# Patient Record
Sex: Female | Born: 1955 | Race: White | Hispanic: No | Smoking: Former smoker
Health system: Southern US, Academic
[De-identification: ages and names within clinical notes are randomized; demographics above are authoritative.]

## PROBLEM LIST (undated history)

## (undated) DIAGNOSIS — R269 Unspecified abnormalities of gait and mobility: Secondary | ICD-10-CM

## (undated) DIAGNOSIS — M2669 Other specified disorders of temporomandibular joint: Secondary | ICD-10-CM

## (undated) DIAGNOSIS — M353 Polymyalgia rheumatica: Secondary | ICD-10-CM

## (undated) DIAGNOSIS — K8689 Other specified diseases of pancreas: Secondary | ICD-10-CM

## (undated) DIAGNOSIS — K589 Irritable bowel syndrome without diarrhea: Secondary | ICD-10-CM

## (undated) DIAGNOSIS — M797 Fibromyalgia: Secondary | ICD-10-CM

## (undated) DIAGNOSIS — E559 Vitamin D deficiency, unspecified: Secondary | ICD-10-CM

## (undated) DIAGNOSIS — R5383 Other fatigue: Secondary | ICD-10-CM

## (undated) DIAGNOSIS — Z9889 Other specified postprocedural states: Secondary | ICD-10-CM

## (undated) DIAGNOSIS — E782 Mixed hyperlipidemia: Secondary | ICD-10-CM

## (undated) DIAGNOSIS — G8929 Other chronic pain: Secondary | ICD-10-CM

## (undated) DIAGNOSIS — E871 Hypo-osmolality and hyponatremia: Secondary | ICD-10-CM

## (undated) DIAGNOSIS — F411 Generalized anxiety disorder: Secondary | ICD-10-CM

## (undated) DIAGNOSIS — M1990 Unspecified osteoarthritis, unspecified site: Secondary | ICD-10-CM

## (undated) DIAGNOSIS — K63829 Intestinal methanogen overgrowth, unspecified: Secondary | ICD-10-CM

## (undated) DIAGNOSIS — K219 Gastro-esophageal reflux disease without esophagitis: Secondary | ICD-10-CM

## (undated) DIAGNOSIS — R531 Weakness: Secondary | ICD-10-CM

## (undated) HISTORY — DX: Mixed hyperlipidemia: E78.2

## (undated) HISTORY — PX: ADENOIDECTOMY: SUR15

## (undated) HISTORY — DX: Generalized anxiety disorder: F41.1

## (undated) HISTORY — DX: Polymyalgia rheumatica: M35.3

## (undated) HISTORY — DX: Weakness: R53.1

## (undated) HISTORY — PX: HX TUBAL LIGATION: SHX77

## (undated) HISTORY — PX: DENTAL SURGERY: SHX609

## (undated) HISTORY — DX: Vitamin D deficiency, unspecified: E55.9

## (undated) HISTORY — DX: Other specified disorders of temporomandibular joint: M26.69

## (undated) HISTORY — PX: HX GALL BLADDER SURGERY/CHOLE: SHX55

## (undated) HISTORY — DX: Hypo-osmolality and hyponatremia: E87.1

## (undated) HISTORY — PX: HX TONSILLECTOMY: SHX27

## (undated) HISTORY — PX: COLONOSCOPY: SHX174

## (undated) HISTORY — DX: Other fatigue: R53.83

## (undated) HISTORY — PX: SINUS SURGERY: SHX187

## (undated) HISTORY — DX: Unspecified osteoarthritis, unspecified site: M19.90

## (undated) HISTORY — PX: BREAST SURGERY: SHX581

## (undated) HISTORY — DX: Other specified postprocedural states: Z98.890

## (undated) HISTORY — PX: HX HIP REPLACEMENT: SHX124

## (undated) HISTORY — PX: HX CATARACT REMOVAL: SHX102

## (undated) HISTORY — PX: HX APPENDECTOMY: SHX54

## (undated) HISTORY — DX: Gastro-esophageal reflux disease without esophagitis: K21.9

## (undated) HISTORY — DX: Unspecified abnormalities of gait and mobility: R26.9

## (undated) HISTORY — DX: Irritable bowel syndrome, unspecified: K58.9

---

## 1997-01-27 ENCOUNTER — Other Ambulatory Visit (HOSPITAL_COMMUNITY): Payer: Self-pay | Admitting: OBSTETRICS/GYNECOLOGY

## 2015-08-03 IMAGING — CR XRAY CERVICAL SPINE MINIMUM 4 VIEWS
1 series · 7 of 7 positions shown · non-contrast
Comparison: none

Exam:   

Cervical spine 5V
INDICATION: Neck pain.

[Series 4: view not recorded · 0.17mm/px · 7 of 7 slices shown]
[im 1/7]
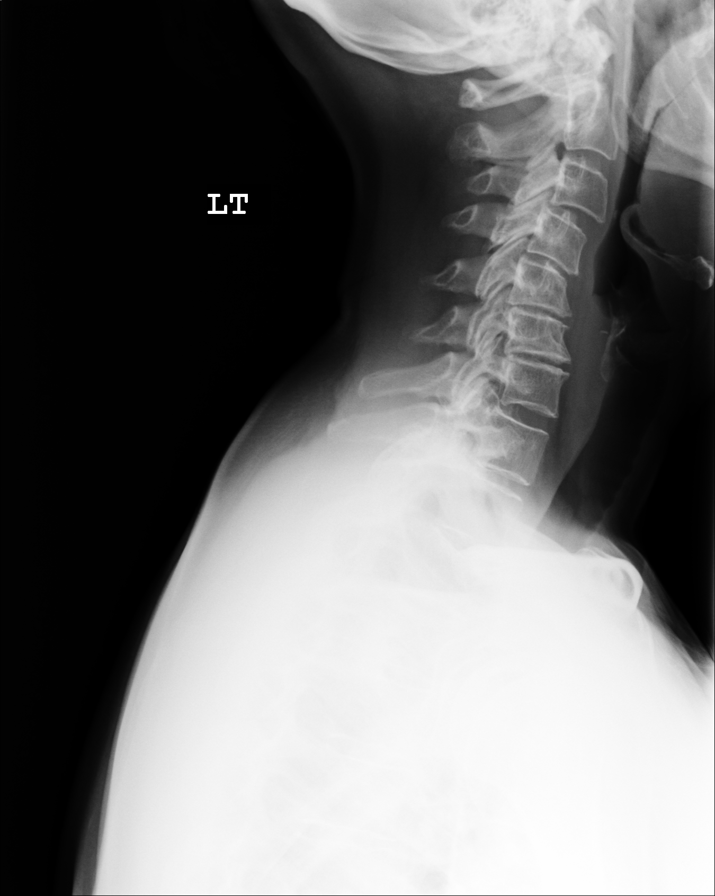
[im 2/7]
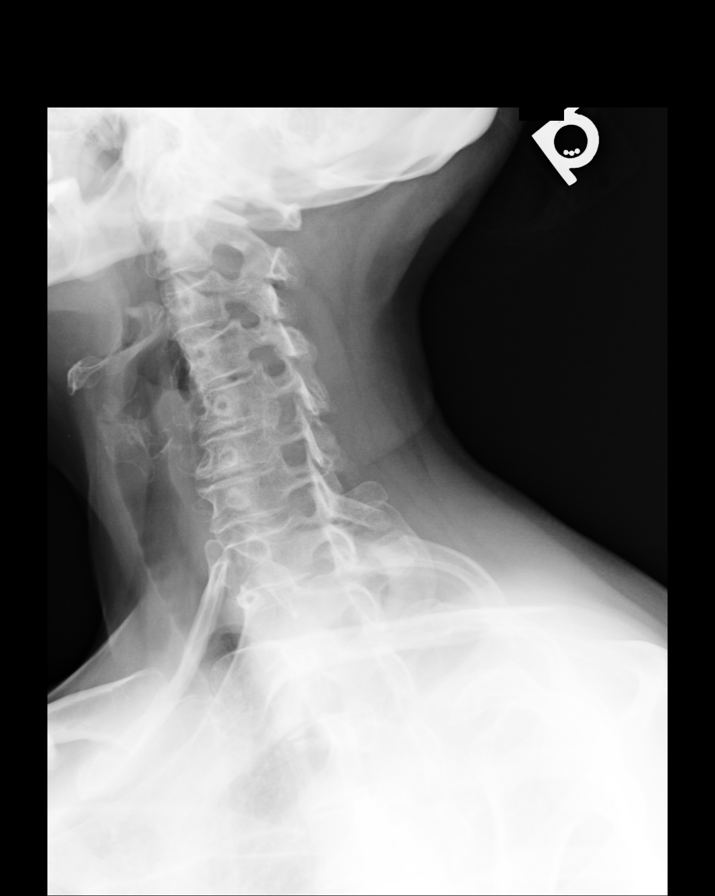
[im 3/7]
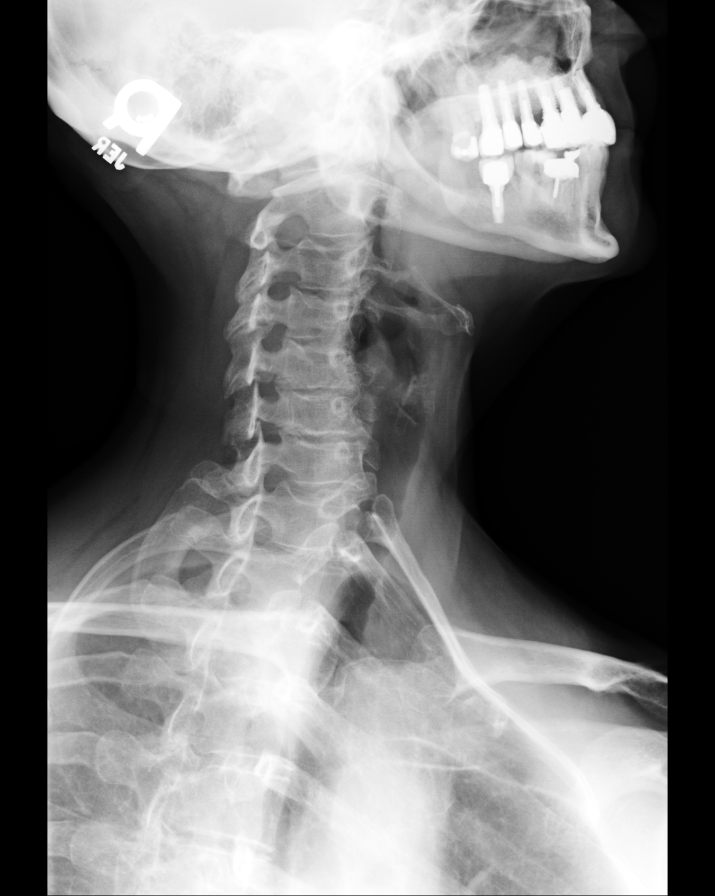
[im 4/7]
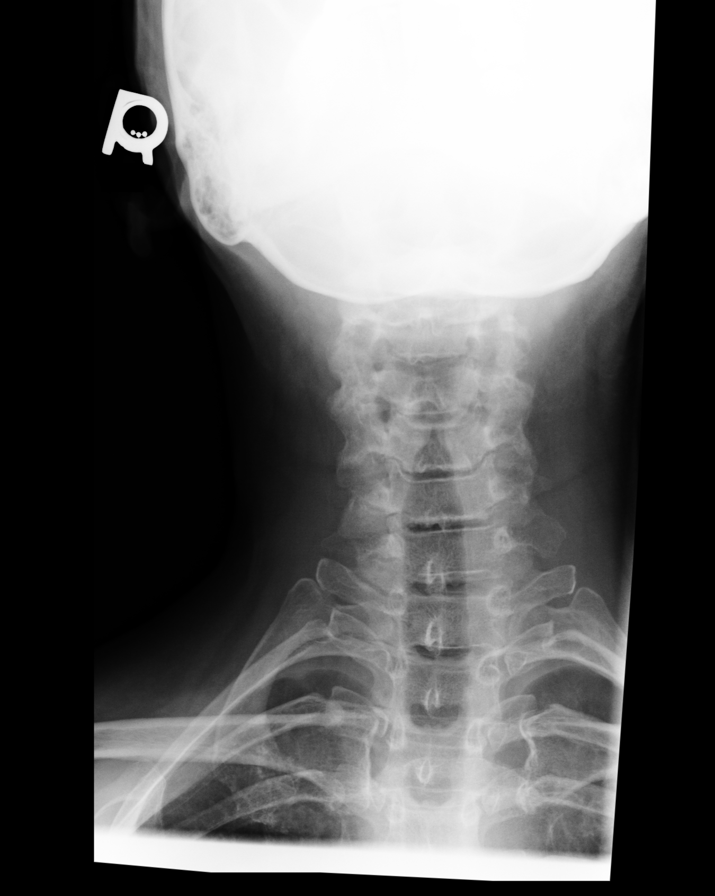
[im 5/7]
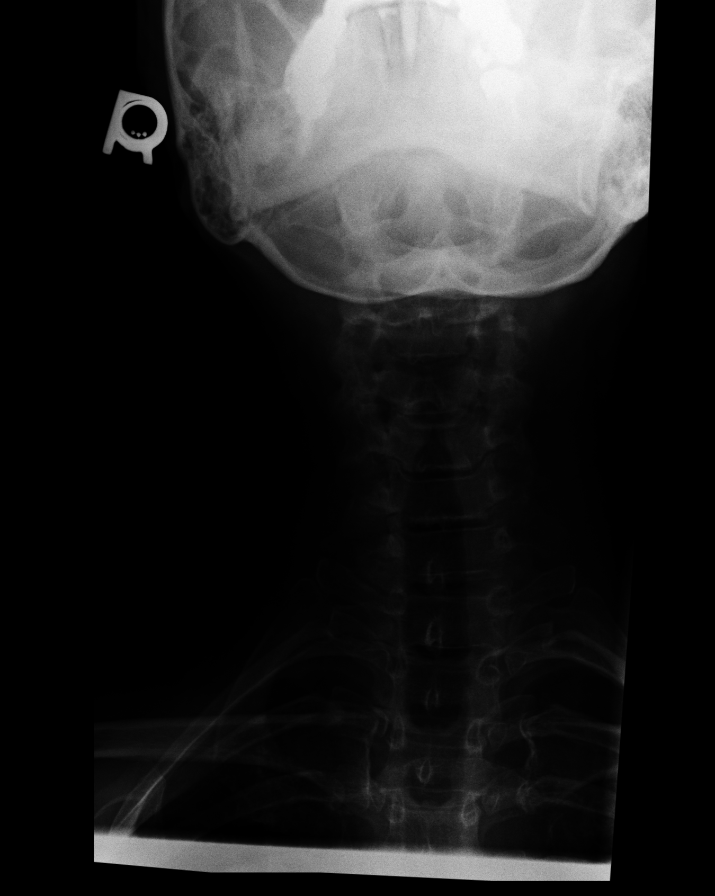
[im 6/7]
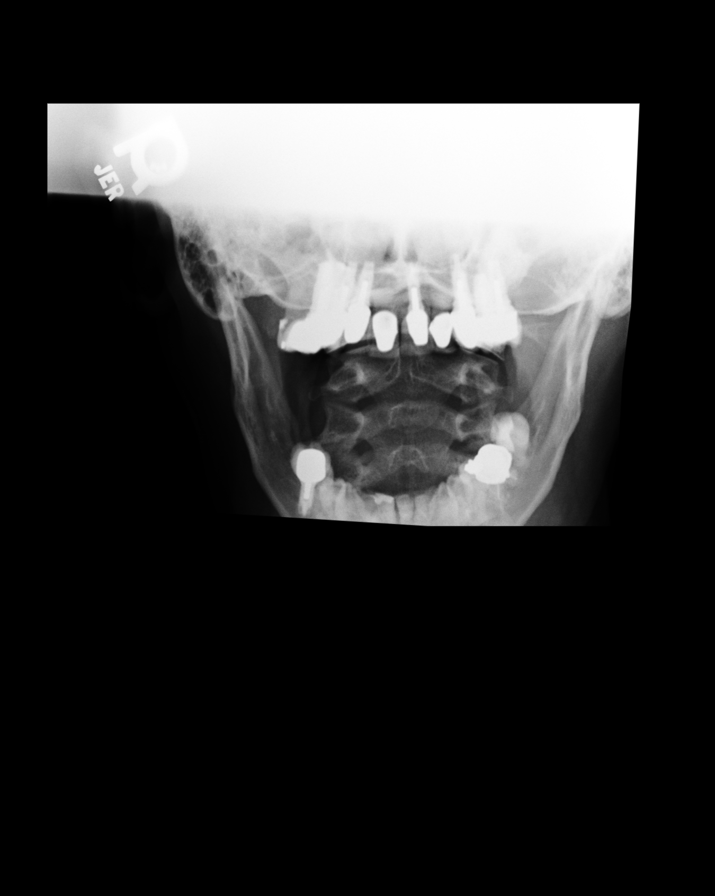
[im 7/7]
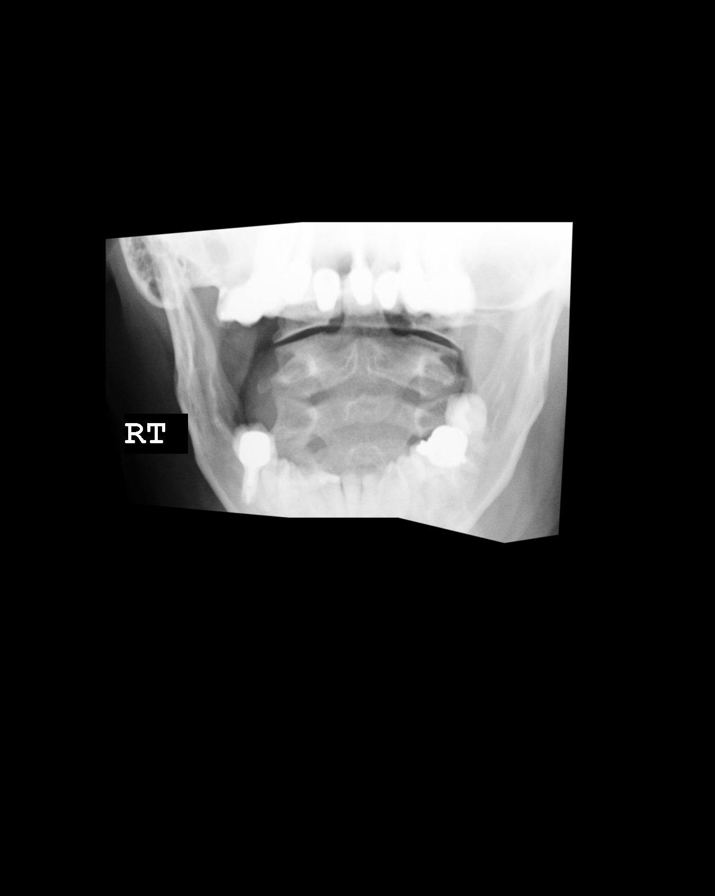

[7 of 7 positions shown; findings below may reference images not displayed]

FINDINGS: Multiple views of the patient’s cervical spine shows on lateral projection all seven cervical vertebrae. There is reversal of curvature of the cervical spine from C3 to C7. Disc space narrowing at C4-5, C5-6 and C6-7 is observed. There is end plate sclerosis with hypertrophic bone formation at the discovertebral margins. No evidence for a fracture is observed. No prevertebral soft tissue swelling is seen. Oblique views shows no evidence for a locked facet. No significant osteophytes are seen impinging the cervical foramina. The anterior posterior views shows C1 centered on C2.
IMPRESSION: Reversal of curvature C3 through C7.

Disc space narrowing C4-5. C5-6 and C6-7.

No fracture or subluxation. 

No evidence for a locked facet.

## 2018-02-01 NOTE — Patient Instructions (Signed)
 Take one Lactaid pill when you eat any dairy products.    Take on PEPCID Complete as needed for acid

## 2018-10-15 NOTE — Telephone Encounter (Signed)
 Would offer repeat HBT as discussed by Dr. Dena.   If positive, happy to treat as she was previously.   Otherwise can get her in to be seen.   Just let me know and happy to order if she desires.    Thanks!     Electronically signed by: Emeline Norleen Speck, MD  10/15/18 [phone]

## 2018-10-16 NOTE — Telephone Encounter (Signed)
 Ok.  Thank you.   I placed the order.   Thanks      Electronically signed by: Emeline Norleen Speck, MD  10/16/18 1141

## 2018-11-25 NOTE — Progress Notes (Signed)
 Delon,   Can you please let her know the HBT was positive and I sent flagyl  and neomycin  to pharmacy.   Thanks  Technical sales engineer by: Emeline Norleen Speck, MD, Attending Physician  11/25/2018 9:59 AM      Electronically signed by: Emeline Norleen Speck, MD  11/25/18 [phone]

## 2018-11-25 NOTE — Telephone Encounter (Signed)
 Noted.   I do see prior 30 day rx  Sent in a refill on 14d rx that she can continue to do a 4 week course.   Follow-up in 2-3 months should be adequate. With APP should be reasonable as well.  I've never seen her - former Dena pt.      Electronically signed by: Emeline Norleen Speck, MD  11/25/18 [phone]

## 2019-02-11 NOTE — Progress Notes (Signed)
 GI Clinic Return Patient Visit  CC: SIBO     HPI:  Sonya Parks is a 64 y.o. year old female who is seen in follow-up for SIBO.     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 Noted ~50% improvement in SIBO symptoms s/p use with some continued minimal improvement since that time. Taking gas-x and noted that she has gone back to an extremely strict diet with some improvement in these symptoms. No red meat, no processed foods, no dairy. Reports she is still having deep belching, abdominal bloating and flatulence. States with bloating she notes some gas pain in lower abdomen. Endorses good appetite levels.Patient states they are having 3-5 bowel movements daily, bristol 5-7. Patient denies hematochezia, melena, steatorrhea, nausea, vomiting, dysphagia or unintentional weight loss.  States she is trying to make sure bowels stay open no matter what. - taking Milk of Mg nightly currently. Patient states prior to this - ie: last year- she was having formed but hard stools that were difficult to produce. Previous trials of linzess and amitiza. Notes she did pretty well on Amitiza but she had a change in insurance so needed to transition to Linzess. Had significant diarrhea with this. Miralax without benefit. No previous consistent trial of soluble fiber. Patient was previously on PPI and QHS Pepcid. States she self-discontinued PPI after reading AEs. States she will have intermittent reflux occurring once every 4-6 weeks. States this is responsive to mylanta. Continues to take Pepcid QHS with benefit in chronic symptoms. Also reports historical benefit in SIBO symptoms with intermittent ABX given for dental issues (Clindamycin ) - inquires into longer term erythromycin  dosing for SIBO therapy given research she has done on treatment by specialist in [place] .     Flat red dots that she has noted on and off starting ~2 years ago.  States that most recently came back In November prior to last ABX course for SIBO. Notes these occur on her distal extremities. States these do not itch. Will have episodes of bleeding when shaving.  Also reports this spotting within her mouth in addition to a large white spot on side of tongue. Reports dry mouth, eyes and throat.     Patient reports that her last colonoscopy was ~2010 but noted that they were unable to complete this 2/2 inability to get in. this was at an OSH - report is not available for review. Notes from Dr. Dena mention this was negative - will request for review. Patient denies any known family history of GI diseases or malignancies. She denies tobacco use. Denies alcohol .     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    Other:   HBT 11/20/18: Diagnostic for small intestinal bacterial overgrowth in a patient colonized with methanogenic bacteria.  HBT 12/20/17: neg  HBT 10/08/17: Diagnostic for severe bacterial overgrowth with methanogenic bacteria.  HBT 06/20/17: Diagnostic for bacterial overgrowth in a patient colonized with methanogenic bacteria.    ROS:  A complete review of systems was otherwise negative, except as noted in the HPI.    PMH:  Past Medical History:   Diagnosis Date   . Disorder of sphincter of Oddi    . GERD (gastroesophageal reflux disease)    . Irritable bowel syndrome    . Small intestinal bacterial  overgrowth        PSH:   Past Surgical History:   Procedure Laterality Date   . APPENDECTOMY     . BREAST IMPLANT PLACEMENT     . BREAST IMPLANT REMOVAL     . CHOLECYSTECTOMY     . DENTAL SURGERY      DENTAL IMPLANTS   . OVARIAN CYST SURGERY     . TONSILLECTOMY AND ADENOIDECTOMY          SOCIAL:  Social History     Socioeconomic History   . Marital status: Legally Separated     Spouse name: None   . Number of children: None   . Years of education: None   . Highest education level: None   Occupational History   . None   Social Needs   . Financial resource strain: None   . Food insecurity     Worry: None     Inability: None   . Transportation needs     Medical: None     Non-medical: None   Tobacco Use   . Smoking status: Former Smoker     Packs/day: 0.50     Years: 30.00     Pack years: 15.00     Types: Cigarettes     Quit date: 10/21/2003     Years since quitting: 15.3   . Smokeless tobacco: Never Used   Substance and Sexual Activity   . Alcohol  use: No   . Drug use: No   . Sexual activity: None   Lifestyle   . Physical activity     Days per week: None     Minutes per session: None   . Stress: None   Relationships   . Social Wellsite geologist on phone: None     Gets together: None     Attends religious service: None     Active member of club or organization: None     Attends meetings of clubs or organizations: None     Relationship status: None   Other Topics Concern   . None   Social History Narrative   . None       FH: No GI or liver disease.   family history includes Cholecystitis in her mother; Osteoarthritis in her maternal grandmother and mother; Thyroid  disease in her daughter, maternal aunt, and paternal aunt.    MEDS:  Updated Medication List:          acetaminophen  (TYLENOL ) 650 MG CR tablet    Sig - Route: Take 1,300 mg by mouth every 8 (eight) hours as needed for Pain. - Oral    Class: Historical Med    AMABELZ  0.5-0.1 mg per tablet    Sig: daily.    Class: Historical Med  aspirin 81 MG EC tablet     Sig - Route: Take 81 mg by mouth daily. - Oral    Class: Historical Med    buPROPion  XL (WELLBUTRIN  XL) 300 MG 24 hr tablet    Sig: TAKE 1 TABLET BY MOUTH IN THE MORNING    Class: Historical Med    calcium carbonate (CALTRATE 600 ORAL)    Sig - Route: Take by mouth daily. - Oral    Class: Historical Med    celecoxib  (CELEBREX ) 200 MG capsule    Sig: TAKE 1 CAPSULE BY MOUTH ONCE DAILY    Class: Historical Med    chlorhexidine (PERIDEX) 0.12 % solution    Sig:      Class: Historical Med    cholecalciferol (VITAMIN D3) 1000 UNIT Tab    Sig - Route: Take 2,000 Units by mouth daily. - Oral    Class: Historical Med    cyclobenzaprine  HCl (CYCLOBENZAPRINE  ORAL)    Sig - Route: Take by mouth as needed. - Oral    Class: Historical Med    diclofenac (VOLTAREN) 1 % Gel gel    Sig - Route: Apply topically 4 times daily. - Topical    Class: Historical Med    DULoxetine  (CYMBALTA ) 60 MG capsule    Sig: daily.    Class: Historical Med    fluticasone propionate (FLONASE) 50 mcg/actuation nasal spray    Sig - Route: 1 spray by Nasal route daily. - Nasal    Class: Historical Med    LORazepam (ATIVAN) 0.5 MG tablet    Sig - Route: Take 0.5 mg by mouth as needed for Anxiety. - Oral    Class: Historical Med    mag-alum hydroxide-simethicone  (MAALOX PLUS EXTRA STRENGTH) 400-400-40 mg/5 mL suspension    Sig - Route: Take 5 mLs by mouth as needed. - Oral    Class: Historical Med    magnesium hydroxide (MILK OF MAGNESIA) 800 mg/5 mL suspension    Sig - Route: Take 5 mLs by mouth daily. - Oral    Class: Historical Med    meloxicam (MOBIC) 15 MG tablet    Sig - Route: Take 15 mg by mouth daily. Half tablet twice daily - Oral    Class: Historical Med    olopatadine 0.2 % Drop    Sig: INSTILL 1 DROP INTO EACH EYE IN THE MORNING    Class: Historical Med    pantoprazole  (PROTONIX ) 20 MG tablet    Sig: TAKE 1 TABLET BY MOUTH ONCE DAILY    Class: Historical Med    prednisoLONE acetate (PRED FORTE) 1 % ophthalmic suspension    Sig: INSTILL 1 DROP  INTO RIGHT EYE TWICE DAILY    Class: Historical Med    spironolactone (ALDACTONE) 50 MG tablet    Sig: daily.    Class: Historical Med    ubidecarenone (CO Q-10 ORAL)    Sig - Route: Take by mouth daily. - Oral    Class: Historical Med    XIIDRA  5 % ophthalmic solution    Sig:      Class: Historical Med            ALLERGIES:  is allergic to honey bee treatment [venom-honey bee]; mustard; and penicillin.    PHYSICAL:  BP 139/84   Pulse 89   Temp 97.9 F (36.6 C) (Temporal)   Ht 1.689 m (5' 6.5)   Wt 54.5 kg (120 lb 3.2 oz)   SpO2 100%   BMI 19.11  kg/m    General:  Sitting in chair in NAD  Eyes: EOMI, no scleral icterus  ENT: white plaque on L lateral tongue, OP clear  Cardiovascular:  Regular rate, no murmurs, no edema  Respiratory:  Clear to auscultation bilaterally, no wheezes or crackles, normal work of breathing  Abdomen:  Soft, nondistended, normoactive bowel sounds, tympanic to percussion  Skin/MSK: discrete, circumferential, red dotted rash measuring 3-10cm noted on all distal extremities, no jaundice, no CVA tenderness  Neuro:  alert and oriented x 3, no asterixis  Psych: pleasant affect, appropriate judgement    ASSESSMENT AND PLAN:   Kelse Ploch is a 64 y.o. 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 Constipation currently poorly managed via daily milk of Mg which could be contributing to underlying bloating symptoms. Will attempt to fill Rx fo Amitiza given historical benefit and AEs noted with Linzess - if insurance won't cover will trial Trulance. Will obtain repeat HBT for possible SIBO recurrence. Patient's rash unclear - appears she has had some autoimmune lab evaluations which have been unremarkable. Will obtain glucagon. Also recommended repeat colonoscopy as she is overdue for CRC screening. Plan is discussed with patient who expresses understanding and agreement. Risks, benefits  and alternatives to plan are discussed. All questions are answered.     PLAN:   - Amitiza 8mg    - Glucagon lab  - HBT  - Colonoscopy for CRC screening     RTC in 3 months     I spent 39 minutes total in relation to chart review, documentation and face-to-face interaction for today's visit. Greater than 50% of the patient encounter involved counseling on follow up plan, return instructions, risk factor reduction and patient and family eduction.    Electronically signed by:  Saddie Earnie Cole, PA-C, 02/11/2019 10:20 AM    Attending Physician Statement  I saw and evaluated Channing Leeroy [place] with Saddie Cole, PA-C.  I personally reviewed key points of the history and exam with the patient.  I have discussed the patient's management with [name], and I played a major role in the medical decision making.  I agree with history, exam, assessment and plan as written by [name].    Briefly, Ms. Oatis is a 64 y.o. female with history of IMO status post multiple antibiotic regimens including 4-week therapy of Flagyl  and neomycin  presenting in return.  She has ongoing mild bloating, significant constipation, and a skin rash.  We discussed her need for screening colonoscopy, and she agreed to proceed and we will order this today.  We also discussed repeat hydrogen breath testing to confirm eradication after her recent antibiotic regimen.  Recommend Amitiza initiation given suggestion that this may be concomitant irritable bowel syndrome with constipation.  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  If no etiology determined of skin rash on these tests, will refer to dermatology.             Electronically Signed by: Emeline Norleen Speck, MD, Attending Physician  02/11/2019 1:17 PM         Electronically signed by: Emeline Norleen Speck,  MD  02/11/19 1321

## 2019-03-27 NOTE — Telephone Encounter (Signed)
 Thanks!     Electronically signed by: Emeline Norleen Speck, MD  03/27/19 2146

## 2019-04-21 NOTE — Telephone Encounter (Signed)
 Ok.  Thank you.   Will place order for HBT.   Re food intolerances - I dont do that testing, and the data supporing them is not fantastic so I generally try to stay away from it.  Would check for SIBO first (with HBT) and we can discuss food intolerances and strategies to find triggers at our visit in May.    Thanks!     Electronically signed by: Emeline Norleen Speck, MD  04/21/19 1023

## 2019-05-20 NOTE — Patient Instructions (Signed)
 EGD and colonoscopy when able  Enteric coated peppermint oil - IBgard is OTC therapy  Discuss food sensitivities with dietary  Continue amitiza  Anorectal manometry to evaluate for outlet dysfunction as etiology of constipation.

## 2019-05-20 NOTE — Progress Notes (Signed)
 Gastroenterology Clinic Return Visit  HPI:  Sonya Parks is a 64 y.o. year old female who is seen in return for IMO/SIBO.  Most recently seen February 2021 (previous to this saw Dr. Dena) at which point she was status post multiple prior antibiotic regimens including Flagyl  and neomycin  for 4 weeks.  At that time she had ongoing bloating, constipation, and rash.  We suggested repeat HBT to confirm eradication and initiation of Amitiza given concomitant IBS-C.  Checked glucagon (normal) to exclude necrolytic migratory erythema from an occult glucagonoma and celiac studies were negative.  After her visit, her Amitiza was increased to 24 mg twice daily.  Call back in early April with bloating and belching with gas and fatigue.  She had a breath test performed yesterday but results are not available.    Today she has several concerns and reports that she is in many ways well, in some ways not so well.  She states she has been managing things better and tolerating a very restrictive diet.  She has taken Amitiza 24 mg twice daily and occasionally added an extra 8 mill to produce a bowel movement.  Despite this, she still has complete evacuation and less than ideal bowel habits.  She has tried to eat less fiber and also has performed intermittent fasting.  She did feel somewhat better after Cipro but had multiple side effects of this medication.  She still has bloating and distention with abdominal cramping.  She has no she has pain in her back when her constipation gets bad.  She also has a burning sensation in her epigastrium which radiates to her back prior to a bowel movement.  She has not had any blood in her stools or she had black stools.  She reports she may have had less brain fog with Cipro, but this did worsen her sciatica.  She has some benefit with Gas-X with regarding to her eructation and bloating.  Overall, she states her worst symptom is fatigue.  She has not been taking Protonix  but has been  taking Pepcid nightly.  She also asked about erythromycin  and the potential for this to help with bacterial overgrowth.    Exam:   BP 118/75   Pulse 92   Temp 97.3 F (36.3 C) (Temporal)   Ht 1.689 m (5' 6.5)   Wt 53.5 kg (118 lb)   SpO2 100%   BMI 18.76 kg/m      Alert and oriented x3, no acute distress  Female  Regular rate and rhythm  Soft nontender abdomen with mild tympany in epigastrium noted.  No lower extremity edema  No appreciable rashes    Assessment and Plan:  Sonya Parks is a 64 y.o. female with history of SIBO/IMO and IBS-C who presents in return for evaluation of bloating, distention, constipation, reflux, and fatigue.  We discussed contiuation of her Pepcid for reflux sypmtoms.  Would also consider EGD given upper abdominal pain and dyspepsia.  She is due for screening colonoscopy and this will help exclude obstructive obstructive pathology for constipation.  We will also pursue anorectal manometry given suggestion of pelvic floor dysfunction as well.  She will continue her current dose of Amitiza 24 mg twice daily and recommend that she start peppermint oil addition.  Given her multiple perceived food allergies, recommend she discuss with dietitian regarding ensuring she does not being too restrictive and becoming nutritionally deficient.  We also discussed food allergy testing and the lack of data behind this.  We also discussed my hesitancy with prescribing erythromycin  as she does not have documented gastroparesis number she had particular symptoms distant with delayed gastric emptying.  Will follow results of her HBT though we discussed my hesitation that the HBT was providing effective diagnostic results and if this was an etiology or a sequale of her symptoms.     This note was dictated in part with with voice recognition software.   Total time spent on the date of service was 7 pre chart/45 face to face/8 charting - 60 minutes.    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    Other:   HBT 03/03/19:  Increased hydrogen production to 12 ppm over baseline at 60 minutes with a secondary peak 17 ppm over baseline at 105 minutes. No significant change in methane production. Although this level of gas production does not reach the diagnostic cutoff for bacterialovergrowth of . 20 ppm over baseline at or before 1 hour, it suggests increased foregut bacterial colonization.  HBT 11/20/18: Diagnostic for small intestinal bacterial overgrowth in a patient colonized with methanogenic bacteria.  HBT 12/20/17: neg  HBT 10/08/17: Diagnostic for severe bacterial overgrowth with methanogenic bacteria.  HBT 06/20/17: Diagnostic for bacterial overgrowth in a patient colonized with methanogenic bacteria.    Labs  Heme labs:  Lab Results   Component Value Date    WBC 6.2 01/17/2018    HGB 15.9 01/17/2018    HCT 45.6 01/17/2018    MCV 94.9 01/17/2018    PLT 272 01/17/2018     No results found for: B12B  No results found for: IRON , TIBC, FERRITIN    Liver labs:  Lab Results   Component Value Date    ALT 41 01/17/2018    AST 29 01/17/2018    ALKPHOS 49 01/17/2018    BILITOT 0.5 01/17/2018     Lab Results   Component Value Date    HEPCAB Non-Reactive 01/17/2018     No results found for: INR, PROTIME    Misc labs:  No results found for: HBA1C, HGBA1C, PA1C  TISSUE TRANSGLUTAMINASE IGA ANTIBODY   Date Value Ref Range Status   02/11/2019 <1.2 <4.0 (Negative) U/mL Final     Comment:        Test Performed by:  [facility]  [address] [place], [place], [place] [address]  Lab Director: Elsie JUDITHANN Cumming M.D. Ph.D.; CLIA# [id number]     IGA   Date Value Ref Range Status   02/11/2019 85 70 - 350 MG/DL Final          Electronically signed by: Emeline Norleen Speck, MD  05/20/19 1154

## 2019-06-12 NOTE — Telephone Encounter (Signed)
 Thank you!!  -Jared     Electronically signed by: Emeline Norleen Speck, MD  06/12/19 [phone]

## 2019-06-16 NOTE — H&P (Addendum)
 Gastroenterology Preprocedural History and Physical        Chief Complaint/Reason for Procedure:  Sonya Parks is a 64 y.o. female scheduled for an EGD and Colonoscopy, for the following indication colon cancer screening, abdominal pain, gerd, bloating using deep sedation with propofol or general anesthesia as per anesthesia provider .    A History and Physical has been performed and patient medication allergies have been reviewed. The patient's tolerance of previous anesthesia has been reviewed. The risks and benefits of the procedure and the sedation options and risks were discussed with the patient. All questions were answered and informed consent obtained.  HPI  Patient Active Problem List    Diagnosis Date Noted   . SOB (shortness of breath) 10/18/2015   . Abdominal pain 10/21/2010     Past Medical History:   Diagnosis Date   . Disorder of sphincter of Oddi    . GERD (gastroesophageal reflux disease)    . Irritable bowel syndrome    . Small intestinal bacterial overgrowth       Past Surgical History:   Procedure Laterality Date   . APPENDECTOMY     . BREAST IMPLANT PLACEMENT     . BREAST IMPLANT REMOVAL     . CHOLECYSTECTOMY     . DENTAL SURGERY      DENTAL IMPLANTS   . OVARIAN CYST SURGERY     . TONSILLECTOMY AND ADENOIDECTOMY         BP 150/84   Pulse 89   Temp 97.7 F (36.5 C) (Skin)   Resp 14   Ht 1.676 m (5' 6)   Wt 56.7 kg (125 lb)   SpO2 96%   BMI 20.18 kg/m   Airway:  MALLAMPATI ONE    Heart:  normal S1 and S2  Lungs:  clear  Abdomen:  Positive bowel sounds. Abdomen soft, nondistended, nontender. No HSM, rebound or guarding.  Mental Status:  awake and alert; oriented to person, place, and time        Allergies   Allergen Reactions   . Honey Bee Treatment [Venom-Honey Bee] Swelling (ALLERGY/intolerance)   . Mustard Other (See Comments)     Swollen throat    . Penicillin Rash (ALLERGY/intolerance)     Prior to Admission medications    Medication Sig Start Date End Date Taking? Authorizing  Provider   AMABELZ  0.5-0.1 mg per tablet Take 1 tablet by mouth daily.    03/15/17  Yes Historical Provider, MD   aspirin 81 MG EC tablet Take 81 mg by mouth daily.   Yes Historical Provider, MD   buPROPion  XL (WELLBUTRIN  XL) 300 MG 24 hr tablet TAKE 1 TABLET BY MOUTH IN THE MORNING 08/23/18  Yes Historical Provider, MD   celecoxib  (CELEBREX ) 200 MG capsule Take 200 mg by mouth 3 times daily.    08/23/18  Yes Historical Provider, MD   cholecalciferol, vitamin D3, (VITAMIN D3) 50 mcg (2,000 unit) Tab tablet Take 2,000 Units by mouth daily.   Yes Historical Provider, MD   diclofenac (VOLTAREN) 1 % Gel gel Apply 2 g topically nightly.      Yes Historical Provider, MD   DULoxetine  (CYMBALTA ) 30 MG DR capsule Take 30 mg by mouth 3 times daily.   Yes Historical Provider, MD   famotidine (PEPCID) 20 MG tablet Take 20 mg by mouth at bedtime.    02/02/19  Yes Historical Provider, MD   fluticasone propionate (FLONASE) 50 mcg/actuation nasal spray 1 spray by Each Nare route  as needed for Rhinitis or Allergies.      Yes Historical Provider, MD   lovastatin  (MEVACOR ) 10 MG tablet TAKE 1 TABLET BY MOUTH ONCE DAILY 12/17/18  Yes Historical Provider, MD   lubiprostone (AMITIZA) 24 MCG capsule Take 1 capsule (24 mcg total) by mouth 2 times daily with meals. 06/04/19 09/02/19 Yes Emeline Norleen Speck, MD   lubiprostone (AMITIZA) 8 MCG capsule Take 1 capsule (8 mcg total) by mouth 2 times daily with meals. In addition to the 24 mcg capsule 06/12/19 09/10/19 Yes Emeline Norleen Speck, MD   mag-alum hydroxide-simethicone  (MAALOX PLUS EXTRA STRENGTH) 400-400-40 mg/5 mL suspension Take 5 mLs by mouth as needed.   Yes Historical Provider, MD   magnesium hydroxide (MILK OF MAGNESIA) 800 mg/5 mL suspension Take 5 mLs by mouth nightly.      Yes Historical Provider, MD   olopatadine 0.2 % Drop INSTILL 1 DROP INTO EACH EYE IN THE MORNING 01/18/18  Yes Historical Provider, MD   simethicone  (GAS-X ORAL) Take by mouth daily.   Yes Historical Provider, MD    spironolactone (ALDACTONE) 50 MG tablet Take 50 mg by mouth daily.    01/20/17  Yes Historical Provider, MD   XIIDRA  5 % ophthalmic solution Place 1 drop into both eyes 2 times daily.    02/15/18  Yes Historical Provider, MD   acetaminophen  (TYLENOL ) 650 MG CR tablet Take 1,300 mg by mouth every 8 (eight) hours as needed for Pain.    Historical Provider, MD   calcium carbonate (CALTRATE 600 ORAL) Take by mouth twice a week.       Historical Provider, MD   LORazepam (ATIVAN) 0.5 MG tablet Take 0.5 mg by mouth as needed for Anxiety.    Historical Provider, MD   meloxicam (MOBIC) 15 MG tablet Take 7.5 mg by mouth 2 (two) times daily as needed for Pain.       Historical Provider, MD   pantoprazole  (PROTONIX ) 20 MG tablet TAKE 1 TABLET BY MOUTH ONCE DAILY 08/23/18   Historical Provider, MD   ubidecarenone (CO Q-10 ORAL) Take by mouth daily.    Historical Provider, MD     Family History of:   Details   Colon Cancer   No    Inflammatory Bowel Disease No    Liver Disease   No      Social History     Socioeconomic History   . Marital status: Divorced     Spouse name: Not on file   . Number of children: Not on file   . Years of education: Not on file   . Highest education level: Not on file   Occupational History   . Not on file   Tobacco Use   . Smoking status: Former Smoker     Packs/day: 0.50     Years: 30.00     Pack years: 15.00     Types: Cigarettes     Quit date: 10/21/2003     Years since quitting: 15.6   . Smokeless tobacco: Never Used   Substance and Sexual Activity   . Alcohol  use: No   . Drug use: No   . Sexual activity: Not on file   Other Topics Concern   . Not on file   Social History Narrative   . Not on file     Social Determinants of Health     Financial Resource Strain:    . Difficulty of Paying Living Expenses:    Food Insecurity:    .  Worried About Programme researcher, broadcasting/film/video in the Last Year:    . Barista in the Last Year:    Transportation Needs:    . Freight forwarder (Medical):    SABRA Lack of  Transportation (Non-Medical):    Physical Activity:    . Days of Exercise per Week:    . Minutes of Exercise per Session:    Stress:    . Feeling of Stress :    Social Connections:    . Frequency of Communication with Friends and Family:    . Frequency of Social Gatherings with Friends and Family:    . Attends Religious Services:    . Active Member of Clubs or Organizations:    . Attends Banker Meetings:    SABRA Marital Status:             Review of Systems  Fatigue, weight gain, weight loss, cold intolerance, excessive thirst, frequent urination, leakage of urine, back pain, muscle pain, joint pain, joint swelling, walking difficulty, weakness, easy bruising, hair loss, bloating, choking, nausea or vomiting, anxiety, and depression.     Vital Signs:  Temp:  [97.7 F (36.5 C)] 97.7 F (36.5 C)  Pulse:  [89] 89  Resp:  [14] 14  BP: (150)/(84) 150/84  SpO2:  [96 %] 96 %  BP 150/84   Pulse 89   Temp 97.7 F (36.5 C) (Skin)   Resp 14   Ht 1.676 m (5' 6)   Wt 56.7 kg (125 lb)   SpO2 96%   BMI 20.18 kg/m       ASA Grade Assessment: ASA 2 - Patient with mild systemic disease with no functional limitations     Electronically signed by: Emeline Norleen Speck, MD 06/16/2019 11:33 AM     Electronically signed by: Emeline Norleen Speck, MD  06/16/19 1134       Electronically signed by: Emeline Norleen Speck, MD  06/16/19 1135

## 2019-07-29 NOTE — Progress Notes (Signed)
 Gastroenterology Clinic Return Visit  HPI:  Sonya Parks is a 64 y.o. year old female who is seen in return for IMO/SIBO. Since her last visit here, patient underwent repeat HBT which was neg. Had EGD/Colon in June. EGD normal. Colonoscopy incomplete 2/2 inadequate prep with need for different screening modality. Since colonoscopy, patient reports that she has changed her bowel regimen. She is no longer taking linzess. States that this lead to nausea and was overall ineffective in helping with her constipation. States she is currently using milk of Mg. Patient states on this regimen she is having about 3 bowel movements weekly that are soft and formed. Patient denies diarrhea, hematochezia, melena, steatorrhea, or unintentional weight loss. Notes some continued mild abdominal bloating that is worse during periods of worse constipation but states this is overall much improved with improvement in bowel regularity. States she is no longer having significant abdominal cramping. She just picked up miralax and was thinking of adding this as adjunct therapy. Patient reports she has not been taking a PPI for some time. Indicates that she has been adherent to use of Pepcid 20mg  nightly after the pharmacy was out of 40mg . On this low dose regimen, she has had no issues or breakthrough symptoms. She denies any heartburn, dysphagia, odynophagia, nausea, vomiting or early satiety. States that she is still on celebrex  - has attempted to taper off - muscle pain improving. Reports that she wishes to taper off her antireflux medications as she tapers off regular NSAID use. Of note, patient has also started seeing a local nutritionist and massage therapist which have also been helpful in symptom palliation.       Exam:   BP 133/75   Pulse 97   Temp 97.7 F (36.5 C) (Temporal)   Ht 1.689 m (5' 6.5)   Wt 52.9 kg (116 lb 9.6 oz)   SpO2 100%   BMI 18.54 kg/m      Alert and oriented x3, no acute distress  Female  Regular  rate and rhythm  Soft nontender abdomen with mild tympany in epigastrium noted.  No lower extremity edema  No appreciable rashes    Assessment and Plan:  Sonya Parks is a 64 y.o. female with history of SIBO/IMO and IBS-C who presents in return for evaluation of bloating, distention, constipation, reflux, and fatigue. Overall doing well with significant improvement in her constipation and bloating on regimen of milk of mg and PRN miralax - will continue. Colonoscopy attempted in June incomplete 2/2 inadequate prep - will need to pursue alternative means for screening. Will order FIT at this time. EGD in June normal. Symptoms controlled on low dose H2RA blocker - consideration for taper as she tapers off regular NSAID use which would be appropriate as long as rebound sx do not occur. Plan is discussed with patient who expresses understanding and agreement. Risks, benefits and alternatives to plan are discussed. All questions are answered.     Electronically signed by:  Saddie Earnie Cole, PA-C, 07/29/2019 8:46 AM    Attending Physician Statement  I saw and evaluated Sonya Parks with Saddie Cole, PA-C.  I personally reviewed key points of the history and exam with the patient.  I have discussed the patient's management with [name], and I played a major role in the medical decision making.  I agree with history, exam, assessment and plan as written by [name].    Briefly, [name] is a 64 y.o. female who is seen today  in return.  She overall feels well and is happy with her progress.  She is now seeing a dietician and we discussed continuing this and also to add miralax to get to 1bm/d.  We also discuss CRC screening options with repeat colonoscopy (with >2d prep), CT colonography, and FIT testing.  All was discussed and ultimately she chose to pursue FIT which I feel is reasonable. This was given to her today in clinic.  RTC 4 months.     Electronically Signed by: Emeline Norleen Speck, MD,  Attending Physician  07/29/2019 9:18 AM      MOST RECENT STUDIES:   Colonoscopy 06/16/19: incomplete bowel prep continue current laxative regimen, add 17g Miralax to daily routine   EGD 06/16/19: Normal EGD.    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    Other:   HBT 05/21/19: neg  HBT 03/03/19:  Increased hydrogen production to 12 ppm over baseline at 60 minutes with a secondary peak 17 ppm over baseline at 105 minutes. No significant change in methane production. Although this level of gas production does not reach the diagnostic cutoff for bacterialovergrowth of . 20 ppm over baseline at or before 1 hour, it suggests increased foregut bacterial colonization.  HBT 11/20/18: Diagnostic for small intestinal bacterial overgrowth in a patient colonized with methanogenic bacteria.  HBT 12/20/17: neg  HBT 10/08/17: Diagnostic for severe bacterial overgrowth with methanogenic bacteria.  HBT 06/20/17: Diagnostic for bacterial overgrowth in a patient colonized with methanogenic bacteria.    Labs  Heme labs:  Lab Results   Component Value Date    WBC 6.2 01/17/2018    HGB 15.9 01/17/2018    HCT 45.6 01/17/2018    MCV 94.9 01/17/2018    PLT 272 01/17/2018     No results found for: B12B  No results found for: IRON , TIBC, FERRITIN    Liver labs:  Lab Results   Component Value Date    ALT 41 01/17/2018    AST 29 01/17/2018    ALKPHOS 49 01/17/2018    BILITOT 0.5 01/17/2018     Lab Results   Component Value Date    HEPCAB Non-Reactive 01/17/2018     No results found for: INR, PROTIME    Misc labs:  No results found for: HBA1C, HGBA1C, PA1C  TISSUE TRANSGLUTAMINASE IGA ANTIBODY   Date Value Ref Range Status   02/11/2019 <1.2 <4.0 (Negative) U/mL Final     Comment:        Test Performed by:  [facility]  [address] [place], [place], [place] [address]  Lab Director: Elsie JUDITHANN Cumming M.D. Ph.D.; CLIA# [id number]     IGA   Date Value Ref  Range Status   02/11/2019 85 70 - 350 MG/DL Final          Electronically signed by: Emeline Norleen Speck, MD  07/29/19 [phone]

## 2019-09-08 NOTE — Telephone Encounter (Signed)
 Thanks. Sent patient message via [facility].  Will need yearly iFOB, updated HM tab as well.   Thank you  Jared      Electronically signed by: Emeline Norleen Speck, MD  09/08/19 [phone]

## 2019-11-05 IMAGING — MR MRI CERVICAL SPINE WITHOUT CONTRAST
6 of 7 series · 21 of 48 positions shown · IV contrast (gadolinium)
Comparison: Radiographs dated 08/03/2015.

﻿EXAM:  MRI CERVICAL SPINE WITHOUT CONTRAST
INDICATION: Neck pain with bilateral upper extremity numbness.
TECHNIQUE: Multiplanar multisequential MRI of the cervical spine was performed without gadolinium contrast.

[Series 5: T2 · sagittal · 3.0mm · 0.75mm/px · 4 of 15 slices shown (1 of 3)]
[im 1/15]
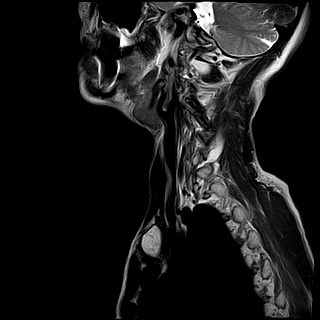
[im 5/15]
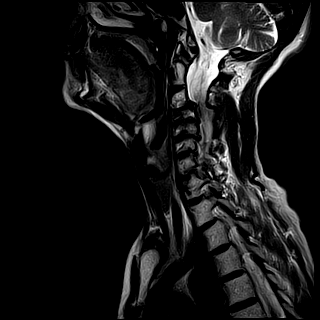
[im 10/15]
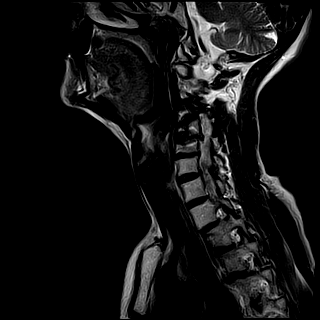
[im 15/15]
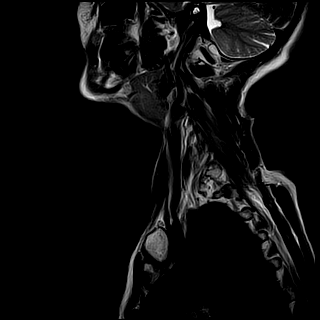

[Series 6: T1 · sagittal · 3.0mm · 0.47mm/px · 3 of 15 slices shown]
[im 1/15]
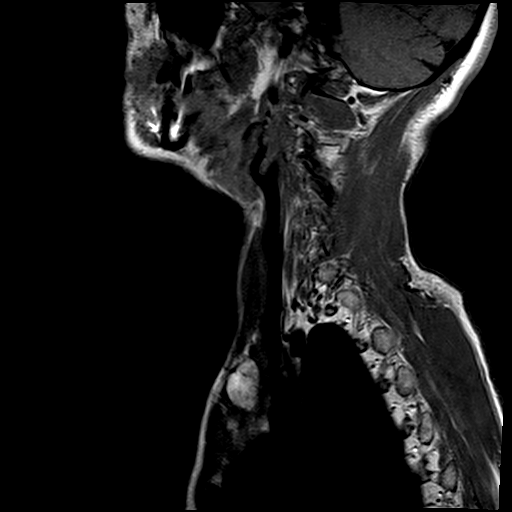
[im 8/15]
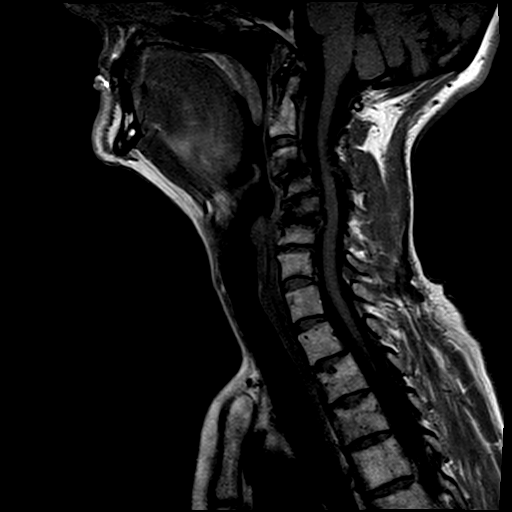
[im 15/15]
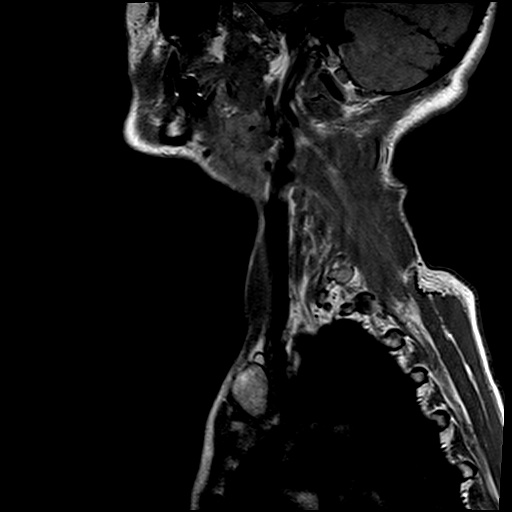

[Series 8: STIR · sagittal · 3.0mm · 0.47mm/px · 3 of 15 slices shown]
[im 1/15]
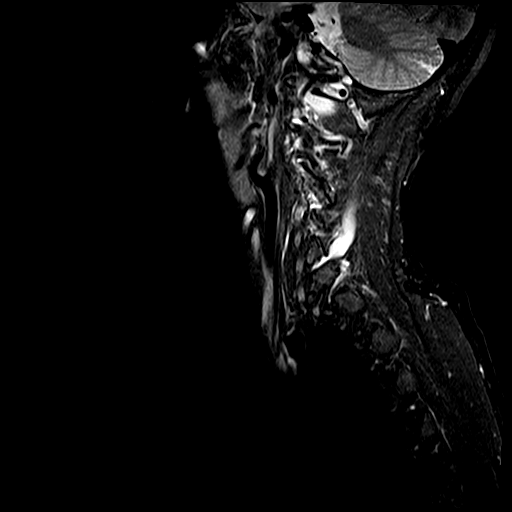
[im 8/15]
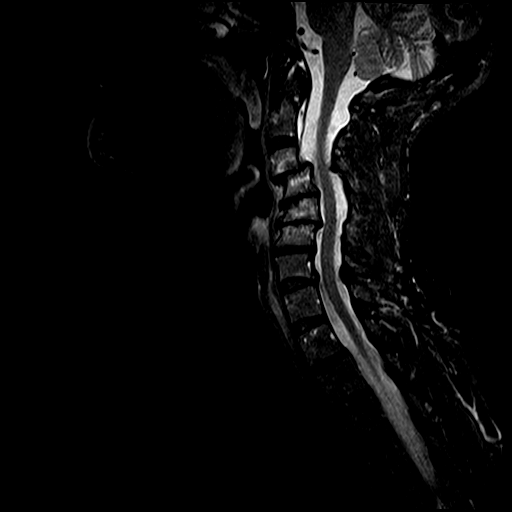
[im 15/15]
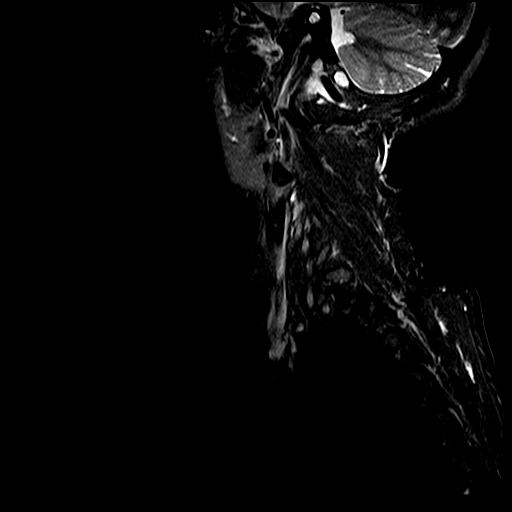

[Series 9: T2-star · axial · 3.0mm · 0.39mm/px · 1 of 18 slices shown]
[im 1/18]
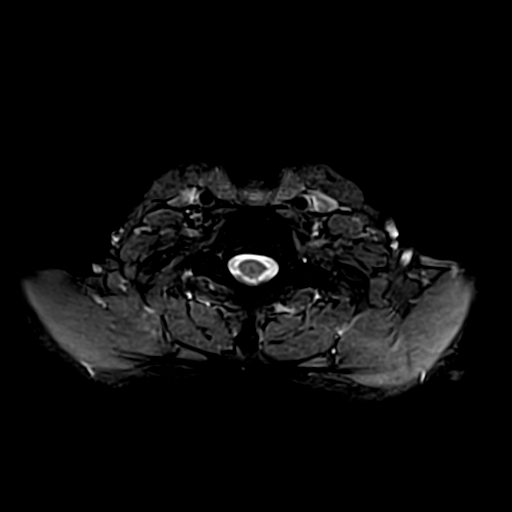

[Series 12: T2 · axial · 3.0mm · 0.39mm/px · z∈[-33,+48]mm · 4 of 18 slices shown (2 of 3)]
[im 1/18]
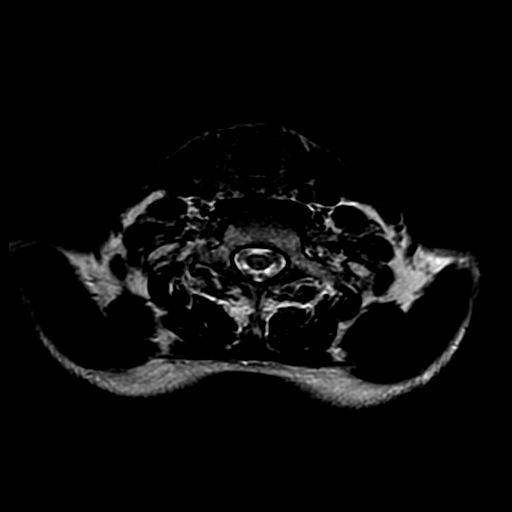
[im 6/18]
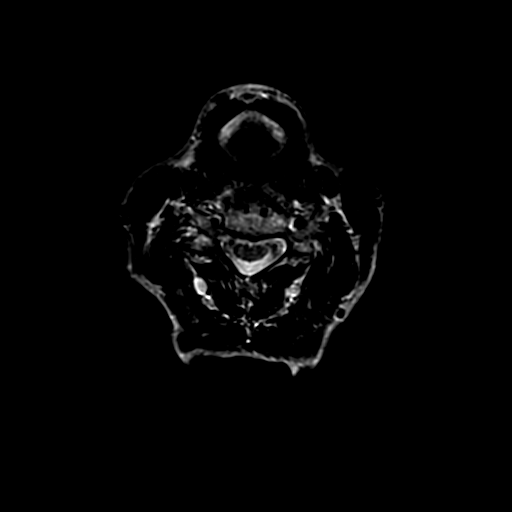
[im 12/18]
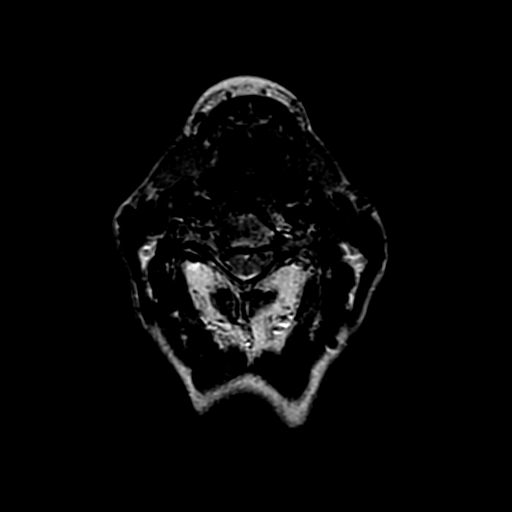
[im 18/18]
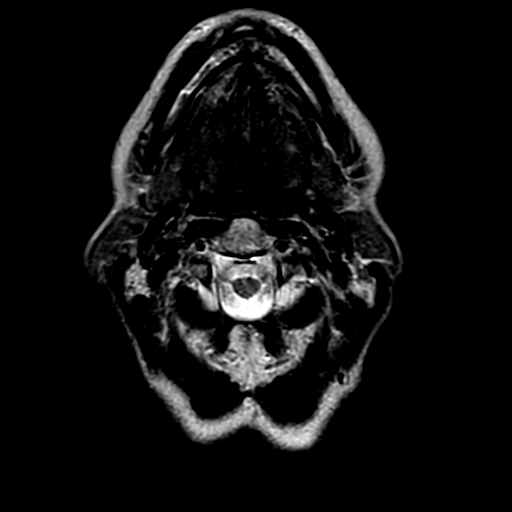

[Series 15: T2 · axial · 3.0mm · 0.35mm/px · z∈[-53,+46]mm · 6 of 26 slices shown (3 of 3)]
[im 1/26]
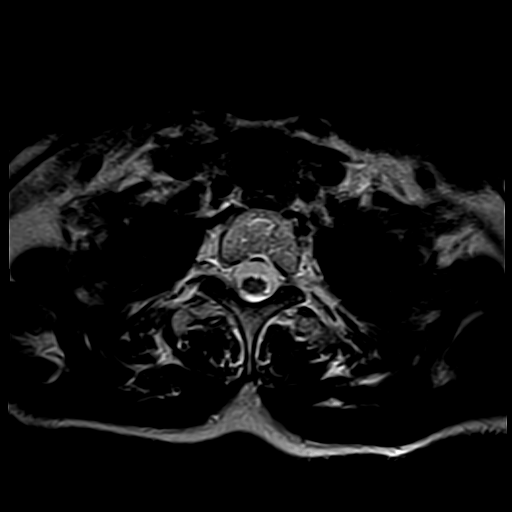
[im 6/26]
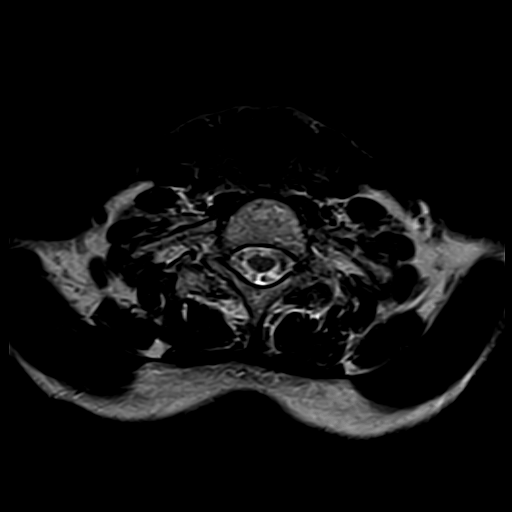
[im 11/26]
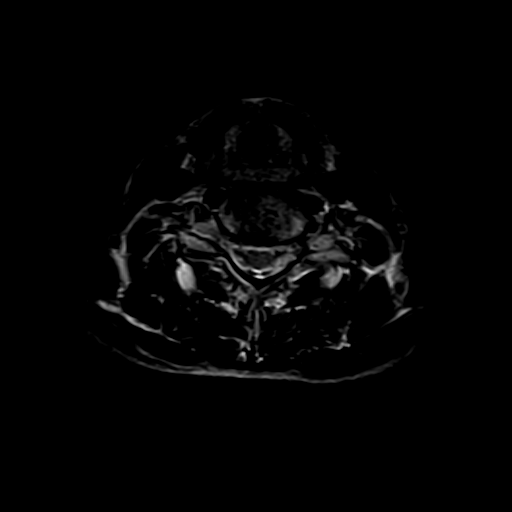
[im 16/26]
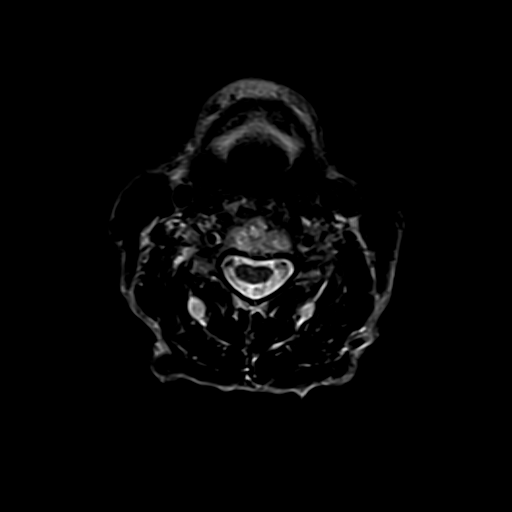
[im 21/26]
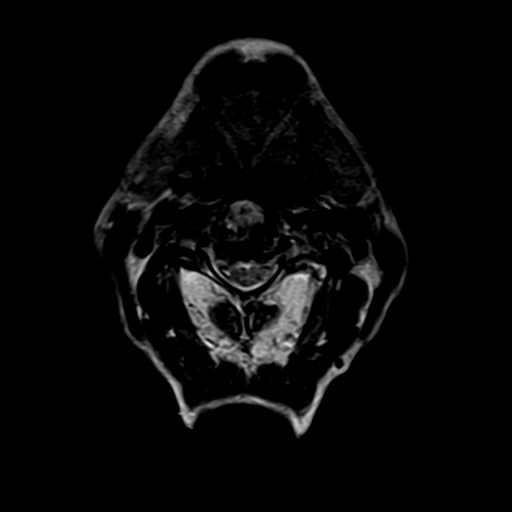
[im 26/26]
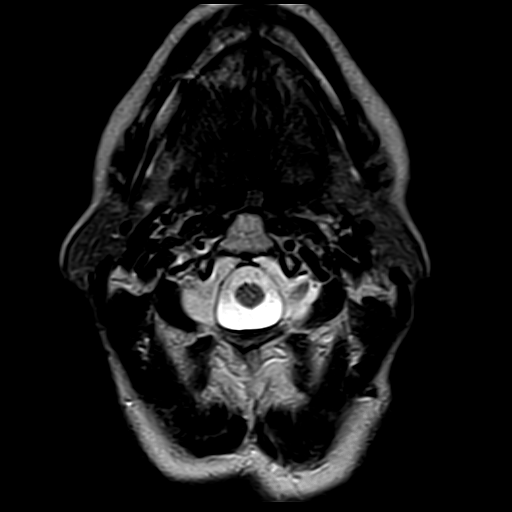

[21 of 48 positions shown; findings below may reference images not displayed]

FINDINGS: Reversal of cervical lordosis is similar to previous exam. No acute fracture or subluxation is seen. Visualized spinal cord is also normal in signal intensity without evidence of compression at any level.

C2-3 level is unremarkable.

At C3-4 level, marked disc desiccation is again seen. Grade 1 anterolisthesis of C3 on C4 vertebral body is also again identified. There is a minimal bulging annulus, minimally effacing the ventral CSF. There is moderate to severe left and mild-to-moderate right neural foraminal stenosis from facet and uncovertebral joint hypertrophy.

At C4-5 level, there is marked disc desiccation. There is a minimal bulging annulus, minimally effacing the ventral CSF. There is no significant neural foraminal stenosis.

At C5-6 level, there is marked disc desiccation. There is a small broad-based central disc osteophyte complex partially effacing the ventral CSF. There is mild-to-moderate bilateral neural foraminal stenosis from facet and uncovertebral joint hypertrophy.

At C6-7 level, there is a minimal bulging annulus, minimally effacing the ventral CSF. There is no significant neural foraminal stenosis.

At C7-T1 level, there is a small broad-based central disc bulge partially effacing the ventral CSF. There is mild left neural foraminal stenosis from uncovertebral joint hypertrophy.

There is also minimal anterolisthesis of T2 on T3 vertebral body without significant disc herniation. Paraspinal soft tissues are unremarkable.
IMPRESSION: 1. Stable reversal of cervical lordosis with grade 1 anterolisthesis of C3 on C4 vertebral body. 

2. Stable marked disc desiccation at multiple levels. 

3. No significant disc herniation or spinal stenosis at any level. 

4. Multilevel neural foraminal stenosis as detailed above.

## 2019-12-09 NOTE — Progress Notes (Signed)
 Gastroenterology Clinic Return Visit  HPI:  Sonya Parks is a 64 y.o. year old female who is seen in return for her history of constipation and IMO/SIBO. 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 Has continued to follow with [facility] for additional management of this chronic concern. Reports that overall, she has been feeling significantly better. Reports benefit from nutritional dietary modifications, supplementations and other adjunct therapies. Given recent HBT results, outside provider sent Rx for Rifaximin  that patient is planning on picking up and starting today despite feeling overall well currently off ABX for SIMO. Historical significant benefit with use of this Rx noted. Continues to be adherent to use of erythromycin  (1/4 tab nightly) used as motility agent prescribed via [facility]. States her bowel movements are overall significantly more regular. Endorses having one soft and formed stool daily with use of Milk of Mg and miralax daily. Denies any diarrhea, hematochezia, or melena. Reports significant benefit in bloating and flatulence. Also with overall control in previous reflux/indigestion. Rare episodes occurring only a few times monthly - responsive to PRN liquid Antacid. Not taking PPI or H2RA blocker regularly. Denies any dysphagia, odynophagia, nausea, vomiting or early satiety. Appetite good. Weight stable. Is off NSAIDs - not currently taking Rx celebrex  or mobic - some resulting joint pain 2/2 being off these medications.     PHYSICAL:  BP 123/71   Pulse 100   Temp 97.3 F (36.3 C) (Oral)   Ht 1.689 m (5' 6.5)   Wt 54.1 kg (119 lb 3.2 oz)   SpO2 100%   BMI 18.95 kg/m    General:  Sitting in chair in NAD  Eyes: EOMI, no scleral icterus  ENT: no oral lesions, OP clear  Cardiovascular:  Regular rate, no murmurs, no edema  Respiratory:  Clear to auscultation bilaterally, no wheezes or crackles, normal work of breathing  Abdomen:   Soft, nondistended, normoactive bowel sounds, non tender to palpation  Skin/MSK: no rash or jaundice, no CVA tenderness  Neuro:  alert and oriented x 3, no asterixis  Psych: pleasant affect, appropriate judgement    Assessment and Plan:  Sonya Parks is a 64 y.o. female who presents for follow-up on her historical bloating and constipation in the context of recurrent SIMO. HBT most recently + for SIMO in late oct - given Rx for rifaximin  per [facility] who are providing additional management support. Significant historical benefit noted from dietary and lifestyle modifications - to continue. Constipation controlled with use of miralax and milk of Mg. Reflux currently controlled off medication with dietary modifications and PRN liquid antacids. Will defer primary management to [facility] moving forward and continue to remain available on as needed - will follow Q57mo. Plan is discussed with patient who expresses understanding and agreement. Risks, benefits and alternatives to plan are discussed. All questions are answered.     Electronically signed by:  Saddie Earnie Cole, PA-C, 12/09/2019 11:34 AM  Attending Physician Statement  I saw and evaluated Sonya Parks with Saddie Cole, PA-C.  I personally reviewed key points of the history and exam with the patient.  I have discussed the patient's management with Sonya Parks, and I played a major role in the medical decision making.  I agree with history, exam, assessment and plan as written by Sonya Parks.    Briefly, Sonya Parks is a 64 y.o. 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  She is now seeing [facility] and undergoing an herbal therapy, dietary manipulation, and apparently prokinetic therapy in addition to rifaximin  planned.  We discussed I was unclear of the evidence base for these therapies and would be cautious about promotility agents; though she will continue to follow with [facility] for this.  It would be reasonable to repeat  rifaximin  prn for sypmtoms based on many HBT with +IMO and good response to this therapy.  Will need to update FIT on a yearly basis and she can do this with Sonya Parks if preferred. RTC 6 months.     Electronically Signed by: Emeline Norleen Speck, MD, Attending Physician  12/09/2019 12:05 PM     A total of 8/15/5 was spent with patient on the date of service by myself in addition to 20 minutes by [place] for a total of 48 minutes.     MOST RECENT STUDIES:   Colonoscopy 06/16/19: incomplete bowel prep continue current laxative regimen, add 17g Miralax to daily routine   EGD 06/16/19: Normal EGD.    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    Other:   HBT 11/09/19:positive for IMO  HBT 05/21/19: neg  HBT 03/03/19:  Increased hydrogen production to 12 ppm over baseline at 60 minutes with a secondary peak 17 ppm over baseline at 105 minutes. No significant change in methane production. Although this level of gas production does not reach the diagnostic cutoff for bacterialovergrowth of . 20 ppm over baseline at or before 1 hour, it suggests increased foregut bacterial colonization.  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  HBT 12/20/17: neg  HBT 10/08/17:Diagnostic for severe bacterial overgrowth with methanogenic bacteria.  HBT 06/20/17:Diagnostic for bacterial overgrowth in a patient colonized with methanogenic bacteria.    Labs  Heme labs:  Lab Results   Component Value Date    WBC 6.2 01/17/2018    HGB 15.9 01/17/2018    HCT 45.6 01/17/2018    MCV 94.9 01/17/2018    PLT 272 01/17/2018     No results found for: B12B  No results found for: IRON , TIBC, FERRITIN    Liver labs:  Lab Results   Component Value Date    ALT 41 01/17/2018    AST 29 01/17/2018    ALKPHOS 49 01/17/2018    BILITOT 0.5 01/17/2018     Lab Results   Component Value Date    HEPCAB Non-Reactive 01/17/2018     No  results found for: INR, PROTIME    Misc labs:  No results found for: HBA1C, HGBA1C, PA1C  TISSUE TRANSGLUTAMINASE IGA ANTIBODY   Date Value Ref Range Status   02/11/2019 <1.2 <4.0 (Negative) U/mL Final     Comment:        Test Performed by:  [facility]  [address] [place], [place], [place] [address]  Lab Director: Elsie JUDITHANN Cumming M.D. Ph.D.; CLIA# [id number]     IGA   Date Value Ref Range Status   02/11/2019 85 70 - 350 MG/DL Final          Electronically signed by: Emeline Norleen Speck, MD  12/09/19 1251

## 2020-05-20 NOTE — Telephone Encounter (Signed)
 I am not familiar with this testing modality.  I would favor hydrogen/methane tsting as done here  If her integrative heatlh providers want this, I would defer to them.      Electronically signed by: Emeline Norleen Speck, MD  05/20/20 1537

## 2020-06-22 NOTE — Progress Notes (Signed)
 Gastroenterology Clinic Return Visit  HPI:  Sonya Parks is a 65 y.o. year old female who is seen in return for her history of constipation and IMO/SIBO.    Patient continues to be followed by [facility] for management of SIBO and constipation. They have continued to perform GI evaluation - brings in a GI 36) panel today. 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 Has been started on oral pancreatic enzyme replacement - OTC not Rx. Continues to take extensive medical regimen including daily erythromycin , daily xifaxin, milk of mg, miralax, stool softeners, pancreatic enzymes, probiotics, naltrexone , protonix  and pepcid. Also using other intermittent OTC products. Overall she states she is doing better but not good. Reports in bowel regularity. States on average she will have one soft and forme stool daily. Denies any hematochezia or melena. Notes a few intermittent episodes of constipation - followed by blow-outs. Notes PRN use of extra OTC bowel regimens including milk of Mg, stool softeners, and miralax. Rare dulcolax - but notes cramping with use. Continued benefit in abdominal bloating and flatulence noted. Also notes improvement in abdominal discomfort on current regimen. States that despite improvements - planning to discontinue all medications to be able to get trio breath test via [facility] next week. When questioned on reasoning for this, states she wants to know [her] numbers.     In regards to her GERD, patient notes some refractory symptoms - worse postprandially or when lying supine. She endorses adherence to use of Protonix  20mg  QD and Pepcid 40mg  QHS - notes continued concern for daily heartburn despite use of antacids. States that she is having to take significant OTC tums. Intermittent esophogeal burning discomfort. Denies dysphagia or odynophagia. Avoiding NSAIDs. Appetite stable. Weight stable.     PHYSICAL:  BP 136/83   Pulse 106   Temp 97.7 F (36.5  C) (Temporal)   Ht 1.689 m (5' 6.5)   Wt 54 kg (119 lb)   SpO2 99%   BMI 18.92 kg/m    General:  Sitting in chair in NAD  Eyes: EOMI, no scleral icterus  ENT: no oral lesions, OP clear  Cardiovascular:  Regular rate, no murmurs, no edema  Respiratory:  Clear to auscultation bilaterally, no wheezes or crackles, normal work of breathing  Abdomen:  Soft, nondistended, normoactive bowel sounds, non tender to palpation  Skin/MSK: no rash or jaundice, no CVA tenderness  Neuro:  alert and oriented x 3, no asterixis  Psych: pleasant affect, appropriate judgement    MOST RECENT STUDIES:   Colonoscopy 06/16/19: incomplete bowel prep continue current laxative regimen, add 17g Miralax to daily routine   EGD 06/16/19: Normal EGD.    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    Other:   HBT 11/09/19:positive for IMO  HBT 05/21/19: neg  HBT 03/03/19:  Increased hydrogen production to 12 ppm over baseline at 60 minutes with a secondary peak 17 ppm over baseline at 105 minutes. No significant change in methane production. Although this level of gas production does not reach the diagnostic cutoff for bacterialovergrowth of . 20 ppm over baseline at or before 1 hour, it suggests increased foregut bacterial colonization.  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  HBT 12/20/17: neg  HBT 10/08/17:Diagnostic for severe bacterial overgrowth with methanogenic bacteria.  HBT 06/20/17:Diagnostic for bacterial overgrowth in a patient colonized with methanogenic bacteria.      Assessment and Plan:  Sonya Parks is a 65 y.o. 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 Her care is primarily managed by IM for concern of IMO/SIBO. She is seeing us  for opinion on their evaluation and care. She is undergoing an herbal therapy, dietary manipulation, and apparently prokinetic therapy in addition to rifaximin   planned. Discussed lack of evidence for these therapies and caution for some of the off label uses. She continues to plan to obtain primary care by [facility]. Will defer medication management to them. Will continue to update FIT annually. GERD currently refractory to Protonix  20mg  QD and pepcid QHS. Will recommend an increase in PPI to 40mg  QD. As we are not primary providers - discussed no further change in plan by us  at this time.     I have personally spent 37 minutes involved in face-to-face and non-face-to-face activities for this patient on the day of the visit.  Professional time spent includes the following activities, in addition to those noted in the documentation: EMR review, patient education, and documentation.   Electronically signed by:  Sonya Earnie Cole, PA-C, 06/22/2020 12:02 PM    Attending Physician Statement  I saw and evaluated Sonya Parks with Sonya Cole, PA-C.  I personally reviewed key points of the history and exam with the patient.  I have discussed the patient's management with Sonya Parks, and I played the major role in the medical decision making.  I agree with history, exam, assessment and plan as written by Sonya Parks. I performed more than 50% of the time of the shared visit - my time on the DOS was 6/34/10 - 50 minutes for a total encounter time of 87 minutes on the DOS.  Briefly, Sonya Parks is a 65 y.o. female with chronic abdominal symptoms of pain, bloating, distention, pressure, reflux, and constipation.  She was previously seen by Dr. Dena and diagnosed with IMO, but never had a dramatic response to antibiotics.  We had previously suggested that her symptoms are likely more related to IBS-C with a component of outlet dysfunction, though trials of Linzess, Trulance, and Amitiza have ultimately been ineffective.  Anorectal manometry was recommended but not pursued.  Colonoscopy was incomplete secondary inadequate prep and a fit test was negative in 2021.  More recently, she  has seen [facility] who have been pursuing allergy testing, microbiome testing, pancreatic insufficiency testing, and other diagnostic tests with several therapeutic trials.  Ultimately, she is very restrictive in her dietary pattern to include no wheat, no processed foods, no meats, no starches, and no sugars.  She is currently on multiple medications as prescribed by [facility], but ultimately she continues to struggle with significant discomfort.  She also notes fatigue, brain fog, weird things in my body, red spots on her skin, a white spot on her tongue, mild erythema, and right leg pain.  Her main GI symptoms at this time are flatulence, bloating, reflux, and tightness and discomfort in her abdomen with intolerance of p.o. intake.  We had a very extensive conversation regarding the role of SIBO/IMO, leaky gut, the microbiome, and my approach to her care.  I discussed with her that it would likely be very difficult to pursue both treatment through [facility] and through our office as the differing diagnostics of unclear relationship as well as numerous therapeutic trials would complicate management.  She agrees and would like to pursue information on the testing she has received from [facility].  We discussed that excluding  pancreatic insufficiency could be considered and we will check fecal fat and fecal elastase today.  However we discussed that with her constipation, this would be an atypical presentation of EPI.  I also recommend that we would consider starting from scratch including liberalizing her diet, and working to normalize her bowel habits with the addition of anorectal manometry and/or pelvic floor therapy.  We also discussed the important role of central neuromodulators and consideration for cognitive behavioral therapy given there may be a role of anxiety in her symptoms.  She agreed with this interplay and would be  interested in this in the future as needed.  We will be available to the patient on an as-needed basis pending her discussion with [facility] as per her preference.  She should continue yearly fecal occult blood testing through her PCP.       Electronically signed by: Emeline Norleen Speck, MD  06/22/20 1350

## 2020-06-29 IMAGING — CR XRAY HIP W/PELVIS UNIL 2-3 VIEWS
1 series · 2 of 2 positions shown · non-contrast
Comparison: None available.

﻿EXAM:  04404      XRAY HIP W/PELVIS UNIL 2-3 VIEWS
INDICATION: Pain.

[Series 1: view not recorded · 0.17mm/px · 2 of 2 slices shown]
[im 1/2]
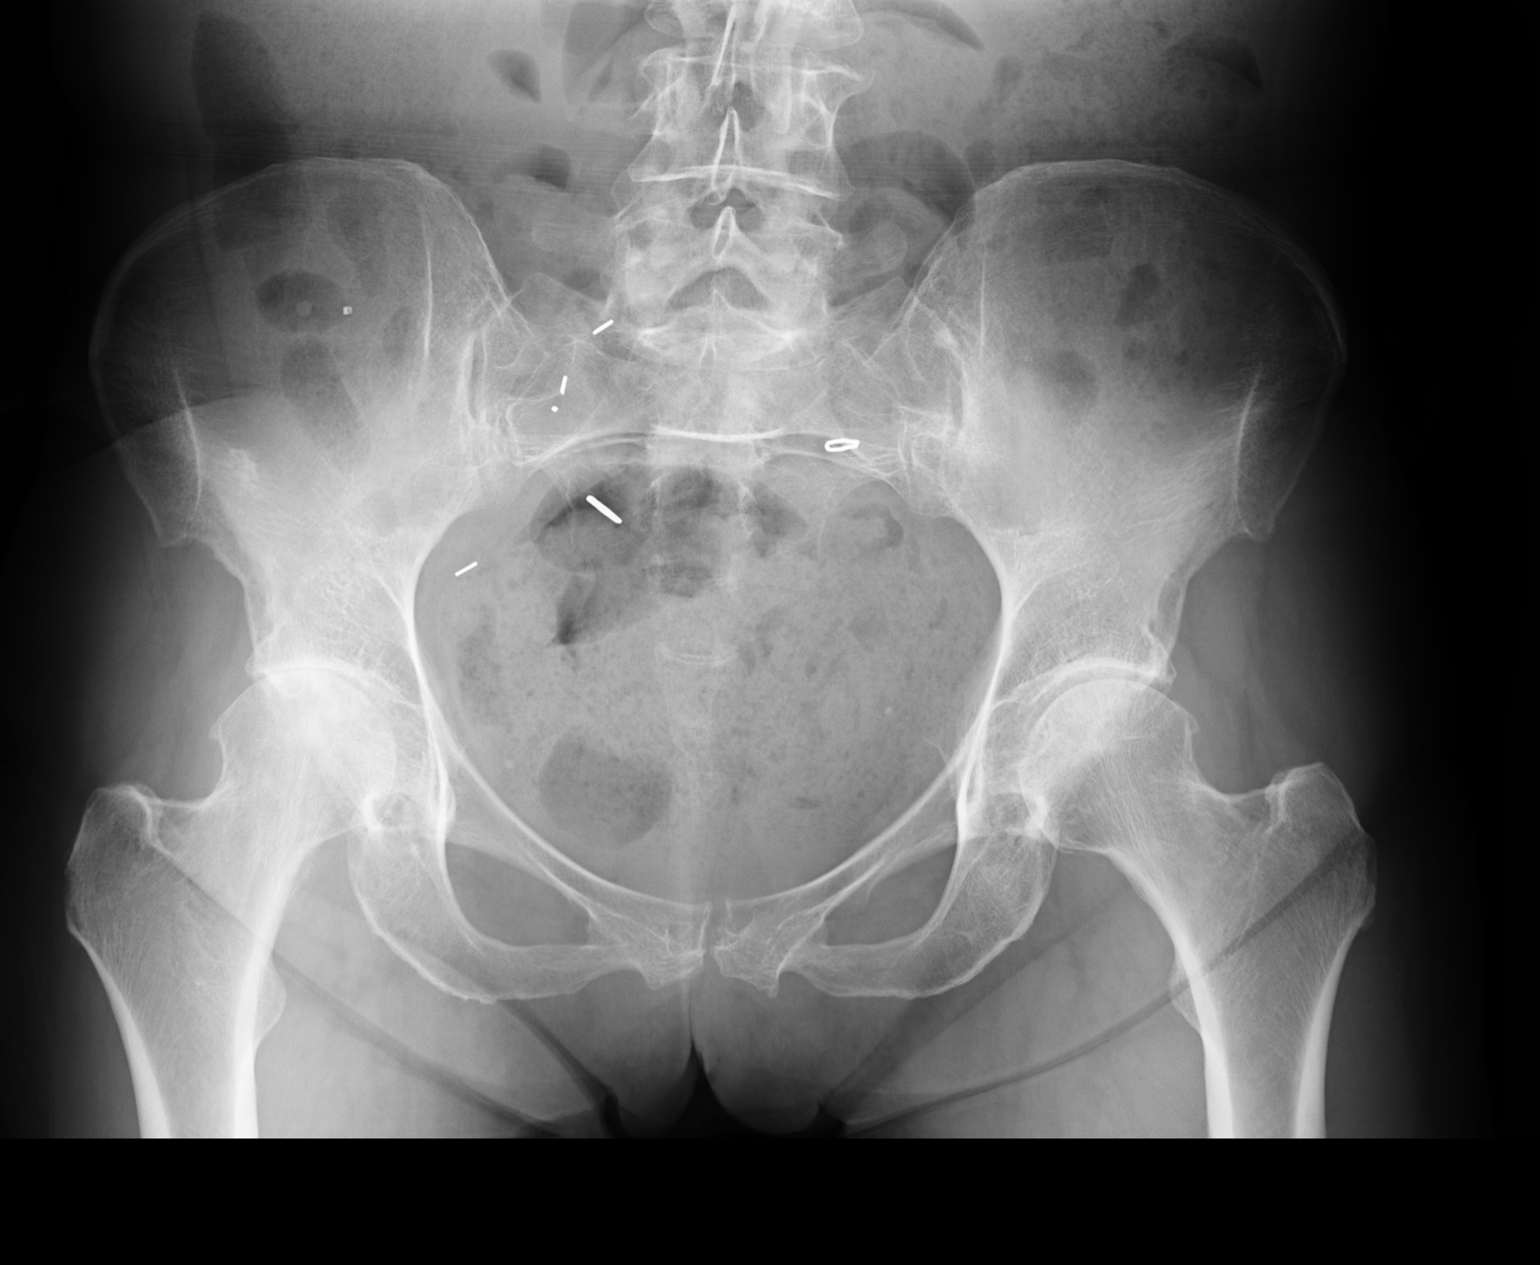
[im 2/2]
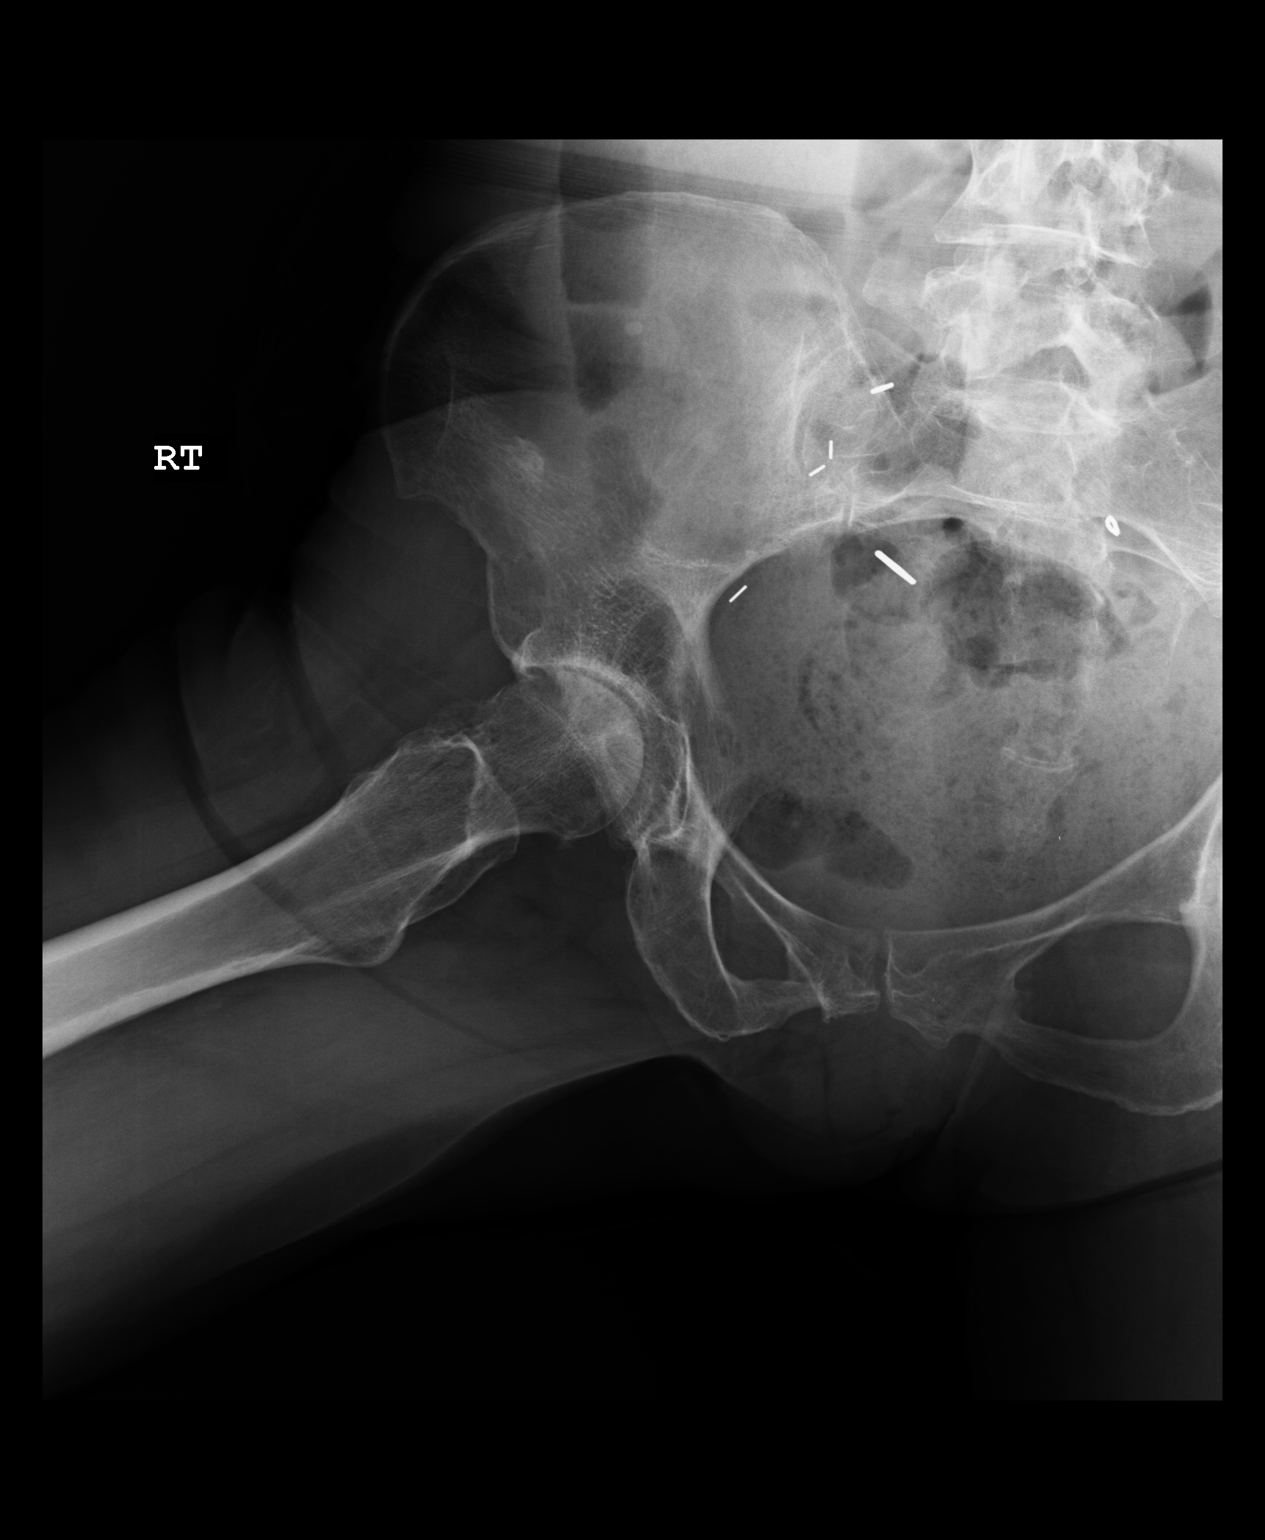

[2 of 2 positions shown; findings below may reference images not displayed]

FINDINGS: There is no acute fracture or subluxation. Moderate to severe right hip superior joint space narrowing is noted. Surrounding soft tissues are unremarkable.
IMPRESSION: Osteoarthritis, no acute osseous abnormality.

## 2020-07-05 NOTE — Telephone Encounter (Signed)
 Thanks. Will order MRI because of CT contrast shortage.  My apologies.      Electronically signed by: Emeline Norleen Speck, MD  07/05/20 1357

## 2020-08-02 IMAGING — US US RETROPERITONEAL COMPLETE
1 series · 14 of 25 positions shown · non-contrast
Comparison: None available.

﻿EXAM:  US RETROPERITONEAL COMPLETE,US BLADDER
INDICATION: Abnormal renal function tests.

[Series 1: us retroperitoneal complete · 14 of 56 slices shown]
[im 1/56]
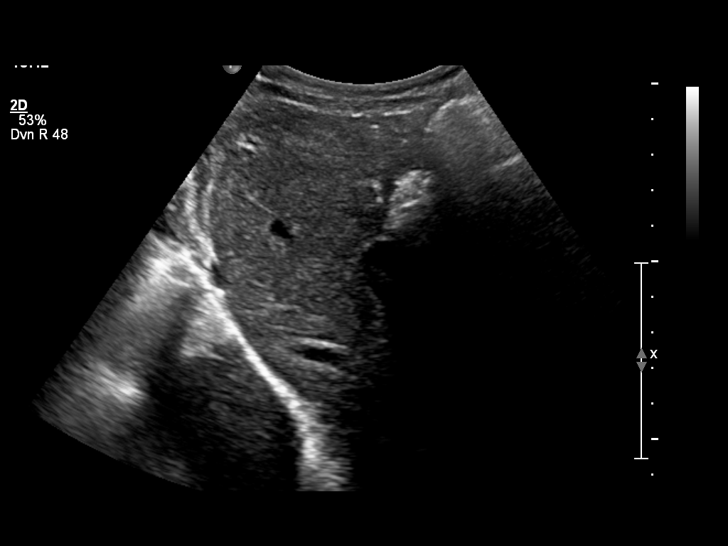
[im 5/56]
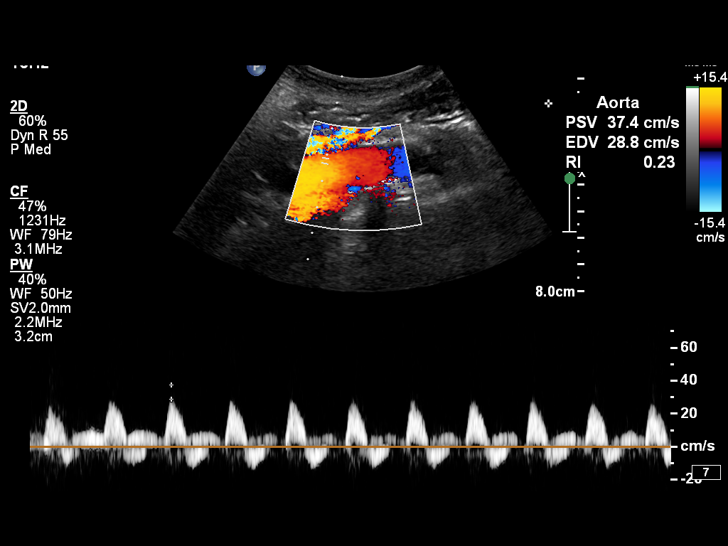
[im 10/56]
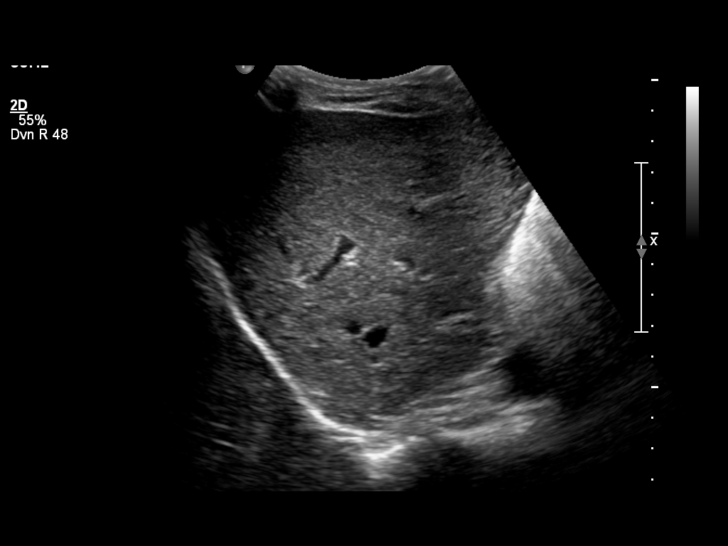
[im 14/56]
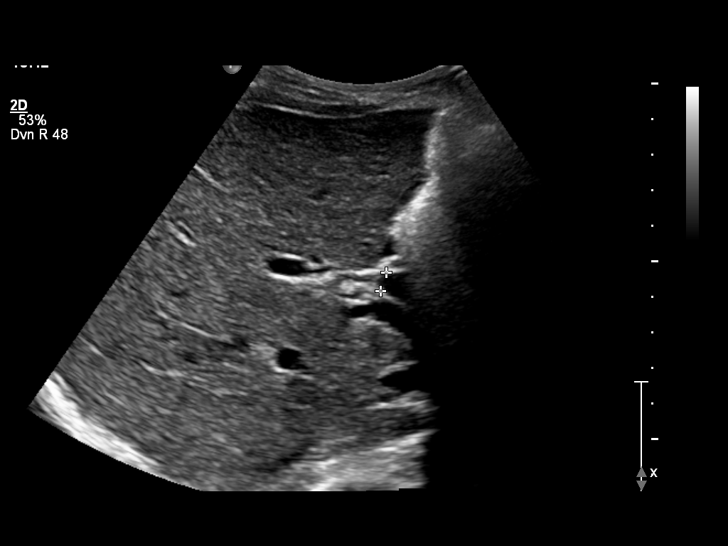
[im 19/56]
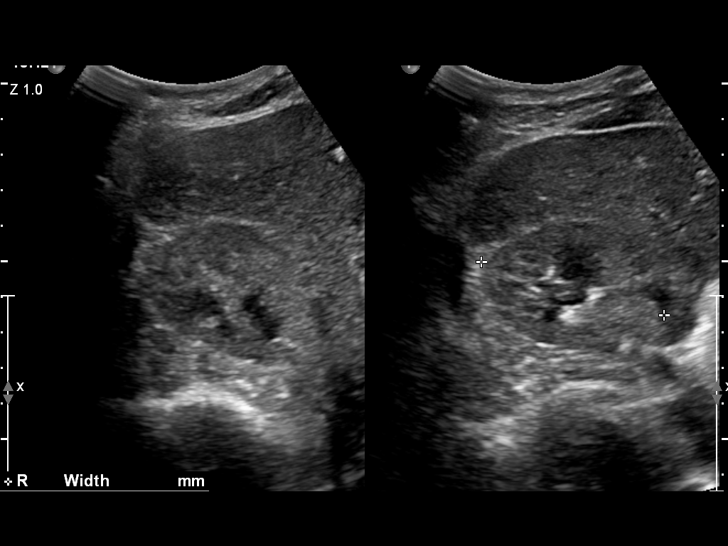
[im 21/56]
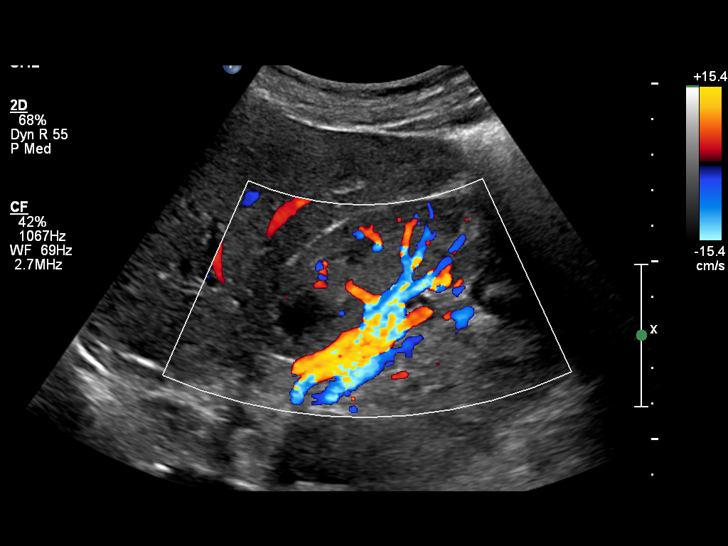
[im 26/56]
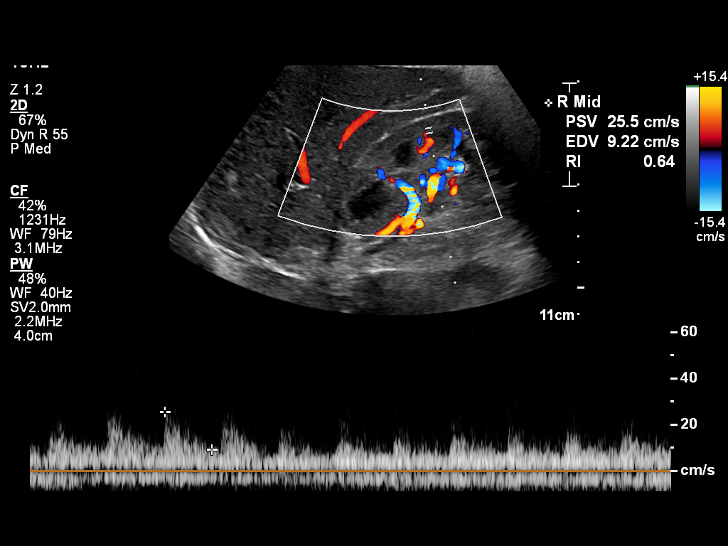
[im 30/56]
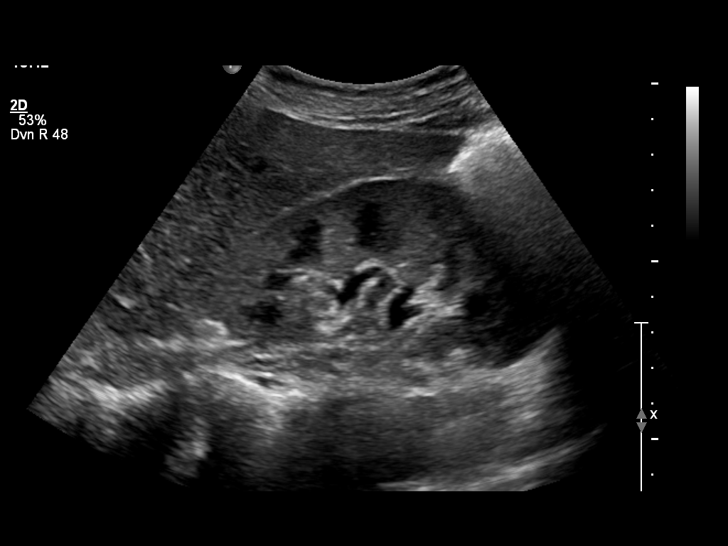
[im 35/56]
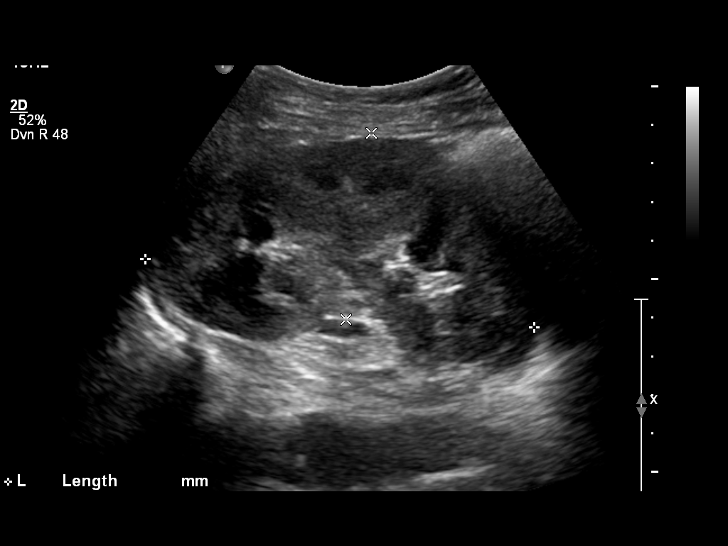
[im 37/56]
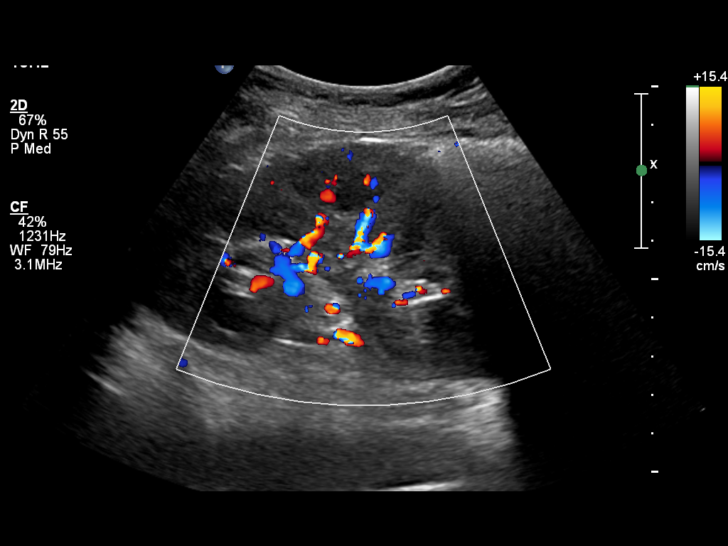
[im 42/56]
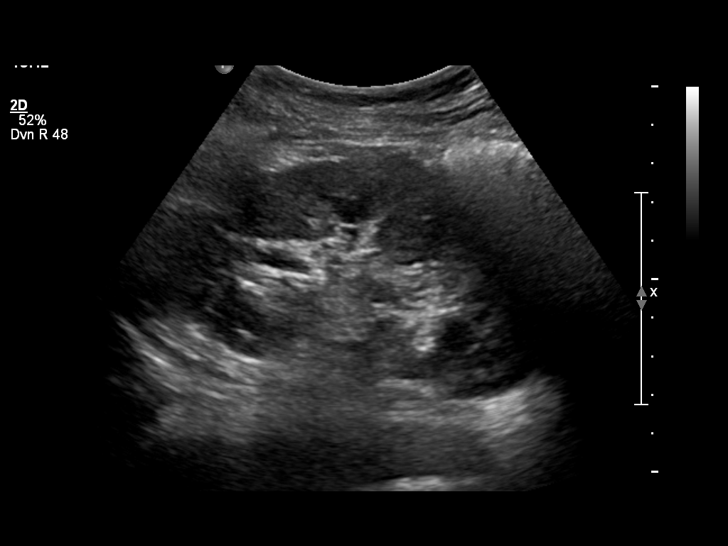
[im 46/56]
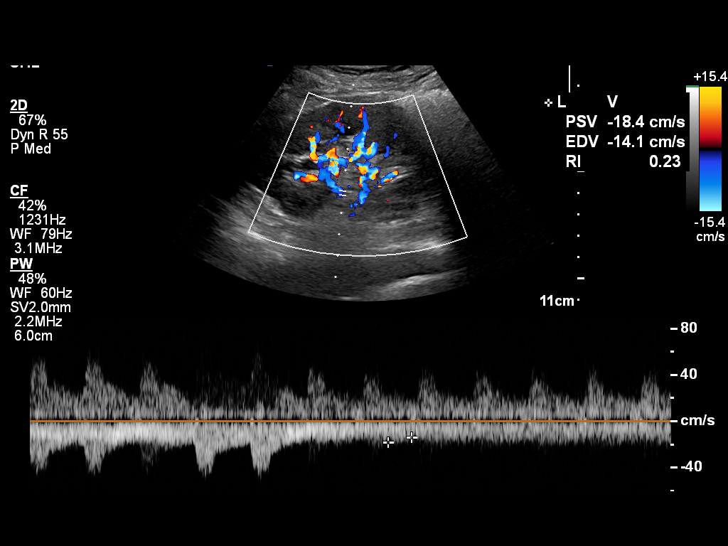
[im 51/56]
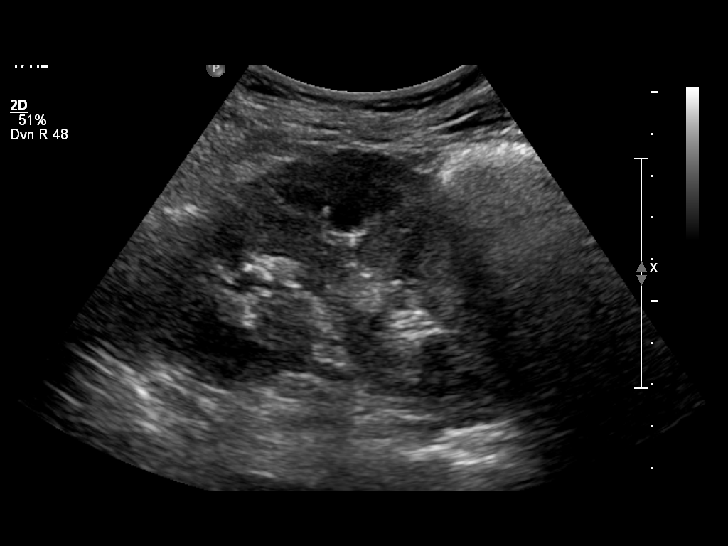
[im 56/56]
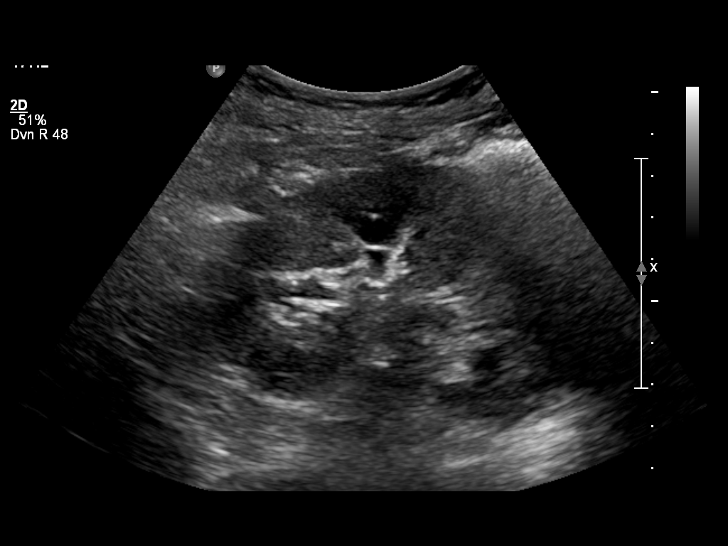

[14 of 25 positions shown; findings below may reference images not displayed]

FINDINGS: Kidneys are normal in echogenicity. Right kidney measures 8.5 cm and left kidney measures 10 cm. There is no hydronephrosis, mass, cyst or shadowing calculus on either side.

Urinary bladder is partially distended and grossly unremarkable. There is no postvoid residual.

Significant atherosclerotic changes seen within the abdominal aorta.
IMPRESSION: Unremarkable renal sonogram.

## 2020-08-02 IMAGING — US US BLADDER
1 series · 14 of 15 positions shown · non-contrast
Comparison: None available.

﻿EXAM:  US RETROPERITONEAL COMPLETE,US BLADDER
INDICATION: Abnormal renal function tests.

[Series 1: us bladder · 14 of 15 slices shown]
[im 1/15]
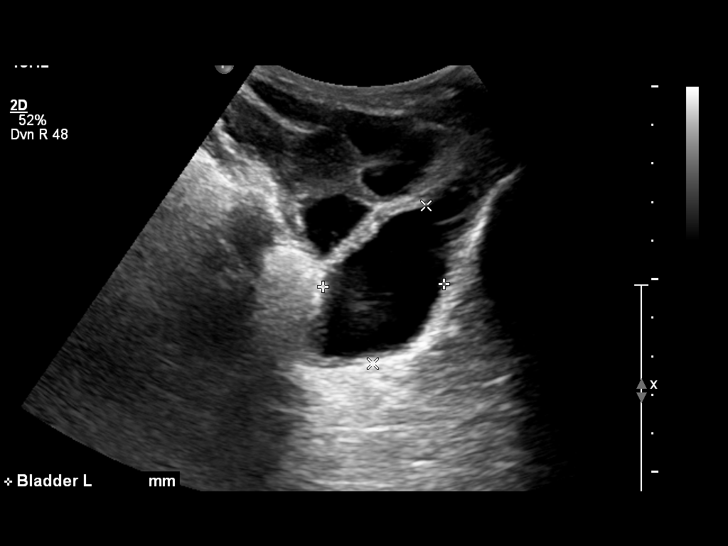
[im 2/15]
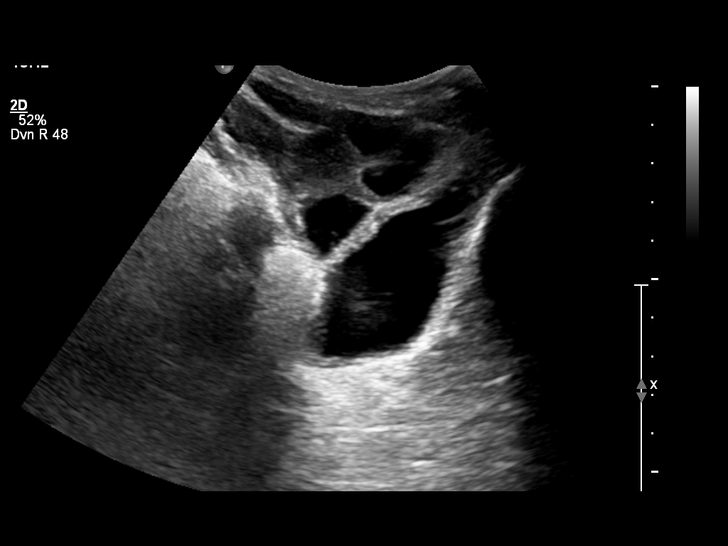
[im 3/15]
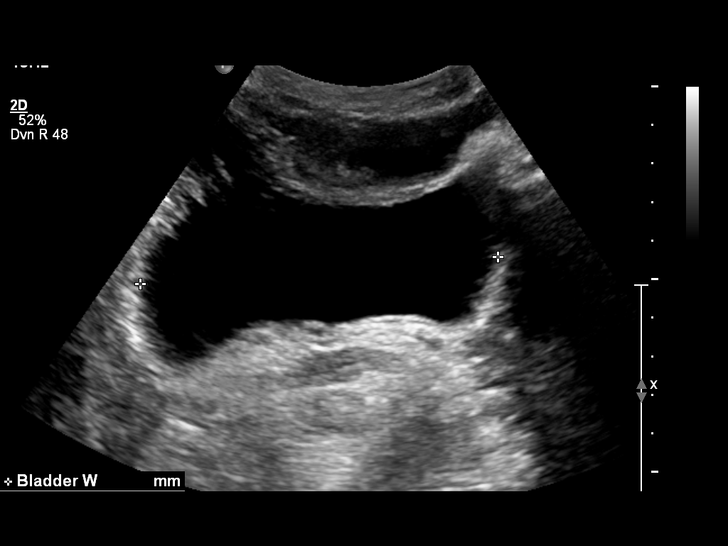
[im 4/15]
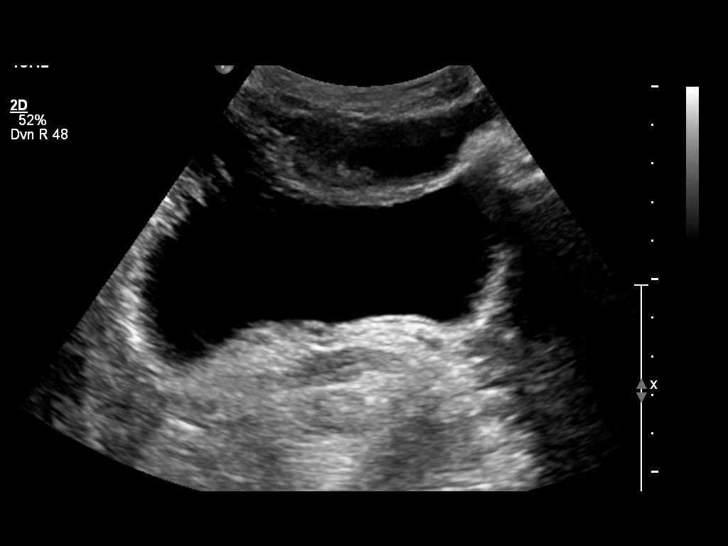
[im 5/15]
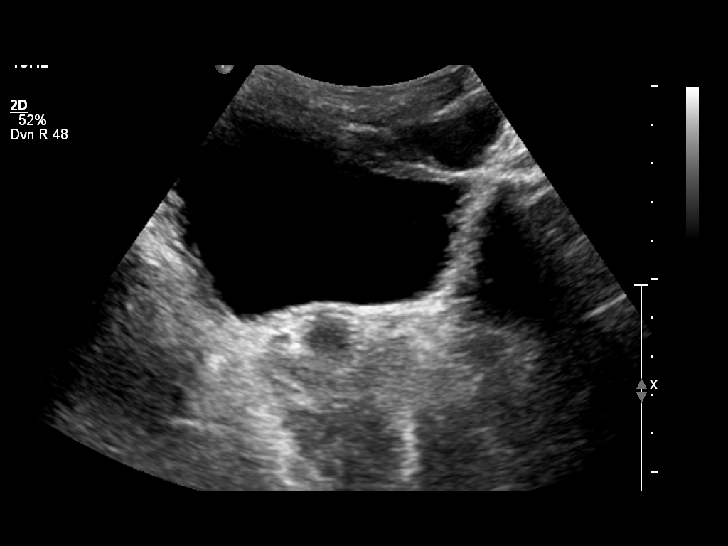
[im 6/15]
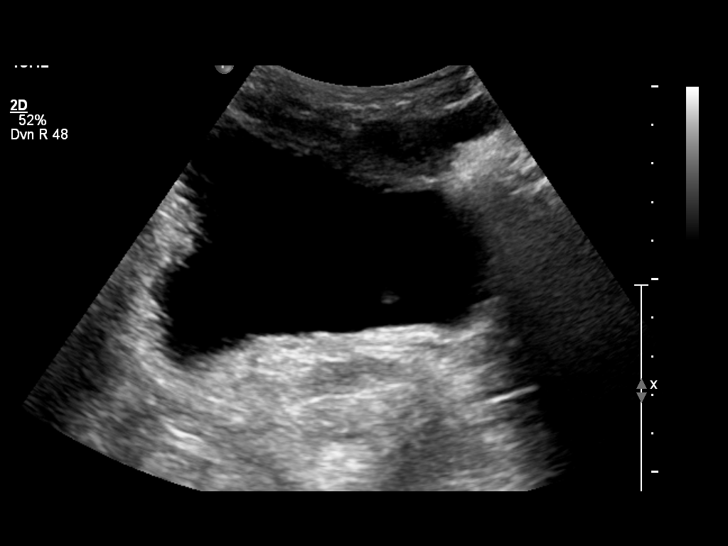
[im 7/15]
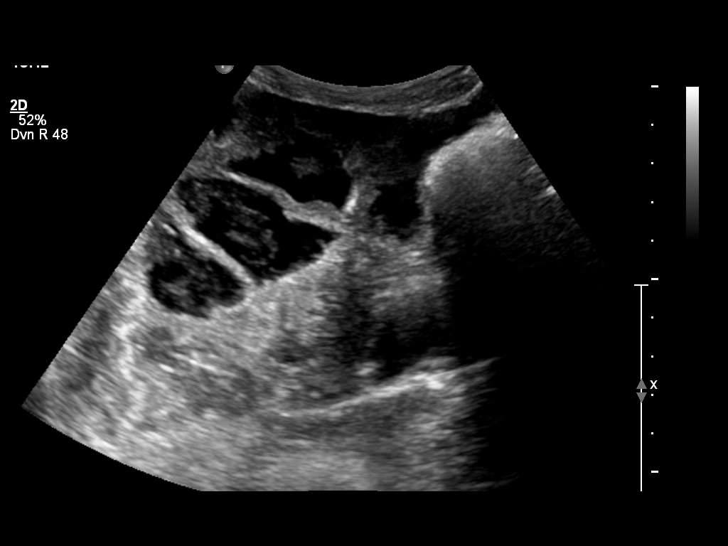
[im 9/15]
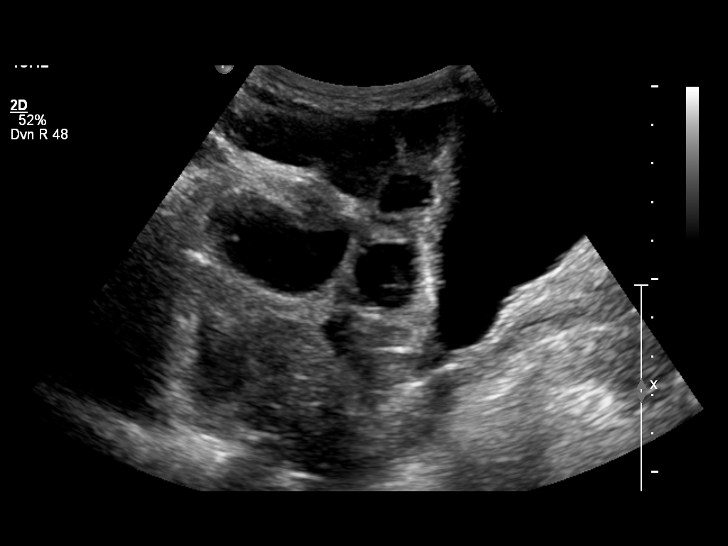
[im 10/15]
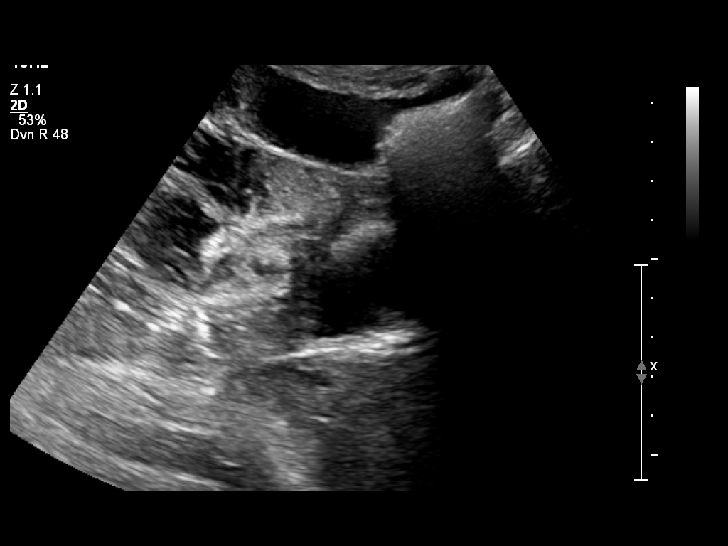
[im 11/15]
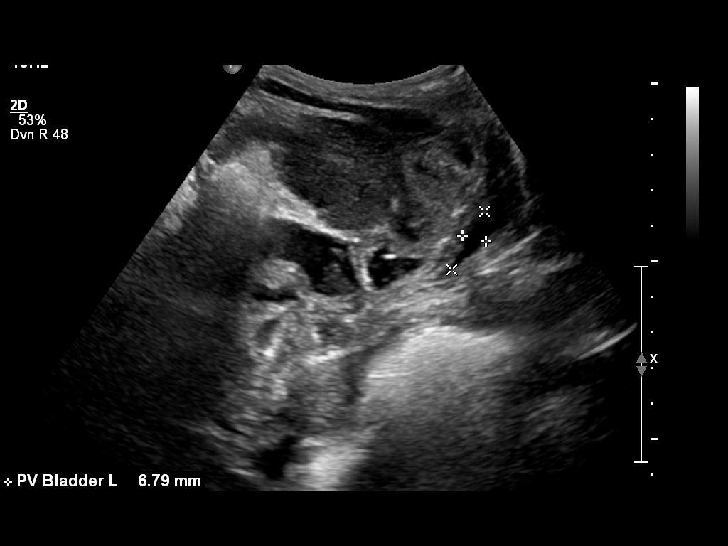
[im 12/15]
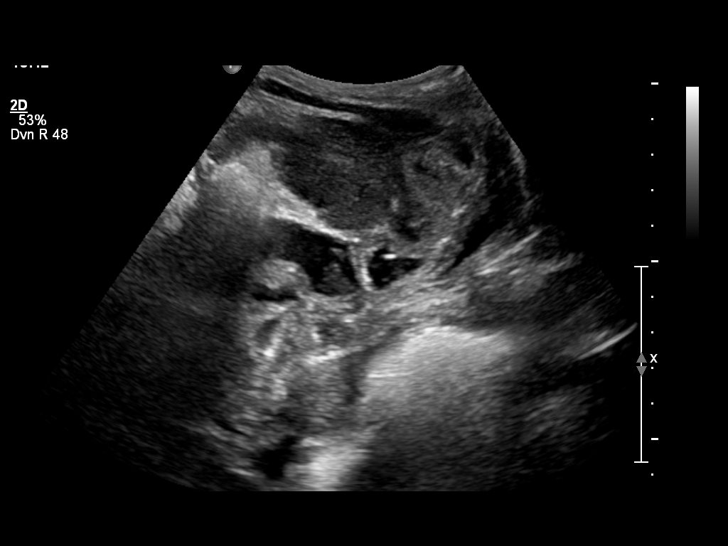
[im 13/15]
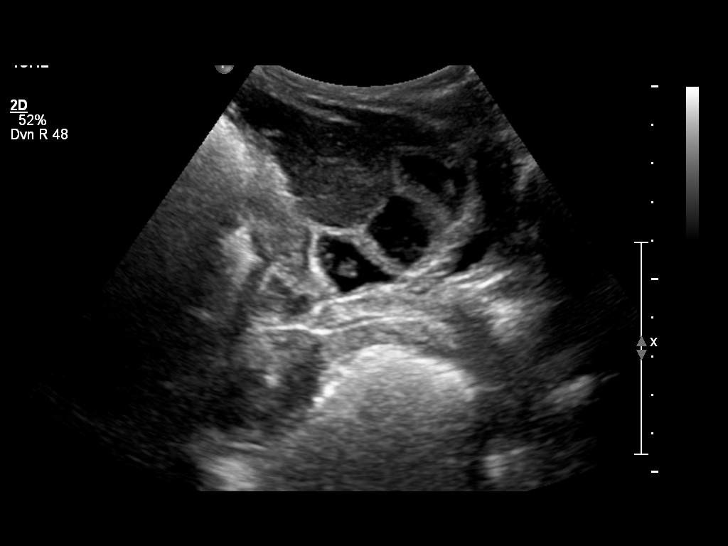
[im 14/15]
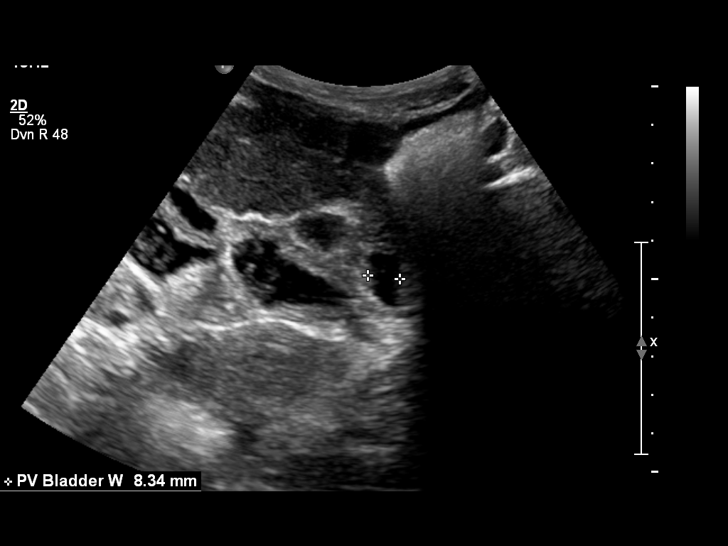
[im 15/15]
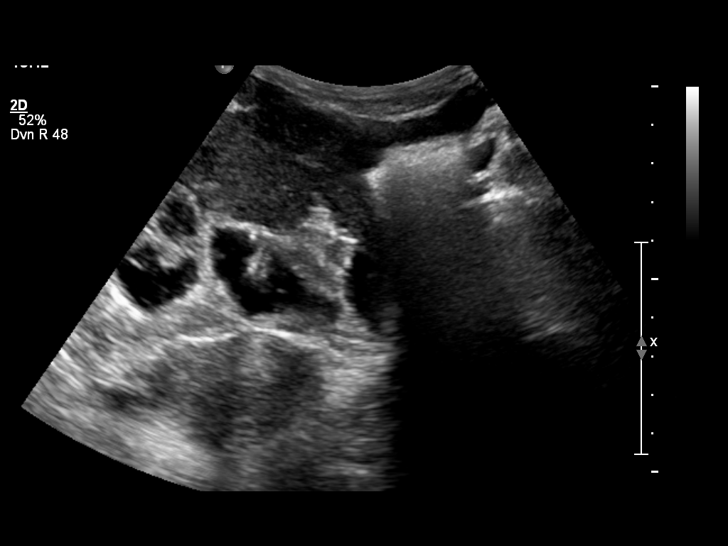

[14 of 15 positions shown; findings below may reference images not displayed]

FINDINGS: Kidneys are normal in echogenicity. Right kidney measures 8.5 cm and left kidney measures 10 cm. There is no hydronephrosis, mass, cyst or shadowing calculus on either side.

Urinary bladder is partially distended and grossly unremarkable. There is no postvoid residual.

Significant atherosclerotic changes seen within the abdominal aorta.
IMPRESSION: Unremarkable renal sonogram.

## 2020-08-03 NOTE — Telephone Encounter (Signed)
 Ok thanks! Just let me know how I can help     Electronically signed by: Emeline Norleen Speck, MD  08/03/20 1243

## 2020-08-16 IMAGING — MR MRI ABDOMEN WITHOUT AND WITH CONTRAST
11 of 13 series · 34 of 48 positions shown · IV contrast (gadavist)
Comparison: None available.

﻿EXAM:  [id number]   MRI ABDOMEN WITHOUT AND WITH CONTRAST
INDICATION: Steatorrhea and weight loss.
TECHNIQUE: Multiplanar multisequential MRI of the abdomen was performed without and with 5 mL of Gadavist.

[Series 7: cor basg bh · coronal · 9.0mm · 0.78mm/px · 2 of 24 slices shown]
[im 1/24]
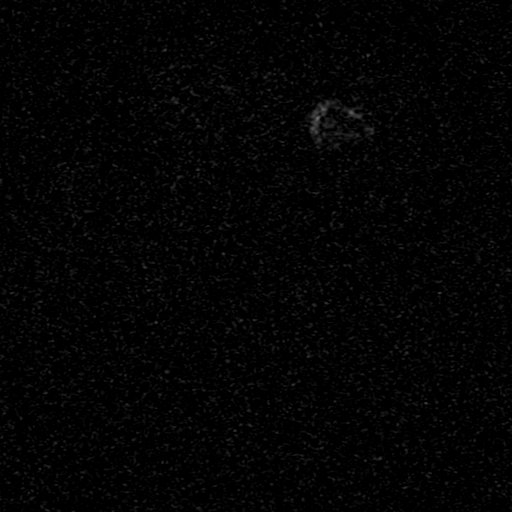
[im 24/24]
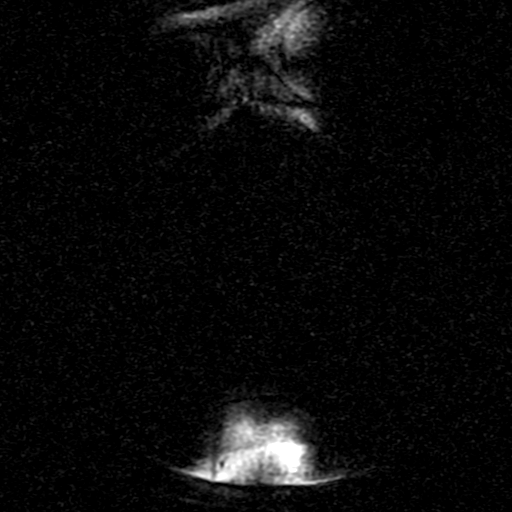

[Series 8: T1 · coronal · 9.0mm · 1.56mm/px · 3 of 24 slices shown (1 of 2)]
[im 1/24]
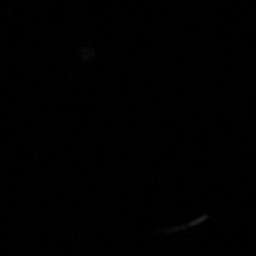
[im 12/24]
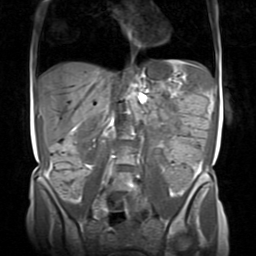
[im 24/24]
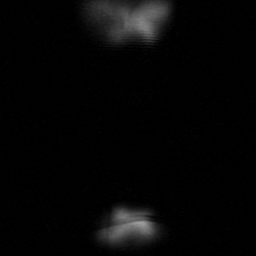

[Series 9: T2 · coronal · 9.0mm · 1.56mm/px · 3 of 24 slices shown (1 of 2)]
[im 1/24]
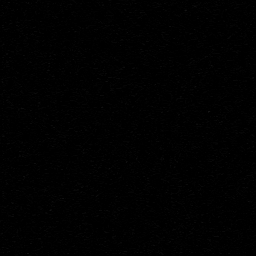
[im 12/24]
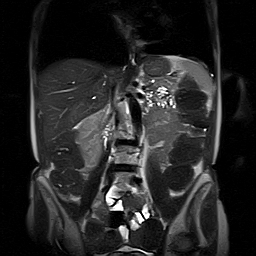
[im 24/24]
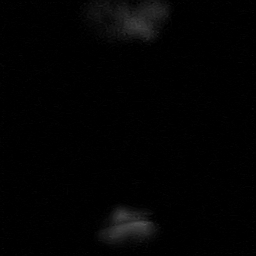

[Series 10: axial in/out phase · axial · 9.0mm · 1.56mm/px · z∈[-126,+124]mm · 3 of 26 slices shown (1 of 2)]
[im 1/26]
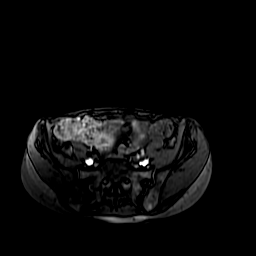
[im 13/26]
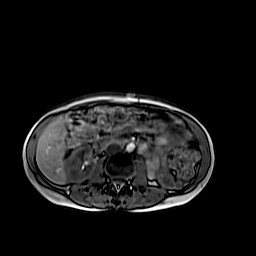
[im 26/26]
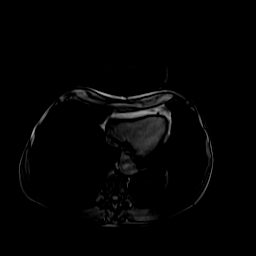

[Series 11: axial in/out phase · axial · 9.0mm · 1.56mm/px · z∈[-126,+124]mm · 3 of 26 slices shown (2 of 2)]
[im 1/26]
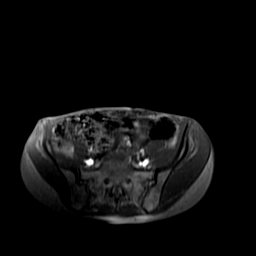
[im 13/26]
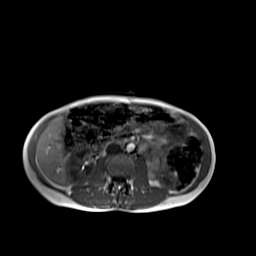
[im 26/26]
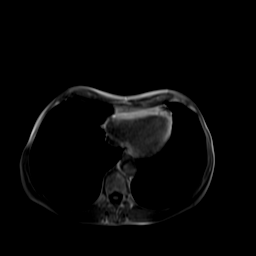

[Series 13: T2 fat-sat · axial · 9.0mm · 1.56mm/px · z∈[-126,+124]mm · 3 of 26 slices shown]
[im 1/26]
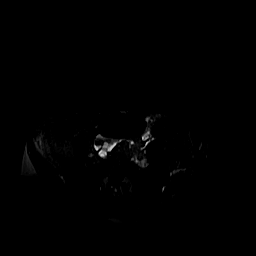
[im 13/26]
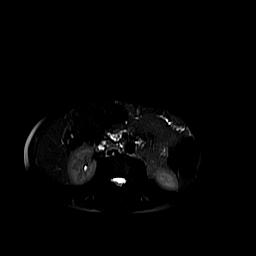
[im 26/26]
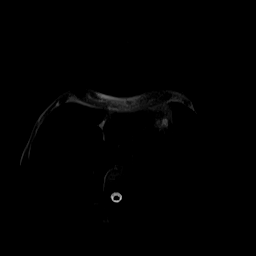

[Series 14: T2 · axial · 9.0mm · 1.56mm/px · z∈[-126,+124]mm · 3 of 26 slices shown (2 of 2)]
[im 1/26]
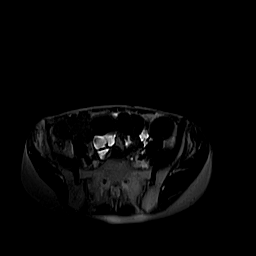
[im 13/26]
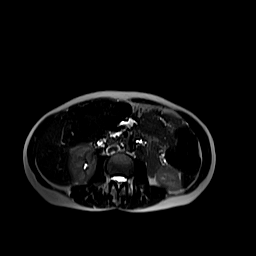
[im 26/26]
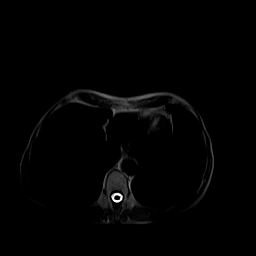

[Series 15: T1 · axial · 9.0mm · 1.56mm/px · z∈[-126,+124]mm · 3 of 26 slices shown (2 of 2)]
[im 1/26]
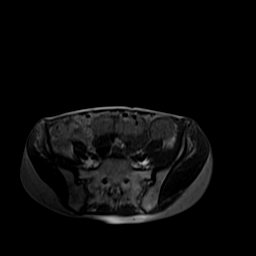
[im 13/26]
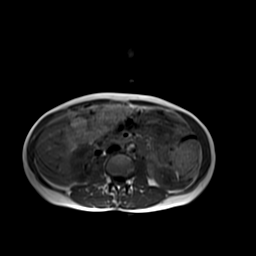
[im 26/26]
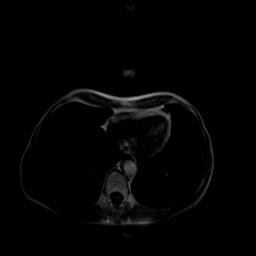

[Series 16: axial basg bh · axial · 9.0mm · 0.78mm/px · z∈[-126,+124]mm · 3 of 26 slices shown]
[im 1/26]
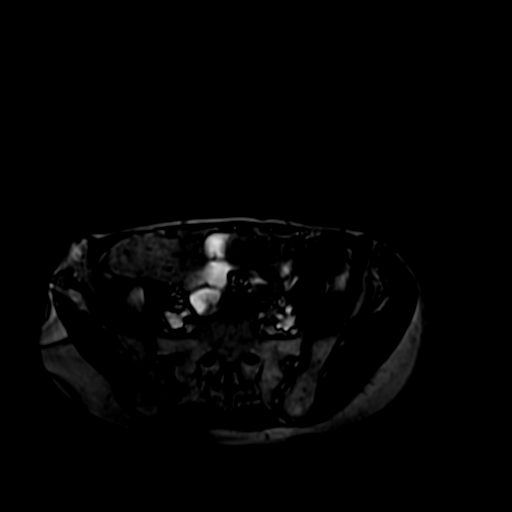
[im 13/26]
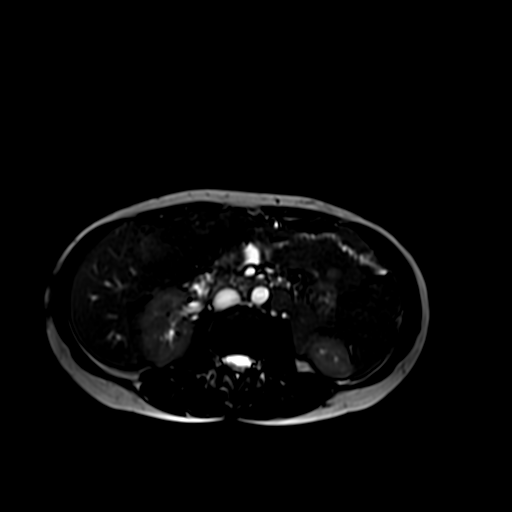
[im 26/26]
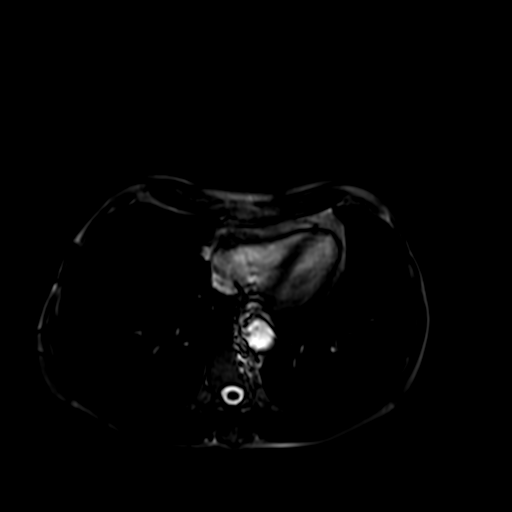

[Series 17: axial (person_name) pre · axial · non-contrast · 9.5mm · 0.78mm/px · z∈[-120,+122]mm · 6 of 52 slices shown]
[im 1/52]
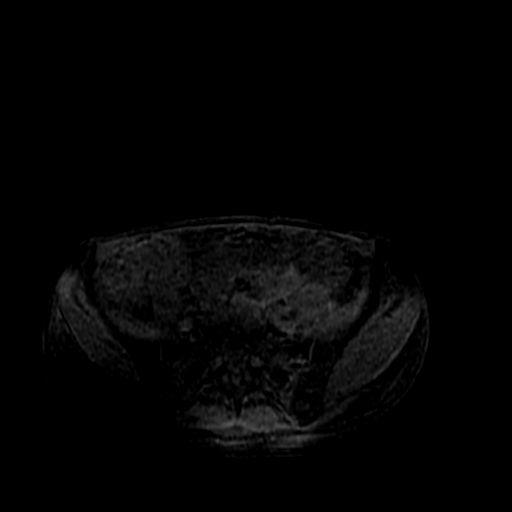
[im 11/52]
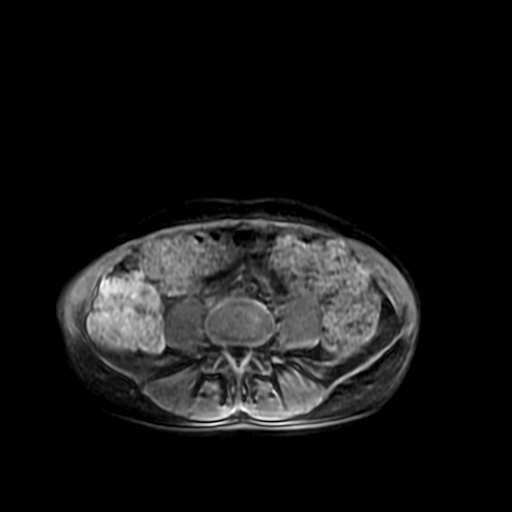
[im 21/52]
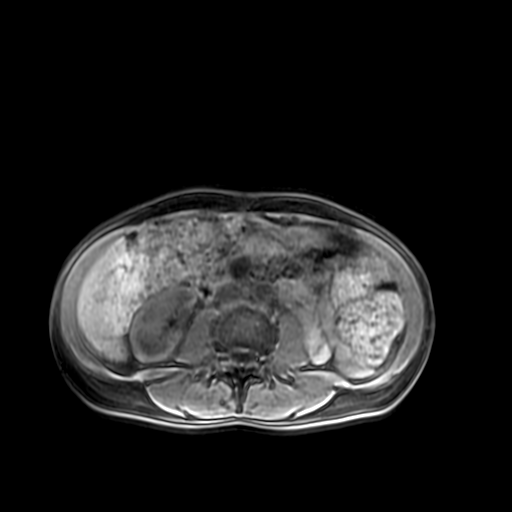
[im 31/52]
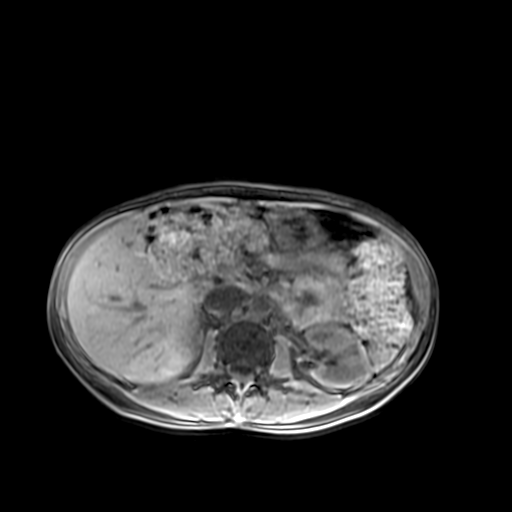
[im 41/52]
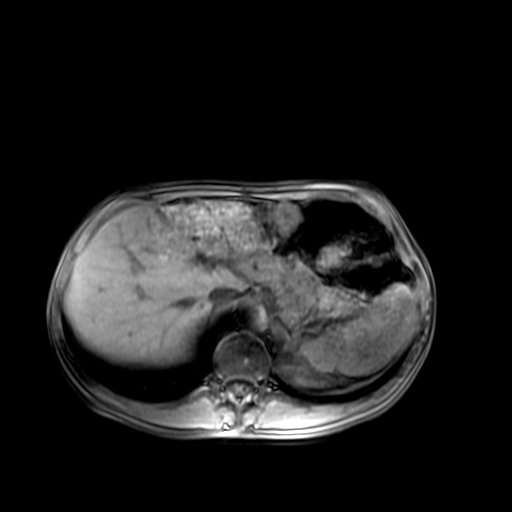
[im 52/52]
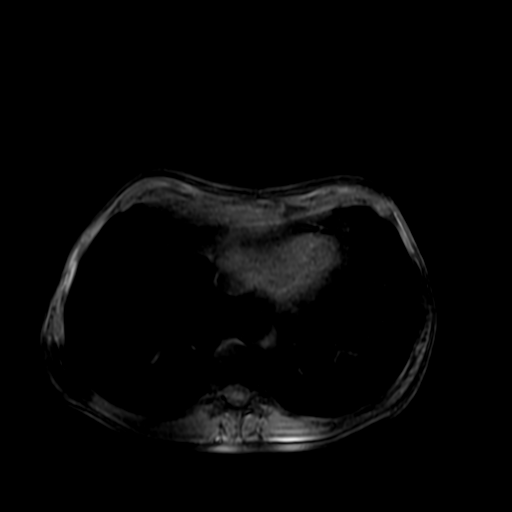

[Series 18: (person_name) pre · coronal · non-contrast · 9.0mm · 0.78mm/px · 2 of 46 slices shown]
[im 1/46]
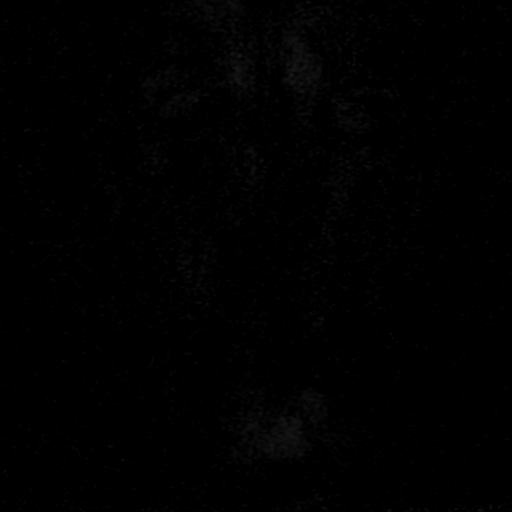
[im 12/46]
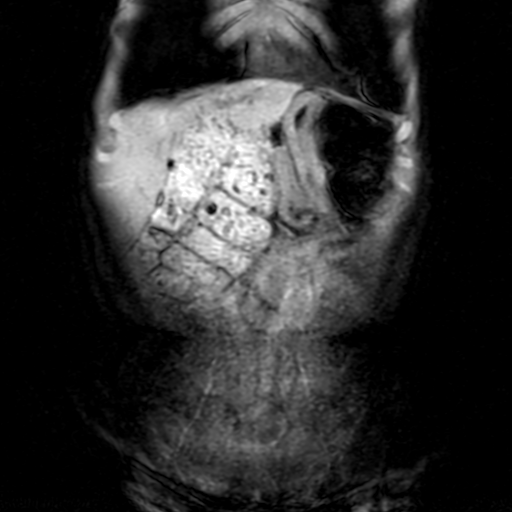

[34 of 48 positions shown; findings below may reference images not displayed]

FINDINGS: Subcentimeter low attenuation area within the superior right hepatic lobe is too small to be adequately characterized. Gallbladder is surgically absent. Spleen, pancreas, adrenal glands and kidneys are normal. No abnormal enhancement is noted. There is no ascites or adenopathy. No pleural effusion is identified.
IMPRESSION: Unremarkable exam.

## 2020-08-20 NOTE — Telephone Encounter (Signed)
 Thanks. Sent in     Electronically signed by: Emeline Norleen Speck, MD  08/20/20 1124

## 2020-08-20 NOTE — Telephone Encounter (Signed)
 Thanks. Please let her know this is normal which is reassuring.  Continue pancreatic supplementation that I can call in or her other provider can do so, her preference.   Thanks all!     Electronically signed by: Emeline Norleen Speck, MD  08/20/20 [phone]

## 2020-11-18 NOTE — Telephone Encounter (Signed)
 Unclear how to interpret mail order test.  Perhaps ordered by [facility]?   Has not responded to abx for possible SIBO in past.  I'm not confidant this would be helpful.   Would need to clarify if still following with [facility] as we discussed in the past, best not to confuse care and rather pursue care under one group and her prior decision was [facility].   Thanks      Electronically signed by: Emeline Norleen Speck, MD  11/18/20 1007

## 2021-01-17 ENCOUNTER — Other Ambulatory Visit: Payer: Self-pay

## 2021-01-18 ENCOUNTER — Other Ambulatory Visit: Payer: Self-pay

## 2021-01-18 MED ORDER — DULOXETINE 30 MG CAPSULE,DELAYED RELEASE
DELAYED_RELEASE_CAPSULE | ORAL | 2 refills | Status: DC
Start: 2020-12-14 — End: 2021-09-21
  Filled 2021-02-07: qty 90, 30d supply, fill #0

## 2021-01-18 MED ORDER — FERROUS SULFATE 325 MG (65 MG IRON) TABLET
ORAL_TABLET | ORAL | 2 refills | Status: DC
Start: 2020-11-23 — End: 2021-09-21
  Filled 2021-02-07: qty 30, 30d supply, fill #0

## 2021-01-18 MED ORDER — CELECOXIB 200 MG CAPSULE
ORAL_CAPSULE | ORAL | 2 refills | Status: DC
Start: 2020-07-20 — End: 2021-06-13
  Filled 2021-02-21 – 2021-03-19 (×2): qty 180, 90d supply, fill #0

## 2021-01-18 MED ORDER — CREON 12,000-38,000-60,000 UNIT CAPSULE,DELAYED RELEASE
DELAYED_RELEASE_CAPSULE | ORAL | 0 refills | Status: DC
Start: 2021-01-10 — End: 2021-04-12
  Filled 2021-02-07: qty 180, 30d supply, fill #0

## 2021-01-18 MED ORDER — BUPROPION HCL XL 300 MG 24 HR TABLET, EXTENDED RELEASE
ORAL_TABLET | ORAL | 2 refills | Status: DC
Start: 2020-12-30 — End: 2021-09-14
  Filled 2021-02-07: qty 90, 90d supply, fill #0
  Filled 2021-05-16: qty 90, 90d supply, fill #1
  Filled 2021-08-06: qty 90, 90d supply, fill #2

## 2021-01-18 MED ORDER — PROGESTERONE MICRONIZED 100 MG CAPSULE
ORAL_CAPSULE | ORAL | 4 refills | Status: DC
Start: 2020-12-13 — End: 2023-07-31
  Filled 2021-02-07: qty 30, 30d supply, fill #0
  Filled 2021-02-21: qty 30, 25d supply, fill #0
  Filled 2021-04-17: qty 30, 25d supply, fill #1
  Filled 2021-05-16: qty 30, 25d supply, fill #2

## 2021-01-18 MED ORDER — ESTRADIOL 0.0375 MG/24 HR SEMIWEEKLY TRANSDERMAL PATCH
MEDICATED_PATCH | TRANSDERMAL | 4 refills | Status: DC
Start: 2020-12-13 — End: 2022-07-18
  Filled 2021-02-07: qty 8, 30d supply, fill #0
  Filled 2021-02-21 – 2021-03-19 (×2): qty 8, 30d supply, fill #1
  Filled 2021-09-30 – 2021-10-22 (×2): qty 8, 30d supply, fill #2
  Filled 2021-11-18: qty 8, 30d supply, fill #3

## 2021-01-18 MED ORDER — LOVASTATIN 10 MG TABLET
10.0000 mg | ORAL_TABLET | ORAL | 1 refills | Status: DC
Start: 2020-10-12 — End: 2021-09-21
  Filled 2021-02-07 – 2021-02-21 (×2): qty 90, 90d supply, fill #0

## 2021-01-18 MED ORDER — ERYTHROMYCIN 250 MG TABLET,DELAYED RELEASE
DELAYED_RELEASE_TABLET | ORAL | 0 refills | Status: DC
Start: 2021-01-10 — End: 2021-09-21
  Filled 2021-02-02: qty 30, 30d supply, fill #0

## 2021-01-18 MED ORDER — PANTOPRAZOLE 20 MG TABLET,DELAYED RELEASE
DELAYED_RELEASE_TABLET | ORAL | 3 refills | Status: DC
Start: 2021-01-04 — End: 2022-05-15
  Filled 2021-02-07: qty 90, 90d supply, fill #0
  Filled 2021-05-16: qty 90, 90d supply, fill #1
  Filled 2021-10-22: qty 90, 90d supply, fill #2

## 2021-01-18 MED ORDER — ESTRADIOL-NORETHINDRONE ACET 0.5 MG-0.1 MG TABLET
ORAL_TABLET | ORAL | 1 refills | Status: DC
Start: 2020-07-13 — End: 2022-03-14
  Filled 2021-02-07: qty 84, 84d supply, fill #0

## 2021-01-19 ENCOUNTER — Other Ambulatory Visit: Payer: Self-pay

## 2021-02-02 ENCOUNTER — Other Ambulatory Visit: Payer: Self-pay

## 2021-02-03 ENCOUNTER — Other Ambulatory Visit: Payer: Self-pay

## 2021-02-04 ENCOUNTER — Other Ambulatory Visit: Payer: Self-pay

## 2021-02-07 ENCOUNTER — Other Ambulatory Visit: Payer: Self-pay

## 2021-02-08 ENCOUNTER — Other Ambulatory Visit: Payer: Self-pay

## 2021-02-09 ENCOUNTER — Other Ambulatory Visit: Payer: Self-pay

## 2021-02-09 MED ORDER — HYDROXYCHLOROQUINE 200 MG TABLET
ORAL_TABLET | ORAL | 1 refills | Status: DC
Start: 2021-02-08 — End: 2021-09-21
  Filled 2021-02-09: qty 180, 90d supply, fill #0
  Filled 2021-05-16: qty 180, 90d supply, fill #1

## 2021-02-11 ENCOUNTER — Other Ambulatory Visit: Payer: Self-pay

## 2021-02-21 ENCOUNTER — Other Ambulatory Visit: Payer: Self-pay

## 2021-02-22 ENCOUNTER — Other Ambulatory Visit: Payer: Self-pay

## 2021-02-23 ENCOUNTER — Other Ambulatory Visit: Payer: Self-pay

## 2021-02-28 ENCOUNTER — Other Ambulatory Visit: Payer: Self-pay

## 2021-02-28 MED ORDER — LOVASTATIN 10 MG TABLET
10.0000 mg | ORAL_TABLET | Freq: Every day | ORAL | 2 refills | Status: DC
Start: 2021-02-28 — End: 2021-12-20
  Filled 2021-02-28 – 2021-06-13 (×2): qty 90, 90d supply, fill #0
  Filled 2021-09-13: qty 90, 90d supply, fill #1

## 2021-03-01 ENCOUNTER — Other Ambulatory Visit: Payer: Self-pay

## 2021-03-05 ENCOUNTER — Other Ambulatory Visit: Payer: Self-pay

## 2021-03-14 ENCOUNTER — Other Ambulatory Visit: Payer: Self-pay

## 2021-03-16 ENCOUNTER — Other Ambulatory Visit: Payer: Self-pay

## 2021-03-16 MED ORDER — XIIDRA 5 % EYE DROPS IN A DROPPERETTE
OPHTHALMIC | 5 refills | Status: AC
Start: 2021-03-16 — End: ?
  Filled 2021-03-16: qty 60, 30d supply, fill #0
  Filled 2021-05-16 – 2021-06-13 (×2): qty 60, 30d supply, fill #1
  Filled 2021-07-14: qty 60, 30d supply, fill #2
  Filled 2021-09-30 – 2021-10-22 (×2): qty 60, 30d supply, fill #3
  Filled 2021-11-18: qty 60, 30d supply, fill #4
  Filled 2021-12-20: qty 60, 30d supply, fill #5

## 2021-03-17 ENCOUNTER — Other Ambulatory Visit: Payer: Self-pay

## 2021-03-19 ENCOUNTER — Other Ambulatory Visit: Payer: Self-pay

## 2021-03-21 ENCOUNTER — Other Ambulatory Visit: Payer: Self-pay

## 2021-03-24 ENCOUNTER — Other Ambulatory Visit: Payer: Self-pay

## 2021-03-29 ENCOUNTER — Other Ambulatory Visit: Payer: Self-pay

## 2021-04-03 ENCOUNTER — Other Ambulatory Visit: Payer: Self-pay

## 2021-04-04 ENCOUNTER — Other Ambulatory Visit: Payer: Self-pay

## 2021-04-09 ENCOUNTER — Other Ambulatory Visit: Payer: Self-pay

## 2021-04-12 ENCOUNTER — Other Ambulatory Visit: Payer: Self-pay

## 2021-04-12 MED ORDER — CREON 12,000-38,000-60,000 UNIT CAPSULE,DELAYED RELEASE
DELAYED_RELEASE_CAPSULE | ORAL | 5 refills | Status: DC
Start: 2021-03-23 — End: 2021-11-22
  Filled 2021-04-12: qty 180, 90d supply, fill #0
  Filled 2021-06-27: qty 180, 30d supply, fill #1
  Filled 2021-08-06: qty 180, 30d supply, fill #2
  Filled 2021-09-13: qty 180, 30d supply, fill #3
  Filled 2021-10-22: qty 180, 30d supply, fill #4
  Filled 2021-11-18: qty 180, 30d supply, fill #5

## 2021-04-13 ENCOUNTER — Other Ambulatory Visit: Payer: Self-pay

## 2021-04-14 ENCOUNTER — Other Ambulatory Visit: Payer: Self-pay

## 2021-04-17 ENCOUNTER — Other Ambulatory Visit: Payer: Self-pay

## 2021-04-18 ENCOUNTER — Other Ambulatory Visit: Payer: Self-pay

## 2021-04-19 ENCOUNTER — Other Ambulatory Visit: Payer: Self-pay

## 2021-04-21 ENCOUNTER — Other Ambulatory Visit: Payer: Self-pay

## 2021-04-21 MED ORDER — CICLOPIROX 8 % TOPICAL SOLUTION
CUTANEOUS | 2 refills | Status: DC
Start: 2021-04-21 — End: 2022-12-07
  Filled 2021-04-21: qty 6.6, 30d supply, fill #0
  Filled 2021-05-16 – 2021-09-03 (×2): qty 6.6, 30d supply, fill #1
  Filled 2021-11-05: qty 6.6, 30d supply, fill #2

## 2021-04-21 MED ORDER — ESTRADIOL 0.0375 MG/24 HR SEMIWEEKLY TRANSDERMAL PATCH
MEDICATED_PATCH | TRANSDERMAL | 3 refills | Status: DC
Start: 2021-04-21 — End: 2021-09-21
  Filled 2021-04-21 (×2): qty 24, 84d supply, fill #0
  Filled 2021-07-14: qty 24, 84d supply, fill #1

## 2021-04-21 MED ORDER — PROGESTERONE MICRONIZED 100 MG CAPSULE
ORAL_CAPSULE | ORAL | 3 refills | Status: DC
Start: 2021-04-21 — End: 2022-07-18
  Filled 2021-06-13: qty 75, 90d supply, fill #0
  Filled 2021-09-13: qty 75, 90d supply, fill #1
  Filled 2021-12-20: qty 75, 90d supply, fill #2

## 2021-04-24 ENCOUNTER — Other Ambulatory Visit: Payer: Self-pay

## 2021-04-29 ENCOUNTER — Other Ambulatory Visit: Payer: Self-pay

## 2021-05-04 ENCOUNTER — Other Ambulatory Visit: Payer: Self-pay

## 2021-05-13 ENCOUNTER — Other Ambulatory Visit: Payer: Self-pay

## 2021-05-14 ENCOUNTER — Other Ambulatory Visit: Payer: Self-pay

## 2021-05-14 MED ORDER — ITRACONAZOLE 100 MG CAPSULE
100.0000 mg | ORAL_CAPSULE | Freq: Two times a day (BID) | ORAL | 0 refills | Status: DC
Start: 2021-05-14 — End: 2022-12-07
  Filled 2021-05-14: qty 180, 90d supply, fill #0

## 2021-05-16 ENCOUNTER — Other Ambulatory Visit: Payer: Self-pay

## 2021-05-17 ENCOUNTER — Other Ambulatory Visit: Payer: Self-pay

## 2021-05-18 ENCOUNTER — Other Ambulatory Visit: Payer: Self-pay

## 2021-06-01 ENCOUNTER — Other Ambulatory Visit: Payer: Self-pay

## 2021-06-13 ENCOUNTER — Other Ambulatory Visit (RURAL_HEALTH_CENTER): Payer: Self-pay | Admitting: Internal Medicine

## 2021-06-13 ENCOUNTER — Other Ambulatory Visit: Payer: Self-pay

## 2021-06-13 MED FILL — celecoxib 200 mg capsule: ORAL | 90 days supply | Qty: 180 | Fill #0 | Status: AC

## 2021-06-14 ENCOUNTER — Other Ambulatory Visit: Payer: Self-pay

## 2021-06-15 ENCOUNTER — Other Ambulatory Visit: Payer: Self-pay

## 2021-06-16 ENCOUNTER — Other Ambulatory Visit: Payer: Self-pay

## 2021-06-18 ENCOUNTER — Other Ambulatory Visit: Payer: Self-pay

## 2021-06-20 ENCOUNTER — Other Ambulatory Visit: Payer: Self-pay

## 2021-06-20 ENCOUNTER — Other Ambulatory Visit (INDEPENDENT_AMBULATORY_CARE_PROVIDER_SITE_OTHER): Payer: Self-pay | Admitting: Internal Medicine

## 2021-06-20 MED FILL — celecoxib 200 mg capsule: ORAL | Qty: 180 | Fill #0 | Status: CN

## 2021-06-23 ENCOUNTER — Other Ambulatory Visit: Payer: Self-pay

## 2021-06-27 ENCOUNTER — Other Ambulatory Visit: Payer: Self-pay

## 2021-06-27 ENCOUNTER — Other Ambulatory Visit (INDEPENDENT_AMBULATORY_CARE_PROVIDER_SITE_OTHER): Payer: Self-pay | Admitting: Internal Medicine

## 2021-06-28 ENCOUNTER — Other Ambulatory Visit: Payer: Self-pay

## 2021-07-03 ENCOUNTER — Other Ambulatory Visit: Payer: Self-pay

## 2021-07-04 ENCOUNTER — Other Ambulatory Visit: Payer: Self-pay

## 2021-07-04 MED ORDER — NP THYROID 30 MG TABLET
ORAL_TABLET | ORAL | 1 refills | Status: DC
Start: 2021-07-04 — End: 2021-09-21
  Filled 2021-07-04: qty 90, 90d supply, fill #0

## 2021-07-05 ENCOUNTER — Other Ambulatory Visit: Payer: Self-pay

## 2021-07-05 ENCOUNTER — Other Ambulatory Visit (INDEPENDENT_AMBULATORY_CARE_PROVIDER_SITE_OTHER): Payer: Self-pay | Admitting: Internal Medicine

## 2021-07-05 MED ORDER — THYROID (PORK) 30 MG TABLET
30.0000 mg | ORAL_TABLET | Freq: Every day | ORAL | 0 refills | Status: DC
Start: 2021-07-05 — End: 2021-09-21

## 2021-07-07 ENCOUNTER — Other Ambulatory Visit: Payer: Self-pay

## 2021-07-07 ENCOUNTER — Other Ambulatory Visit (HOSPITAL_COMMUNITY): Payer: Self-pay | Admitting: Nurse Practitioner

## 2021-07-07 ENCOUNTER — Other Ambulatory Visit (HOSPITAL_COMMUNITY): Payer: Self-pay

## 2021-07-07 MED ORDER — NP THYROID 30 MG TABLET
ORAL_TABLET | ORAL | 2 refills | Status: DC
Start: 2021-07-07 — End: 2022-04-02
  Filled 2021-07-07 – 2021-09-30 (×2): qty 90, 90d supply, fill #0

## 2021-07-07 MED ORDER — CICLOPIROX 8 % TOPICAL SOLUTION
CUTANEOUS | 1 refills | Status: DC
Start: 2021-07-07 — End: 2022-12-20
  Filled 2021-07-07: qty 6.6, 30d supply, fill #0
  Filled 2021-12-20: qty 6.6, 30d supply, fill #1

## 2021-07-08 ENCOUNTER — Other Ambulatory Visit: Payer: Self-pay

## 2021-07-13 ENCOUNTER — Other Ambulatory Visit: Payer: Self-pay

## 2021-07-14 ENCOUNTER — Other Ambulatory Visit: Payer: Self-pay

## 2021-08-06 ENCOUNTER — Other Ambulatory Visit: Payer: Self-pay

## 2021-08-08 ENCOUNTER — Other Ambulatory Visit: Payer: Self-pay

## 2021-08-09 ENCOUNTER — Other Ambulatory Visit: Payer: Self-pay

## 2021-08-10 ENCOUNTER — Other Ambulatory Visit: Payer: Self-pay

## 2021-08-11 ENCOUNTER — Other Ambulatory Visit: Payer: Self-pay

## 2021-08-16 ENCOUNTER — Other Ambulatory Visit: Payer: Self-pay

## 2021-08-21 ENCOUNTER — Other Ambulatory Visit: Payer: Self-pay

## 2021-08-22 ENCOUNTER — Other Ambulatory Visit: Payer: Self-pay

## 2021-08-23 ENCOUNTER — Other Ambulatory Visit: Payer: Self-pay

## 2021-08-27 ENCOUNTER — Other Ambulatory Visit: Payer: Self-pay

## 2021-09-01 ENCOUNTER — Other Ambulatory Visit: Payer: Self-pay

## 2021-09-03 ENCOUNTER — Other Ambulatory Visit: Payer: Self-pay

## 2021-09-05 ENCOUNTER — Telehealth (INDEPENDENT_AMBULATORY_CARE_PROVIDER_SITE_OTHER): Payer: Self-pay | Admitting: Internal Medicine

## 2021-09-05 ENCOUNTER — Other Ambulatory Visit: Payer: Self-pay

## 2021-09-06 ENCOUNTER — Other Ambulatory Visit: Payer: Self-pay

## 2021-09-07 ENCOUNTER — Other Ambulatory Visit: Payer: Self-pay

## 2021-09-07 ENCOUNTER — Other Ambulatory Visit (INDEPENDENT_AMBULATORY_CARE_PROVIDER_SITE_OTHER): Payer: Self-pay | Admitting: Internal Medicine

## 2021-09-07 DIAGNOSIS — Z0000 Encounter for general adult medical examination without abnormal findings: Secondary | ICD-10-CM

## 2021-09-07 NOTE — Telephone Encounter (Signed)
notified

## 2021-09-08 ENCOUNTER — Other Ambulatory Visit: Payer: Self-pay

## 2021-09-08 MED ORDER — NEOMYCIN 500 MG TABLET
ORAL_TABLET | ORAL | 0 refills | Status: DC
Start: 2021-09-08 — End: 2021-09-21
  Filled 2021-09-08: qty 28, 14d supply, fill #0

## 2021-09-08 MED ORDER — XIFAXAN 550 MG TABLET
ORAL_TABLET | ORAL | 0 refills | Status: DC
Start: 2021-09-08 — End: 2021-09-21
  Filled 2021-09-08 – 2021-09-14 (×3): qty 42, 14d supply, fill #0

## 2021-09-13 ENCOUNTER — Other Ambulatory Visit: Payer: Self-pay

## 2021-09-13 ENCOUNTER — Other Ambulatory Visit (INDEPENDENT_AMBULATORY_CARE_PROVIDER_SITE_OTHER): Payer: Self-pay | Admitting: Internal Medicine

## 2021-09-13 MED FILL — celecoxib 200 mg capsule: ORAL | 90 days supply | Qty: 180 | Fill #0 | Status: AC

## 2021-09-14 ENCOUNTER — Other Ambulatory Visit: Payer: Self-pay

## 2021-09-14 MED FILL — buPROPion HCL XL 300 mg 24 hr tablet, extended release: ORAL | Qty: 90 | Fill #0 | Status: CN

## 2021-09-15 ENCOUNTER — Other Ambulatory Visit (INDEPENDENT_AMBULATORY_CARE_PROVIDER_SITE_OTHER): Payer: Self-pay | Admitting: Internal Medicine

## 2021-09-15 ENCOUNTER — Other Ambulatory Visit: Payer: Self-pay

## 2021-09-15 DIAGNOSIS — E875 Hyperkalemia: Secondary | ICD-10-CM

## 2021-09-18 ENCOUNTER — Other Ambulatory Visit: Payer: Self-pay

## 2021-09-20 ENCOUNTER — Other Ambulatory Visit: Payer: Self-pay

## 2021-09-20 ENCOUNTER — Other Ambulatory Visit (INDEPENDENT_AMBULATORY_CARE_PROVIDER_SITE_OTHER): Payer: Self-pay | Admitting: Internal Medicine

## 2021-09-20 MED FILL — buPROPion HCL XL 300 mg 24 hr tablet, extended release: ORAL | Qty: 90 | Fill #0 | Status: CN

## 2021-09-21 ENCOUNTER — Encounter (INDEPENDENT_AMBULATORY_CARE_PROVIDER_SITE_OTHER): Payer: Self-pay | Admitting: Internal Medicine

## 2021-09-21 ENCOUNTER — Other Ambulatory Visit: Payer: Self-pay

## 2021-09-21 ENCOUNTER — Telehealth (INDEPENDENT_AMBULATORY_CARE_PROVIDER_SITE_OTHER): Payer: Self-pay | Admitting: Internal Medicine

## 2021-09-21 ENCOUNTER — Ambulatory Visit: Payer: No Typology Code available for payment source | Attending: Internal Medicine | Admitting: Internal Medicine

## 2021-09-21 VITALS — BP 132/72 | HR 87 | Temp 97.9°F | Ht 66.5 in | Wt 130.4 lb

## 2021-09-21 DIAGNOSIS — E782 Mixed hyperlipidemia: Secondary | ICD-10-CM | POA: Insufficient documentation

## 2021-09-21 DIAGNOSIS — K219 Gastro-esophageal reflux disease without esophagitis: Secondary | ICD-10-CM | POA: Insufficient documentation

## 2021-09-21 DIAGNOSIS — K6389 Other specified diseases of intestine: Secondary | ICD-10-CM | POA: Insufficient documentation

## 2021-09-21 DIAGNOSIS — E538 Deficiency of other specified B group vitamins: Secondary | ICD-10-CM | POA: Insufficient documentation

## 2021-09-21 DIAGNOSIS — R5383 Other fatigue: Secondary | ICD-10-CM | POA: Insufficient documentation

## 2021-09-21 DIAGNOSIS — E559 Vitamin D deficiency, unspecified: Secondary | ICD-10-CM | POA: Insufficient documentation

## 2021-09-21 DIAGNOSIS — K638219 Small intestinal bacterial overgrowth, unspecified: Secondary | ICD-10-CM

## 2021-09-21 DIAGNOSIS — M1990 Unspecified osteoarthritis, unspecified site: Secondary | ICD-10-CM | POA: Insufficient documentation

## 2021-09-21 MED ORDER — DICYCLOMINE 10 MG CAPSULE
10.0000 mg | ORAL_CAPSULE | Freq: Four times a day (QID) | ORAL | 1 refills | Status: DC
Start: 2021-09-21 — End: 2021-12-20

## 2021-09-21 MED ORDER — HYDROXYCHLOROQUINE 200 MG TABLET
200.0000 mg | ORAL_TABLET | Freq: Two times a day (BID) | ORAL | 1 refills | Status: DC
Start: 2021-09-21 — End: 2022-03-14
  Filled 2021-09-21: qty 180, 90d supply, fill #0
  Filled 2021-12-20: qty 180, 90d supply, fill #1

## 2021-09-21 MED ORDER — SHINGRIX (PF) 50 MCG/0.5 ML INTRAMUSCULAR SUSPENSION, KIT
0.5000 mL | INHALATION_SUSPENSION | Freq: Once | INTRAMUSCULAR | 1 refills | Status: AC
Start: 2021-09-21 — End: 2021-09-21

## 2021-09-21 MED ORDER — PREVNAR 20 (PF) 0.5 ML INTRAMUSCULAR SYRINGE
0.5000 mL | INJECTION | Freq: Once | INTRAMUSCULAR | 0 refills | Status: AC
Start: 2021-09-21 — End: 2021-09-21

## 2021-09-21 NOTE — Progress Notes (Signed)
Name: Sonya Parks                       Date of Birth: 1955/10/06   MRN:  [id number]                         Date of visit: 09/21/2021     PCP: Samuella Cota, PA-C     Subjective  Sonya Parks is a 66 y.o. year old female who presents for Follow Up 3 Months (Ask about plaquel if you can write it /Still having leg problems and tingling down the right arm/Taking creon  talk about it/Shingles shot  should she get it.)   to clinic.  No specialty comments available.   Patient Active Problem List    Diagnosis Date Noted    Fatigue 10/09/2021    Small intestinal bacterial overgrowth (SIBO) 10/09/2021    B12 deficiency 10/09/2021    Mixed hyperlipidemia 09/21/2021    Esophageal reflux 09/21/2021    Vitamin D deficiency 09/21/2021    Degenerative arthritis 09/21/2021      Current Outpatient Medications   Medication Sig    buPROPion (WELLBUTRIN XL) 300 mg extended release 24 hr tablet TAKE 1 TABLET BY MOUTH EVERY MORNING    busPIRone (BUSPAR) 15 mg Oral Tablet Take 1 Tablet (15 mg total) by mouth Twice daily for 30 days    celecoxib (CELEBREX) 200 mg Oral Capsule TAKE 1 CAPSULE BY MOUTH TWICE DAILY    ciclopirox (PENLAC) 8 % Solution Apply to affected nails daily; Wipe off every 7 days with alcohol and start daily process again    ciclopirox (PENLAC) 8 % Solution Apply to affected nails daily; Wipe off every 7 days with alcohol and start daily process again    dicyclomine (BENTYL) 10 mg Oral Capsule Take 1 Capsule (10 mg total) by mouth Four times a day    estradioL (VIVELLE-DOT) 0.0375 mg/24 hr Transdermal Patch Semiweekly APPLY 1 PATCH TOPICALLY TO CLEAN, DRY, AND HAIR-FREE AREA OF THE LOWER STOMACH OR UPPER BUTTOCK AREA TWICE A WEEK (TUESDAY & FRIDAY)    Estradiol-Norethindrone Acet (AMABELZ) 0.5-0.1 mg Oral Tablet TAKE 1 TABLET BY MOUTH ONCE DAILY    hydroxychloroquine (PLAQUENIL) 200 mg Oral Tablet Take 1 Tablet (200 mg total)  by mouth Twice daily    itraconazole (SPORONAX) 100 mg Oral Capsule Take one tablet by mouth twice daily - Stop Erythromycin decrease Estrogen to 1/2 patch twice weekly Avoid PPIs like Omeprazole    lifitegrast (XIIDRA) 5 % Ophthalmic Dropperette Use 1 drop in both eyes twice daily    Lovastatin (MEVACOR) 10 mg Oral Tablet Take 1 Tablet (10 mg total) by mouth Once a day    naltrexone 4.5 mg Oral Capsule Take 1 Capsule (4.5 mg total) by mouth Once a day    pancreatic enzyme replacement (CREON) 12,000-38,000 -60,000 unit Oral Capsule, Delayed Release(E.C.) Take 2 capsules by mouth three times daily with meals    pantoprazole (PROTONIX) 20 mg Oral Tablet, Delayed Release (E.C.) TAKE 1 TABLET BY MOUTH ONCE DAILY    progesterone micronized (PROMETRIUM) 100 mg Oral Capsule TAKE 1 CAPSULE BY MOUTH ONCE DAILY AT BEDTIME ON DAYS 1-25 OF CALENDAR MONTH    progesterone micronized (PROMETRIUM) 100 mg Oral Capsule Take one capsule at bedtime days 1-25 of calendar month    thyroid (NP THYROID) 30 mg Oral Tablet Take 1 tablet in the morning 20-30 mins prior to breakfast  Chronic Disease Management-AMB    Follow-up visit  Follow-up visit with a 15year-old white female multiple medical problems    Fu IGA Deficiency         Seeing  Dr Helane Rima is low      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  (pt having trouble getting the medication due to insurance)  Pt still has  flactualnce  and bloating        Fu Low  thyroid  Pt on np thyroid and  doing well    Fu Candidiasis    S/p itraconazole  x  3 mo for candidiasis  in gut        Hair and nails doing better    Co Abdominal  pain       Add bentyl  10 mg  qid  prn for crampy abd  pain       Severe  pain  happens   q  58mo   pt has  old tramadol prn    Dr Karna Christmas  sees pt for above      Dr Artis Flock  Rheumatology  [facility]       Dr Julius Bowels  is Ortho   [facility]  s/p hip replacement      Fu  abnormal serum electrophoresis   seen by Dr Tasia Catchings  and she was treated with Infusion of gamma globulins  in past   Now following with new Hematologist     Eye exam  q 63mo and sees  Dr  Micheline Rough       Skin sensation  ankles  mid  calf and   right  arm   stays numb  now    chronic  neck pain  has seen  chiropractor in past      Prevnar 20 and shingrix  sent in      Follow up Hyponatremia     Pt's labs have been better  and she has been feeling better    Follow-up SIBO    Followed at  [facility]     LOW ELASTIN  LEVEL AND STARTED ON  OTC   DIGESTIVE ENZYME  MEGA  2000  DAILY   PRE MEAL  AND SNACKS   YEAST  + TREATED   AND  DIFLUCAN 100 MG  X  2 WEEKS DAILY   QOD  NOW  X  7 MORE  DOSES  AND IT HELPED        11/27/19   Dr Pecola Leisure  at [facility]   Integrative  practioner     July   16th   than  took  xifaxin    550 tid  july   17th    2 weeks    gain on  xifaximin  550  tid    sept  15th   neomycin  and    2 weeks   due to  levels   and  gas bloating  and  symptoms       Now on  herbal treatment now    good  bm and   now  on  Plaquenil   daily   due to  T cells               breath test last on  11/04/19   still high but lower  Update  on SIBO  pt was  started  again  with new provider   at [facility]  11/20/18  and had   bacterial overgrowth test  and  now  on   flagyl 250 tid  and neomycin  as well pt has  been  better but still getting  bloating     Pt now  following  with  Dr Mathews Argyle   [facility]  Pt has been  off diet some but now on  no  carbs  no sugars  etc      Hx of SIBO  patient was at this time has beat this and is doing very well she is on specific diet after  120 days of treatment and followed with Dr. Liz Malady at [facility] patient was following with him regarding her gas bloating abdominal pain patient's had a very long course of Flagyl and neomycin her recent HPT test showed resolution of SIBO her symptoms did improve but she still states that she has significant fatigue she feels much better when she is on antibiotic today we discussed acidophilus.        Follow-up depression anxiety patient was on Cymbalta 60 mg we did  increase to 90 mg to see if that would help with the joint pain as well and the She felt tired  so  it was tapered of f Currently  she is on Wellbutrin but she is not sure it is enough    Follow-up GERD stable needs back on  protonix and pepcid ( was on  40 mg  down  on  20 mg  )      as  has had  gi  issues   s/p  egd   she wants  to come off  meds  if can   She will need  to  ask  GI       Follow-up AR no new symptoms  but  has been dry      Follow-up vitamin D deficiency on D supplement      FU Mild   Hyperlipidemia   she  wanted to discuss lovastatin  re chol   and her SIBO    pt  has  family  hx of  heart disease        fu fatigue  still  tired at most times           REVIEW OF SYSTEMS:   Review of Systems  General: No fever.  No chills.  No weight changes.  HEENT: No vision changes.  Cardiac: No chest pain. No palpitations.  No dizziness.  No light-headedness.  No near syncope.  Resp: No dyspnea at rest, no dyspnea on exertion; no cough or hemoptysis; no orthopnea or PND.  GI: No N/V. No melena.  No bright red blood per rectum.gas bloating abdominal pain   Ext: No edema.  No claudication.  Neuro: No focal weakness.  No numbness.  All other ROS negative.      Objective:   BP 132/72 (Site: Left, Patient Position: Sitting)   Pulse 87   Temp 36.6 C (97.9 F) (Tympanic)   Ht 1.689 m (5' 6.5")   Wt 59.1 kg (130 lb 6.4 oz)   SpO2 96%   BMI 20.73 kg/m              PHYSICAL EXAM  Physical Exam  Gen: NAD. Alert.   HEENT: PERRL; conjunctivae clear. No JVD or carotid bruit.  Cardiac: RRR with normal S1, S2.   Lungs: Clear to auscultation bilaterally. No rales. No wheezing. No rhonchi.  Abdomen: Soft,  non-tender.non-distended  nl bowel sounds  minimal epigastric  bloating   Extremities: No edema. No cyanosis. No clubbing.  Neurologic:  Grossly intact    Assessment/Plan  Assessment/Plan   1. Mixed hyperlipidemia    2. Gastroesophageal reflux disease, unspecified whether esophagitis present    3. Vitamin D deficiency     4. Osteoarthritis, unspecified osteoarthritis type, unspecified site    5. Fatigue, unspecified type    6. Small intestinal bacterial overgrowth (SIBO)    7. B12 deficiency        Meds reviewed as well as labs.  Chart reviewed and updated.   Continue current treatment.  Keep follow-up appointment.   Vaccine hx reviewed.   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  (pt having trouble getting the medication due to insurance)  Pt still has  flactualnce  and bloating      Pt on np thyroid and  doing welll  S/p itraconazole  x  3 mo for candidiasis  in gut      Hair and nails doing better  Add bentyl  10 mg  qid  prn for crampy abd  pain    Severe  pain  happens   q  74mo   pt has  old tramadol prn    Dr Karna Christmas  sees pt for above    Dr Artis Flock  Rheumatology  [facility]     Dr Julius Bowels  is Ortho   [facility]    Eye exam  q 9mo and sees  Dr  Micheline Rough     Skin sensation  ankles  mid  calf and   right  arm   stays numb  now    chronic  neck pain  has seen  chiropractor in past    Prevnar 20 and shingrix  sent in

## 2021-09-25 ENCOUNTER — Other Ambulatory Visit: Payer: Self-pay

## 2021-09-27 ENCOUNTER — Other Ambulatory Visit (INDEPENDENT_AMBULATORY_CARE_PROVIDER_SITE_OTHER): Payer: Self-pay | Admitting: Internal Medicine

## 2021-09-27 ENCOUNTER — Other Ambulatory Visit: Payer: Self-pay

## 2021-09-27 MED FILL — buPROPion HCL XL 300 mg 24 hr tablet, extended release: ORAL | Qty: 90 | Fill #0 | Status: CN

## 2021-09-29 ENCOUNTER — Other Ambulatory Visit (INDEPENDENT_AMBULATORY_CARE_PROVIDER_SITE_OTHER): Payer: Self-pay

## 2021-09-29 DIAGNOSIS — E875 Hyperkalemia: Secondary | ICD-10-CM

## 2021-09-30 ENCOUNTER — Other Ambulatory Visit: Payer: Self-pay

## 2021-10-04 ENCOUNTER — Telehealth (INDEPENDENT_AMBULATORY_CARE_PROVIDER_SITE_OTHER): Payer: Self-pay | Admitting: Internal Medicine

## 2021-10-04 NOTE — Progress Notes (Addendum)
 Patient ID: Sonya Parks is a 66 y.o. female.     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  Other labs showed normal hemoglobin in 2022, normal liver chemistries in 2022, normal hemoglobin A1c in 2022, negative celiac testing, but markedly low pancreatic elastase on 06/22/2020 with undetectably low pancreatic elastase.  An MRI was obtained on 08/16/2020 which was normal (OS[facility]).  She more recently has followed with [facility] and has had numerous tests of unclear significance performed there.  She also has GERD which responds to Protonix  and Pepcid.  She is to obtain yearly FIT testing through her PCP for colon cancer screening given inability to prep for her colonoscopy in the past.    Today she reports:  Was given rifaximin  and neomycin  but not approved.  This was done in response to the IMO seen on the HBT done here  Hyponatremia of unclear etiology, reports she has seen multiple providers for this.  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  She shows me records of an IgA of 57 most recently (which is just below normal via outside hospital levels)  Creon  has helped a lot, able to gain weight, feels as though she is absorbing better  Given NP thyroid  and feels hair is better and antifungal cream for nails as well  Very restrictive diet.   Knows she has tachycardia feels been present since this weekend.  Questions whether or not she may have some anxiety leading to this.    Objective   BP 140/88   Pulse 111 Comment: taken x 2 ; provider notified  Temp 97.5 F (36.4 C) (Temporal)   Ht 1.689 m (5' 6.5)   Wt 59.3 kg (130 lb 12.8 oz)   SpO2 100%   BMI 20.80 kg/m    Tachycardic, otherwise regular  Pleasant, anicteric, no jaundice     Colonoscopy 06/16/19:incomplete bowel prep. continue current laxative regimen, add 17g Miralax to daily routine.  Recommend  alternative CRC screening.   EGD 06/16/19:Normal EGD.  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 No significant change in methane production. Although this level of gas production does not reach the diagnostic cutoff for bacterialovergrowth of . 20 ppm over baseline at or before 1 hour, it suggests increased foregut bacterial colonization.  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  HBT 12/20/17: neg  HBT 10/08/17:Diagnostic for severe bacterial overgrowth with methanogenic bacteria.  HBT 06/20/17:Diagnostic for bacterial overgrowth in a patient colonized with methanogenic bacteria.    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  She is tachycardic today and EKG revealed sinus tachycardia.  I suggested that she reach out to her PCP to further evaluate this.  However I am most concerned this may be a side effect of her thyroid  supplementation provided through [facility], as I am unclear that she was hypothyroid.  I suggested that she consider stopping this.  We will continue Creon  supplementation  and check fat-soluble vitamins with INR and vitamin D .  Recheck sodium with BMP and magnesium given her arrhythmia.  Will repeat FIT test here as she reports this was not done through her PCP       Plan    -d/w PCP regarding tachycardia, recommend reassessing thyroid  supplements through [facility]  -EKG  -INR, vitamin D , BMP, Mg  -FIT for CRC screening  RTC 6 months       Electronically signed by: Emeline Norleen Speck, MD 10/04/2021 10:13 AM        Electronically signed by: Speck Emeline Norleen, MD  10/04/21 1218    ADDENDUM: iFOB WAS NEGATIVE.  REPEAT IN 1 YEAR       Electronically signed by: Speck Emeline Norleen, MD  10/26/21 [phone]

## 2021-10-06 MED ORDER — BUSPIRONE 15 MG TABLET
15.0000 mg | ORAL_TABLET | Freq: Two times a day (BID) | ORAL | 1 refills | Status: DC
Start: 2021-10-06 — End: 2021-12-20

## 2021-10-06 NOTE — Telephone Encounter (Signed)
Pt called regarding  GI  visit    She had a heart rate increase  sinus tachycardia  she has EKG   Pt feels it is anxiety and panic  she used an ativan and it helped    It hit her at church the other day as well     Pt is on  Wellbutrin  but  feels she needs something more   Cymbalta in past caused constipation    Pt denies feeling depressed     Buspar and Paxil discussed    Will start Buspar  15 mg    5mg  bid x   5 days and increase as needed

## 2021-10-07 ENCOUNTER — Other Ambulatory Visit: Payer: Self-pay

## 2021-10-09 DIAGNOSIS — E538 Deficiency of other specified B group vitamins: Secondary | ICD-10-CM | POA: Insufficient documentation

## 2021-10-09 DIAGNOSIS — K638219 Small intestinal bacterial overgrowth, unspecified: Secondary | ICD-10-CM | POA: Insufficient documentation

## 2021-10-09 DIAGNOSIS — R5383 Other fatigue: Secondary | ICD-10-CM

## 2021-10-09 HISTORY — DX: Other fatigue: R53.83

## 2021-10-09 NOTE — Assessment & Plan Note (Signed)
Followed by [facility] 

## 2021-10-17 ENCOUNTER — Other Ambulatory Visit: Payer: Self-pay

## 2021-10-22 ENCOUNTER — Other Ambulatory Visit: Payer: Self-pay

## 2021-10-22 MED FILL — buPROPion HCL XL 300 mg 24 hr tablet, extended release: ORAL | 90 days supply | Qty: 90 | Fill #0 | Status: AC

## 2021-10-24 ENCOUNTER — Other Ambulatory Visit: Payer: Self-pay

## 2021-10-27 ENCOUNTER — Other Ambulatory Visit (INDEPENDENT_AMBULATORY_CARE_PROVIDER_SITE_OTHER): Payer: Self-pay | Admitting: Internal Medicine

## 2021-11-05 ENCOUNTER — Other Ambulatory Visit: Payer: Self-pay

## 2021-11-08 ENCOUNTER — Other Ambulatory Visit: Payer: Self-pay

## 2021-11-08 ENCOUNTER — Encounter (INDEPENDENT_AMBULATORY_CARE_PROVIDER_SITE_OTHER): Payer: Self-pay | Admitting: Internal Medicine

## 2021-11-08 ENCOUNTER — Ambulatory Visit: Payer: No Typology Code available for payment source | Attending: Internal Medicine | Admitting: Internal Medicine

## 2021-11-08 DIAGNOSIS — M79642 Pain in left hand: Secondary | ICD-10-CM

## 2021-11-08 DIAGNOSIS — M79641 Pain in right hand: Secondary | ICD-10-CM

## 2021-11-08 DIAGNOSIS — K638219 Small intestinal bacterial overgrowth, unspecified: Secondary | ICD-10-CM

## 2021-11-08 DIAGNOSIS — R109 Unspecified abdominal pain: Secondary | ICD-10-CM

## 2021-11-08 DIAGNOSIS — M3500 Sicca syndrome, unspecified: Secondary | ICD-10-CM

## 2021-11-08 DIAGNOSIS — M25641 Stiffness of right hand, not elsewhere classified: Secondary | ICD-10-CM

## 2021-11-08 DIAGNOSIS — R140 Abdominal distension (gaseous): Secondary | ICD-10-CM

## 2021-11-08 DIAGNOSIS — K582 Mixed irritable bowel syndrome: Secondary | ICD-10-CM

## 2021-11-08 DIAGNOSIS — M25649 Stiffness of unspecified hand, not elsewhere classified: Secondary | ICD-10-CM | POA: Insufficient documentation

## 2021-11-08 DIAGNOSIS — M79643 Pain in unspecified hand: Secondary | ICD-10-CM | POA: Insufficient documentation

## 2021-11-08 DIAGNOSIS — M25642 Stiffness of left hand, not elsewhere classified: Secondary | ICD-10-CM

## 2021-11-08 MED ORDER — NEOMYCIN 500 MG TABLET
500.0000 mg | ORAL_TABLET | Freq: Two times a day (BID) | ORAL | 1 refills | Status: AC
Start: 2021-11-08 — End: 2021-12-07
  Filled 2021-11-08: qty 28, 14d supply, fill #0
  Filled 2021-11-18: qty 28, 14d supply, fill #1

## 2021-11-08 MED ORDER — RIFAXIMIN 550 MG TABLET
550.0000 mg | ORAL_TABLET | Freq: Three times a day (TID) | ORAL | 0 refills | Status: AC
Start: 2021-11-08 — End: 2021-11-22
  Filled 2021-11-08 – 2022-08-22 (×6): qty 42, 14d supply, fill #0

## 2021-11-08 NOTE — Progress Notes (Incomplete)
Name: Sonya Parks                       Date of Birth: Jul 19, 1955   MRN:  [id number]                         Date of visit: 11/08/2021     PCP: Delaney Meigs, PA-C     Subjective  Sonya Parks is a 66 y.o. year old female who presents for No chief complaint on file.   to clinic.  No specialty comments available.   Patient Active Problem List    Diagnosis Date Noted   . Fatigue 10/09/2021   . Small intestinal bacterial overgrowth (SIBO) 10/09/2021   . B12 deficiency 10/09/2021   . Mixed hyperlipidemia 09/21/2021   . Esophageal reflux 09/21/2021   . Vitamin D deficiency 09/21/2021   . Degenerative arthritis 09/21/2021      Current Outpatient Medications   Medication Sig   . buPROPion (WELLBUTRIN XL) 300 mg extended release 24 hr tablet TAKE 1 TABLET BY MOUTH EVERY MORNING   . busPIRone (BUSPAR) 15 mg Oral Tablet Take 1 Tablet (15 mg total) by mouth Twice daily for 30 days   . celecoxib (CELEBREX) 200 mg Oral Capsule TAKE 1 CAPSULE BY MOUTH TWICE DAILY   . ciclopirox (PENLAC) 8 % Solution Apply to affected nails daily; Wipe off every 7 days with alcohol and start daily process again   . ciclopirox (PENLAC) 8 % Solution Apply to affected nails daily; Wipe off every 7 days with alcohol and start daily process again   . dicyclomine (BENTYL) 10 mg Oral Capsule Take 1 Capsule (10 mg total) by mouth Four times a day   . estradioL (VIVELLE-DOT) 0.0375 mg/24 hr Transdermal Patch Semiweekly APPLY 1 PATCH TOPICALLY TO CLEAN, DRY, AND HAIR-FREE AREA OF THE LOWER STOMACH OR UPPER BUTTOCK AREA TWICE A WEEK ([place])   . Estradiol-Norethindrone Acet (AMABELZ) 0.5-0.1 mg Oral Tablet TAKE 1 TABLET BY MOUTH ONCE DAILY   . hydroxychloroquine (PLAQUENIL) 200 mg Oral Tablet Take 1 Tablet (200 mg total) by mouth Twice daily   . itraconazole (SPORONAX) 100 mg Oral Capsule Take one tablet by mouth twice daily - Stop Erythromycin decrease Estrogen to 1/2 patch twice weekly Avoid PPIs like Omeprazole   . lifitegrast (XIIDRA)  5 % Ophthalmic Dropperette Use 1 drop in both eyes twice daily   . Lovastatin (MEVACOR) 10 mg Oral Tablet Take 1 Tablet (10 mg total) by mouth Once a day   . naltrexone 4.5 mg Oral Capsule Take 1 Capsule (4.5 mg total) by mouth Once a day   . pancreatic enzyme replacement (CREON) 12,000-38,000 -60,000 unit Oral Capsule, Delayed Release(E.C.) Take 2 capsules by mouth three times daily with meals   . pantoprazole (PROTONIX) 20 mg Oral Tablet, Delayed Release (E.C.) TAKE 1 TABLET BY MOUTH ONCE DAILY   . progesterone micronized (PROMETRIUM) 100 mg Oral Capsule TAKE 1 CAPSULE BY MOUTH ONCE DAILY AT BEDTIME ON DAYS 1-25 OF CALENDAR MONTH   . progesterone micronized (PROMETRIUM) 100 mg Oral Capsule Take one capsule at bedtime days 1-25 of calendar month   . thyroid (NP THYROID) 30 mg Oral Tablet Take 1 tablet in the morning 20-30 mins prior to breakfast            Acute FU      FU IBS-C  primarily  and  IBS-D  SIBO  Methane  and Diarrhea    Pt has high methane levels   and  has been  on  rifaxin    and Flagyl or rifaxin or  neomycin   pt's hydrogen  breath test  since  she has been off for  over  1 year due to  insurance has been going up  Last time   Aug  >  100       Pt has had higher episodes  of diarrhea   since she has not been on rifaxin and we have had  difficulty  getting    Dr Jacqulyn Ducking office  has been trying to get the medication but  I understand  she needs [place] to do a PA  on  the medication     Pt was started on  oregano  oil and 2 other  herbals and she  has not been able  to tolerate  this medication   and  has  been severely nauseated and  vomiting    Co Hands  swelling  and flair  up   q  2 mo when she has  a flair  up to  30 days       About   4 x  per  year  Family   hx  of RA     writing   weed pulling  aggravating         REVIEW OF SYSTEMS:   Review of Systems  Review of Systems have been reviewed  ROS  Const  Reports system reviewed and no additional complaints, except as documented  ENT  Reports  system reviewed and no additional complaints, except as documented  Resp  Reports system reviewed and no additional complaints, except as documented  GI  Reports system reviewed and no additional complaints, except as documented   Objective:   There were no vitals taken for this visit.             PHYSICAL EXAM  Physical Exam  Gen: NAD. Alert.   Anxious over not feeling well and  having  recurrent  abdominal pain  swelling bloating and  recurrent nv  diarrhea and constipation     Abd  Nl BS  minimal distended  non-tender       Assessment/Plan  No diagnosis found.    Pt is on very  strict  diet trying  to   combat   the  methane  no  lactose no wheat  barely  or rice  etc  and is constant with limitations on eating and gi symptoms    Pt was over  1 year ago on  neomycin and rifaximin  her symptoms improved  less  abdominal bloating and pain  and improved  > 12months  Pt is followed  at  [facility]  and will continue  to be     Referral to  Rheumatology

## 2021-11-09 ENCOUNTER — Other Ambulatory Visit: Payer: Self-pay

## 2021-11-15 ENCOUNTER — Other Ambulatory Visit: Payer: Self-pay

## 2021-11-18 ENCOUNTER — Other Ambulatory Visit: Payer: Self-pay

## 2021-11-21 ENCOUNTER — Other Ambulatory Visit: Payer: Self-pay

## 2021-11-22 ENCOUNTER — Other Ambulatory Visit: Payer: Self-pay

## 2021-11-22 ENCOUNTER — Telehealth (INDEPENDENT_AMBULATORY_CARE_PROVIDER_SITE_OTHER): Payer: Self-pay | Admitting: Internal Medicine

## 2021-11-22 MED ORDER — CREON 24,000-76,000-120,000 UNIT CAPSULE,DELAYED RELEASE
DELAYED_RELEASE_CAPSULE | ORAL | 0 refills | Status: DC
Start: 2021-11-22 — End: 2021-11-23
  Filled 2021-11-22: qty 90, 30d supply, fill #0

## 2021-11-23 ENCOUNTER — Other Ambulatory Visit: Payer: Self-pay

## 2021-11-23 MED ORDER — CREON 12,000-38,000-60,000 UNIT CAPSULE,DELAYED RELEASE
DELAYED_RELEASE_CAPSULE | ORAL | 3 refills | Status: DC
Start: 2021-11-23 — End: 2022-12-18
  Filled 2021-11-23: qty 120, 40d supply, fill #0
  Filled 2021-12-20: qty 240, 40d supply, fill #0
  Filled 2022-03-08: qty 240, 40d supply, fill #1
  Filled 2022-04-21: qty 240, 40d supply, fill #2
  Filled 2022-06-06: qty 240, 40d supply, fill #3

## 2021-11-23 NOTE — Telephone Encounter (Signed)
 Thanks. I updated the Rx.     Electronically signed by: Cherylle Emeline Rush, MD  11/23/21 [phone]

## 2021-11-30 ENCOUNTER — Other Ambulatory Visit: Payer: Self-pay

## 2021-12-08 ENCOUNTER — Other Ambulatory Visit: Payer: Self-pay

## 2021-12-13 ENCOUNTER — Other Ambulatory Visit (INDEPENDENT_AMBULATORY_CARE_PROVIDER_SITE_OTHER): Payer: Self-pay | Admitting: Internal Medicine

## 2021-12-20 ENCOUNTER — Other Ambulatory Visit (INDEPENDENT_AMBULATORY_CARE_PROVIDER_SITE_OTHER): Payer: Self-pay | Admitting: Internal Medicine

## 2021-12-20 ENCOUNTER — Other Ambulatory Visit: Payer: Self-pay

## 2021-12-20 MED ORDER — DICYCLOMINE 10 MG CAPSULE
10.0000 mg | ORAL_CAPSULE | Freq: Four times a day (QID) | ORAL | 3 refills | Status: DC
Start: 2021-12-20 — End: 2022-12-18
  Filled 2021-12-20: qty 30, 8d supply, fill #0
  Filled 2022-10-16: qty 30, 8d supply, fill #1

## 2021-12-20 MED ORDER — BUSPIRONE 15 MG TABLET
15.0000 mg | ORAL_TABLET | Freq: Two times a day (BID) | ORAL | 3 refills | Status: DC
Start: 2021-12-20 — End: 2022-10-16
  Filled 2021-12-20: qty 60, 30d supply, fill #0
  Filled 2022-04-01: qty 60, 30d supply, fill #1
  Filled 2022-06-06: qty 60, 30d supply, fill #2
  Filled 2022-07-18: qty 60, 30d supply, fill #3

## 2021-12-20 MED FILL — lovastatin 10 mg tablet: ORAL | 90 days supply | Qty: 90 | Fill #0 | Status: AC

## 2021-12-20 MED FILL — celecoxib 200 mg capsule: ORAL | 90 days supply | Qty: 180 | Fill #1 | Status: AC

## 2021-12-21 ENCOUNTER — Other Ambulatory Visit: Payer: Self-pay

## 2021-12-25 ENCOUNTER — Other Ambulatory Visit (INDEPENDENT_AMBULATORY_CARE_PROVIDER_SITE_OTHER): Payer: Self-pay | Admitting: Internal Medicine

## 2021-12-25 ENCOUNTER — Other Ambulatory Visit: Payer: Self-pay

## 2021-12-26 ENCOUNTER — Other Ambulatory Visit: Payer: Self-pay

## 2021-12-30 ENCOUNTER — Other Ambulatory Visit (INDEPENDENT_AMBULATORY_CARE_PROVIDER_SITE_OTHER): Payer: Self-pay | Admitting: Internal Medicine

## 2021-12-30 ENCOUNTER — Other Ambulatory Visit: Payer: Self-pay

## 2021-12-30 MED FILL — lovastatin 10 mg tablet: ORAL | 90 days supply | Qty: 90 | Fill #0 | Status: CN

## 2022-01-03 ENCOUNTER — Other Ambulatory Visit: Payer: Self-pay

## 2022-01-04 ENCOUNTER — Other Ambulatory Visit: Payer: Self-pay

## 2022-01-04 ENCOUNTER — Other Ambulatory Visit (INDEPENDENT_AMBULATORY_CARE_PROVIDER_SITE_OTHER): Payer: Self-pay | Admitting: Internal Medicine

## 2022-01-04 MED FILL — lovastatin 10 mg tablet: ORAL | 90 days supply | Qty: 90 | Fill #0 | Status: CN

## 2022-01-16 ENCOUNTER — Telehealth (INDEPENDENT_AMBULATORY_CARE_PROVIDER_SITE_OTHER): Payer: Self-pay | Admitting: Internal Medicine

## 2022-01-16 ENCOUNTER — Ambulatory Visit: Payer: No Typology Code available for payment source | Attending: Internal Medicine | Admitting: Internal Medicine

## 2022-01-16 ENCOUNTER — Encounter (INDEPENDENT_AMBULATORY_CARE_PROVIDER_SITE_OTHER): Payer: Self-pay | Admitting: Internal Medicine

## 2022-01-16 ENCOUNTER — Other Ambulatory Visit: Payer: Self-pay

## 2022-01-16 DIAGNOSIS — U071 COVID-19: Secondary | ICD-10-CM | POA: Insufficient documentation

## 2022-01-16 MED ORDER — NIRMATRELVIR 300 MG (150 MG X2)-RITONAVIR 100 MG TABLET,DOSE PACK
3.0000 | ORAL_TABLET | Freq: Two times a day (BID) | ORAL | 0 refills | Status: AC
Start: 2022-01-16 — End: 2022-01-21

## 2022-01-16 MED ORDER — CYCLOBENZAPRINE 10 MG TABLET
10.0000 mg | ORAL_TABLET | Freq: Three times a day (TID) | ORAL | 0 refills | Status: AC
Start: 2022-01-16 — End: 2022-01-26

## 2022-01-16 NOTE — Progress Notes (Signed)
[facility]  INTERNAL MEDICINE, BUILDING A  Operated by [facility]  Progress Note    Name: Sonya Parks MRN:  [id number]   Date: 01/16/2022 Age: 67 y.o.          Chief Complaint: Covid-19 Positive (Pt is covid positive symptoms are stuffy nose sore throat sneezing fatigue headache achy no fever that she knows of/Symptoms started Saturday evening was tested at the er in [place] they would not send in paxlovid told she would/Have to contact her primary dr.)       HPI: Sonya Parks is a 67 y.o. female who complains of  testing  +  for covid   19   Pt  says she  started  with   symptoms   Saturday  cough  minimal    nasal drainage   and  ha      Sorethroat  +      no nv    Allergies:  Allergies   Allergen Reactions    Bee Venom Protein (Honey Bee) Swelling     hives    Metoprolol  Other Adverse Reaction (Add comment)     Severe fatigue    Penicillins Rash    Berberine      N/v    Garlic     Oregano Oil      N/v     Poison Ivy Extract Hives/ Urticaria       buPROPion (WELLBUTRIN XL) 300 mg extended release 24 hr tablet, TAKE 1 TABLET BY MOUTH EVERY MORNING  busPIRone (BUSPAR) 15 mg Oral Tablet, Take 1 Tablet (15 mg total) by mouth Twice daily  celecoxib (CELEBREX) 200 mg Oral Capsule, TAKE 1 CAPSULE BY MOUTH TWICE DAILY  ciclopirox (PENLAC) 8 % Solution, Apply to affected nails daily; Wipe off every 7 days with alcohol and start daily process again  ciclopirox (PENLAC) 8 % Solution, Apply to affected nails daily; Wipe off every 7 days with alcohol and start daily process again  dicyclomine (BENTYL) 10 mg Oral Capsule, Take 1 Capsule (10 mg total) by mouth Four times a day  estradioL (VIVELLE-DOT) 0.0375 mg/24 hr Transdermal Patch Semiweekly, APPLY 1 PATCH TOPICALLY TO CLEAN, DRY, AND HAIR-FREE AREA OF THE LOWER STOMACH OR UPPER BUTTOCK AREA TWICE A WEEK (TUESDAY & FRIDAY)  Estradiol-Norethindrone Acet (AMABELZ) 0.5-0.1 mg Oral Tablet, TAKE 1 TABLET BY MOUTH ONCE DAILY  hydroxychloroquine  (PLAQUENIL) 200 mg Oral Tablet, Take 1 Tablet (200 mg total) by mouth Twice daily  itraconazole (SPORONAX) 100 mg Oral Capsule, Take one tablet by mouth twice daily - Stop Erythromycin decrease Estrogen to 1/2 patch twice weekly Avoid PPIs like Omeprazole  lifitegrast (XIIDRA) 5 % Ophthalmic Dropperette, Use 1 drop in both eyes twice daily  pancreatic enzyme replacement (CREON) 12,000-38,000 -60,000 unit Oral Capsule, Delayed Release(E.C.), Take 2 capsules (24,000 units of lipase total) by mouth 3 times daily with meals.  Lovastatin (MEVACOR) 10 mg Oral Tablet, Take 1 Tablet (10 mg total) by mouth Once a day  naltrexone 4.5 mg Oral Capsule, Take 1 Capsule (4.5 mg total) by mouth Once a day  pantoprazole (PROTONIX) 20 mg Oral Tablet, Delayed Release (E.C.), TAKE 1 TABLET BY MOUTH ONCE DAILY  progesterone micronized (PROMETRIUM) 100 mg Oral Capsule, TAKE 1 CAPSULE BY MOUTH ONCE DAILY AT BEDTIME ON DAYS 1-25 OF CALENDAR MONTH  progesterone micronized (PROMETRIUM) 100 mg Oral Capsule, Take one capsule at bedtime days 1-25 of calendar month  thyroid (NP THYROID) 30 mg Oral Tablet, Take  1 tablet in the morning 20-30 mins prior to breakfast    No facility-administered medications prior to visit.       Review of Systems -   ROS  Const  Reports system reviewed and no additional complaints, except as documented  ENT  Reports system reviewed and no additional complaints, except as documented  Resp  Reports system reviewed and no additional complaints, except as documented  GI  Reports system reviewed and no additional complaints, except as documented   OBJECTIVE:  There were no vitals filed for this visit.   Physical Exam     Exam  Const  General: cooperative, no acute distress and alert  Resp  Effort & Inspection: able to speak in complete sentences  Psych  Mental Status: mental status grossly normal  Mood: congruent mood  Affect: normal affect  Attitude: cooperative  Insight: insight good  Judgment: judgment good         ASSESSMENT:     ICD-10-CM    1. COVID-19 virus infection  U07.1           Orders Placed This Encounter    cyclobenzaprine (FLEXERIL) 10 mg Oral Tablet    nirmatrelvir-ritonavir 300 mg (150 mg x 2)-100 mg Oral Tablets, Dose Pack        PLAN: Treatment per orders . Call or return to clinic prn if these symptoms worsen or fail to improve as anticipated.  Meds reviewed as well as labs.  Gfr  61  last time    Paxlovid   bid  sent in  300 mg     Flexoril refill needed for  shoulder  issue    I personally offered the service to the patient, and obtained verbal consent to provide this service.    Time spent   33min  on telephone  visit    Delaney Meigs, PA-C

## 2022-01-16 NOTE — Nursing Note (Signed)
Pt is covid positive symptoms are stuffy nose sore throat sneezing fatigue headache achy no fever that she knows of  Symptoms started Saturday evening was tested at the er in [place] they would not send in paxlovid told she would  Have to contact her primary dr.

## 2022-01-23 ENCOUNTER — Other Ambulatory Visit: Payer: Self-pay

## 2022-01-29 ENCOUNTER — Other Ambulatory Visit: Payer: Self-pay

## 2022-03-01 ENCOUNTER — Ambulatory Visit (INDEPENDENT_AMBULATORY_CARE_PROVIDER_SITE_OTHER): Payer: Self-pay | Admitting: Internal Medicine

## 2022-03-08 ENCOUNTER — Other Ambulatory Visit: Payer: Self-pay

## 2022-03-08 MED FILL — buPROPion HCL XL 300 mg 24 hr tablet, extended release: ORAL | 90 days supply | Qty: 90 | Fill #1 | Status: AC

## 2022-03-09 ENCOUNTER — Other Ambulatory Visit: Payer: Self-pay

## 2022-03-14 ENCOUNTER — Other Ambulatory Visit: Payer: Self-pay

## 2022-03-14 ENCOUNTER — Encounter (INDEPENDENT_AMBULATORY_CARE_PROVIDER_SITE_OTHER): Payer: Self-pay | Admitting: Internal Medicine

## 2022-03-14 ENCOUNTER — Ambulatory Visit: Payer: No Typology Code available for payment source | Attending: Internal Medicine | Admitting: Internal Medicine

## 2022-03-14 VITALS — BP 140/82 | HR 83 | Temp 97.6°F | Ht 66.0 in | Wt 133.0 lb

## 2022-03-14 DIAGNOSIS — Z1239 Encounter for other screening for malignant neoplasm of breast: Secondary | ICD-10-CM

## 2022-03-14 DIAGNOSIS — K638219 Small intestinal bacterial overgrowth, unspecified: Secondary | ICD-10-CM | POA: Insufficient documentation

## 2022-03-14 DIAGNOSIS — E782 Mixed hyperlipidemia: Secondary | ICD-10-CM | POA: Insufficient documentation

## 2022-03-14 DIAGNOSIS — E538 Deficiency of other specified B group vitamins: Secondary | ICD-10-CM | POA: Insufficient documentation

## 2022-03-14 DIAGNOSIS — R5383 Other fatigue: Secondary | ICD-10-CM

## 2022-03-14 DIAGNOSIS — K219 Gastro-esophageal reflux disease without esophagitis: Secondary | ICD-10-CM

## 2022-03-14 MED ORDER — CYCLOBENZAPRINE 10 MG TABLET
10.0000 mg | ORAL_TABLET | Freq: Three times a day (TID) | ORAL | 1 refills | Status: AC
Start: 2022-03-14 — End: 2022-03-24
  Filled 2022-03-14: qty 15, 5d supply, fill #0

## 2022-03-14 MED ORDER — METRONIDAZOLE 500 MG TABLET
500.0000 mg | ORAL_TABLET | Freq: Three times a day (TID) | ORAL | 0 refills | Status: AC
Start: 2022-03-14 — End: 2022-04-14
  Filled 2022-03-14: qty 90, 30d supply, fill #0

## 2022-03-14 NOTE — Nursing Note (Signed)
Pt states that she needs to discuss medications with you she goes to see a new GI next week hers left  still having the pain in her shoulder that goes down her arm thought she may need a shot and more flexeril it was helping with the shoulder issue she is stretching again and also saw dr Albertine Grates and he stopped the plaquenil and she states she is having all the symptoms again with gi issue and arthritis pain and fatigue bloating belching and bowel changed and food craving is crazy she is on rifaxiamin

## 2022-03-14 NOTE — Progress Notes (Signed)
Name: Sonya Parks                       Date of Birth: 09-Jul-1955   MRN:  [id number]                         Date of visit: 03/14/2022     PCP: Delaney Meigs, PA-C     Subjective  Sonya Parks is a 67 y.o. year old female who presents for Follow Up (Pt states that she needs to discuss medications with you she goes to see a new GI next week hers left  still having the pain in her shoulder that goes down her arm thought she may need a shot and more flexeril it was helping with the shoulder issue she is stretching again and also saw dr Albertine Grates and he stopped the plaquenil and she states she is having all the symptoms again with gi issue and arthritis pain and fatigue bloating belching and bowel changed and food craving is crazy she is on rifaxiamin)   to clinic.  No specialty comments available.   Patient Active Problem List    Diagnosis Date Noted    Fatigue 10/09/2021    Small intestinal bacterial overgrowth (SIBO) 10/09/2021    B12 deficiency 10/09/2021    Mixed hyperlipidemia 09/21/2021    Esophageal reflux 09/21/2021    Vitamin D deficiency 09/21/2021    Degenerative arthritis 09/21/2021      Current Outpatient Medications   Medication Sig    buPROPion (WELLBUTRIN XL) 300 mg extended release 24 hr tablet TAKE 1 TABLET BY MOUTH EVERY MORNING    busPIRone (BUSPAR) 15 mg Oral Tablet Take 1 Tablet (15 mg total) by mouth Twice daily    celecoxib (CELEBREX) 200 mg Oral Capsule TAKE 1 CAPSULE BY MOUTH TWICE DAILY    ciclopirox (PENLAC) 8 % Solution Apply to affected nails daily; Wipe off every 7 days with alcohol and start daily process again    ciclopirox (PENLAC) 8 % Solution Apply to affected nails daily; Wipe off every 7 days with alcohol and start daily process again    dicyclomine (BENTYL) 10 mg Oral Capsule Take 1 Capsule (10 mg total) by mouth Four times a day    estradioL (VIVELLE-DOT) 0.0375 mg/24 hr Transdermal Patch  Semiweekly APPLY 1 PATCH TOPICALLY TO CLEAN, DRY, AND HAIR-FREE AREA OF THE LOWER STOMACH OR UPPER BUTTOCK AREA TWICE A WEEK (TUESDAY & FRIDAY)    itraconazole (SPORONAX) 100 mg Oral Capsule Take one tablet by mouth twice daily - Stop Erythromycin decrease Estrogen to 1/2 patch twice weekly Avoid PPIs like Omeprazole    lifitegrast (XIIDRA) 5 % Ophthalmic Dropperette Use 1 drop in both eyes twice daily    Lovastatin (MEVACOR) 10 mg Oral Tablet Take 1 Tablet (10 mg total) by mouth Once a day    metroNIDAZOLE (FLAGYL) 500 mg Oral Tablet Take 1 Tablet (500 mg total) by mouth Three times a day    naltrexone 4.5 mg Oral Capsule Take 1 Capsule (4.5 mg total) by mouth Once a day    pancreatic enzyme replacement (CREON) 12,000-38,000 -60,000 unit Oral Capsule, Delayed Release(E.C.) Take 2 capsules (24,000 units of lipase total) by mouth 3 times daily with meals.    pantoprazole (PROTONIX) 20 mg Oral Tablet, Delayed Release (E.C.) TAKE 1 TABLET BY MOUTH ONCE DAILY    progesterone micronized (PROMETRIUM) 100 mg Oral Capsule TAKE 1 CAPSULE BY MOUTH ONCE  DAILY AT BEDTIME ON DAYS 1-25 OF CALENDAR MONTH    progesterone micronized (PROMETRIUM) 100 mg Oral Capsule Take one capsule at bedtime days 1-25 of calendar month    rifAXIMin (XIFAXAN) 550 mg Oral Tablet Take 1 Tablet (550 mg total) by mouth Twice daily    thyroid (NP THYROID) 30 mg Oral Tablet Take 1 tablet in the morning 20-30 mins prior to breakfast          Chronic Disease Management-AMB    Follow-up visit  Follow-up visit with a 89year-old white female multiple medical problems    S/p muscle  pull in  right shoulder   but   resting again due to pain down   right arm         Fu IGA Deficiency         Seeing  Dr Santo Held is low      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  (pt having trouble getting the medication due to insurance)  Pt still has  flactualnce  and bloating        Fu Low  thyroid  Pt on np thyroid and  doing well    Fu Candidiasis    S/p itraconazole  x   3 mo for candidiasis  in gut       Dr Rogers Blocker  Rheumatology  fu [facility]       Dr Sallyanne Havers  is Ortho   [facility]  s/p hip replacement      Fu  abnormal serum electrophoresis   seen by Dr Genene Churn  and she was treated with Infusion of gamma globulins in past   Now following with new Hematologist     Eye exam  q 36mo and sees  Dr  Toribio Harbour       Follow up Hyponatremia     Pt's labs have been better  and she has been feeling better    Follow-up SIBO    Followed at  [facility]  [facility]   YEAST  + TREATED   AND  DIFLUCAN 100 MG  X  2 WEEKS DAILY   QOD  NOW  X  7 MORE  DOSES  AND IT HELPED   11/27/19   Dr Ayesha Rumpf  at [facility]  practioner     July   16th   than  took  xifaxin    550 tid  july   17th    2 weeks  gained on  xifaximin  550  tid    sept  15th   neomycin  and    2 weeks   due to  levels   and  gas bloating  and  symptoms     breath test  on  11/04/19   still high but lower  Update  on SIBO  pt was  started  again  with new provider   at [facility]   11/20/18  and had   bacterial overgrowth test  and  now  on   flagyl 250 tid  and neomycin  as well pt has  been  better but still getting  bloating     Pt now  following  with  Dr Karolee Ohs   [facility]  Pt has been  off diet some but now on  no  carbs  no sugars  etc      Hx of SIBO  patient was at this time has beat this and is doing very well she is on specific diet after  120 days of treatment and followed with Dr. Heide Guile at [facility] patient was following with him regarding her gas bloating abdominal pain patient's had a very long course of Flagyl and neomycin her recent HPT test showed resolution of SIBO her symptoms did improve but she still states that she has significant fatigue she feels much better when she is on antibiotic today we discussed acidophilus.      Currently   Pt going to see new  GI doctor  next  week  for  SIBO and  EPI      Medication  helping  right now  but    S/p Rifagut and neomycin   3 weeks and then  Rifaxim with Flagyl  and alternate   To start now     Pt  says  bloating   less  less belching   and  is having  symptoms of dieing off   Fatigue still present            Follow-up depression anxiety patient was on Cymbalta 60 mg we did increase to 90 mg to see if that would help with the joint pain as well and the She felt tired  so  it was tapered of f Currently  she is on Wellbutrin  and  Buspar       Follow-up GERD stable needs back on  protonix and pepcid ( was on  40 mg  down  on  20 mg  )      as  has had  gi  issues   s/p  egd   she wants  to come off  meds  if can   She will need  to  ask  GI       Follow-up AR no new symptoms  but  has been dry      Follow-up vitamin D deficiency on D supplement    Fu Mild   Hyperlipidemia   she  wanted to discuss lovastatin  re chol   and her SIBO    pt  has  family  hx of  heart disease        Follow up  fatigue  still  tired at most times       EPI  discussed and   can do Creon  at her  wt   1 with meals and snacks              REVIEW OF SYSTEMS:   Review of Systems  General: No fever.  No chills.  No weight changes.  HEENT: No vision changes.  Cardiac: No chest pain. No palpitations.  No dizziness.  No light-headedness.  No near syncope.  Resp: No dyspnea at rest, no dyspnea on exertion; no cough or hemoptysis; no orthopnea or PND.  GI: No N/V. No melena.  No bright red blood per rectum.  Ext: No edema.  No claudication.  Neuro: No focal weakness.  No numbness.  All other ROS negative.      Objective:   BP (!) 140/82 (Site: Left, Patient Position: Sitting)   Pulse 83   Temp 36.4 C (97.6 F)   Ht 1.676 m (5\' 6" )   Wt 60.3 kg (133 lb)   SpO2 98%   BMI 21.47  kg/m              PHYSICAL EXAM  Physical Exam  Gen: NAD. Alert.   HEENT: PERRL; conjunctivae clear. No JVD or carotid bruit.  Cardiac: RRR with normal S1, S2.   Lungs: Clear to auscultation bilaterally. No rales. No wheezing. No rhonchi.  Abdomen: Soft,  non-tender.non-distended  nl bowel sounds    Extremities: No edema. No cyanosis. No clubbing.  Neurologic:  Grossly intact  Shoulder  ROM minimally limited  soreness with abduction       Assessment/Plan  Problem List Items Addressed This Visit          Cardiovascular System    Mixed hyperlipidemia - Primary    Relevant Orders    CBC/DIFF    COMPREHENSIVE METABOLIC PNL, FASTING    THYROID STIMULATING HORMONE (SENSITIVE TSH)    LIPID PANEL       Digestive    Esophageal reflux    Relevant Orders    CBC/DIFF    COMPREHENSIVE METABOLIC PNL, FASTING    THYROID STIMULATING HORMONE (SENSITIVE TSH)    LIPID PANEL    Small intestinal bacterial overgrowth (SIBO)    Relevant Orders    CBC/DIFF    COMPREHENSIVE METABOLIC PNL, FASTING    THYROID STIMULATING HORMONE (SENSITIVE TSH)    LIPID PANEL       Endocrine    B12 deficiency    Relevant Orders    CBC/DIFF    COMPREHENSIVE METABOLIC PNL, FASTING    THYROID STIMULATING HORMONE (SENSITIVE TSH)    LIPID PANEL       Other    Fatigue    Relevant Orders    CBC/DIFF    COMPREHENSIVE METABOLIC PNL, FASTING    THYROID STIMULATING HORMONE (SENSITIVE TSH)    LIPID PANEL     Other Visit Diagnoses       Encounter for screening for malignant neoplasm of breast, unspecified screening modality        Relevant Orders    MAMMO BILATERAL SCREENING             Orders Placed This Encounter    MAMMO BILATERAL SCREENING    CBC/DIFF    COMPREHENSIVE METABOLIC PNL, FASTING    THYROID STIMULATING HORMONE (SENSITIVE TSH)    LIPID PANEL    metroNIDAZOLE (FLAGYL) 500 mg Oral Tablet    cyclobenzaprine (FLEXERIL) 10 mg Oral Tablet        Meds reviewed as well as labs.  Chart reviewed and updated.   Continue current treatment.  Keep follow-up appointment.   Vaccine hx reviewed.   Pt  is still working    Flagyl  ordered   Flexeril  prn  shoulder spasm    Mammogram  coming due    Labs needed in fu     Pt to fu with GI  at  [facility]

## 2022-03-15 ENCOUNTER — Other Ambulatory Visit: Payer: Self-pay

## 2022-03-16 ENCOUNTER — Telehealth (INDEPENDENT_AMBULATORY_CARE_PROVIDER_SITE_OTHER): Payer: Self-pay | Admitting: Internal Medicine

## 2022-03-20 ENCOUNTER — Other Ambulatory Visit: Payer: Self-pay

## 2022-03-21 ENCOUNTER — Ambulatory Visit (HOSPITAL_COMMUNITY): Payer: Self-pay

## 2022-04-01 ENCOUNTER — Other Ambulatory Visit: Payer: Self-pay

## 2022-04-01 MED FILL — lovastatin 10 mg tablet: ORAL | 90 days supply | Qty: 90 | Fill #0 | Status: AC

## 2022-04-03 ENCOUNTER — Other Ambulatory Visit: Payer: Self-pay

## 2022-04-21 ENCOUNTER — Other Ambulatory Visit: Payer: Self-pay

## 2022-04-24 ENCOUNTER — Other Ambulatory Visit: Payer: Self-pay

## 2022-04-25 ENCOUNTER — Other Ambulatory Visit (INDEPENDENT_AMBULATORY_CARE_PROVIDER_SITE_OTHER): Payer: Self-pay | Admitting: Internal Medicine

## 2022-04-25 ENCOUNTER — Other Ambulatory Visit: Payer: Self-pay

## 2022-04-25 DIAGNOSIS — R748 Abnormal levels of other serum enzymes: Secondary | ICD-10-CM

## 2022-04-25 DIAGNOSIS — Z780 Asymptomatic menopausal state: Secondary | ICD-10-CM

## 2022-04-25 DIAGNOSIS — Z13820 Encounter for screening for osteoporosis: Secondary | ICD-10-CM

## 2022-05-04 ENCOUNTER — Other Ambulatory Visit: Payer: Self-pay

## 2022-05-04 MED FILL — celecoxib 200 mg capsule: ORAL | 90 days supply | Qty: 180 | Fill #2 | Status: AC

## 2022-05-05 ENCOUNTER — Other Ambulatory Visit: Payer: Self-pay

## 2022-05-15 ENCOUNTER — Other Ambulatory Visit: Payer: Self-pay

## 2022-05-15 ENCOUNTER — Encounter (INDEPENDENT_AMBULATORY_CARE_PROVIDER_SITE_OTHER): Payer: Self-pay | Admitting: Internal Medicine

## 2022-05-15 ENCOUNTER — Ambulatory Visit: Payer: No Typology Code available for payment source | Attending: Internal Medicine | Admitting: Internal Medicine

## 2022-05-15 VITALS — BP 140/82 | HR 98 | Temp 97.0°F | Ht 66.0 in | Wt 133.0 lb

## 2022-05-15 DIAGNOSIS — M25559 Pain in unspecified hip: Secondary | ICD-10-CM | POA: Insufficient documentation

## 2022-05-15 DIAGNOSIS — M549 Dorsalgia, unspecified: Secondary | ICD-10-CM | POA: Insufficient documentation

## 2022-05-15 DIAGNOSIS — Z0000 Encounter for general adult medical examination without abnormal findings: Secondary | ICD-10-CM | POA: Insufficient documentation

## 2022-05-15 MED ORDER — PANTOPRAZOLE 20 MG TABLET,DELAYED RELEASE
DELAYED_RELEASE_TABLET | ORAL | 3 refills | Status: DC
Start: 2022-05-15 — End: 2022-12-05
  Filled 2022-05-15: qty 90, 90d supply, fill #0
  Filled 2022-09-12: qty 90, 90d supply, fill #1

## 2022-05-15 MED ORDER — SHINGRIX (PF) 50 MCG/0.5 ML INTRAMUSCULAR SUSPENSION, KIT
0.5000 mL | INHALATION_SUSPENSION | Freq: Once | INTRAMUSCULAR | 1 refills | Status: AC
Start: 2022-05-15 — End: 2022-05-15

## 2022-05-15 NOTE — Progress Notes (Signed)
Name: ARREN DUNAVAN                       Date of Birth: [birth year]   MRN:  [id number]                         Date of visit: 05/15/2022     PCP: Samuella Cota, PA-C     Subjective  Sonya Parks is a 67 y.o. year old female who presents for Annual Wellness Exam (Annual wellness exam  she is scheduled for mammo and bone density this month/Does not want pap now)   to clinic.  No specialty comments available.   Patient Active Problem List    Diagnosis Date Noted    Fatigue 10/09/2021    Small intestinal bacterial overgrowth (SIBO) 10/09/2021    B12 deficiency 10/09/2021    Mixed hyperlipidemia 09/21/2021    Esophageal reflux 09/21/2021    Vitamin D deficiency 09/21/2021    Degenerative arthritis 09/21/2021      Current Outpatient Medications   Medication Sig    buPROPion (WELLBUTRIN XL) 300 mg extended release 24 hr tablet TAKE 1 TABLET BY MOUTH EVERY MORNING    busPIRone (BUSPAR) 15 mg Oral Tablet Take 1 Tablet (15 mg total) by mouth Twice daily    celecoxib (CELEBREX) 200 mg Oral Capsule TAKE 1 CAPSULE BY MOUTH TWICE DAILY    ciclopirox (PENLAC) 8 % Solution Apply to affected nails daily; Wipe off every 7 days with alcohol and start daily process again    ciclopirox (PENLAC) 8 % Solution Apply to affected nails daily; Wipe off every 7 days with alcohol and start daily process again    dicyclomine (BENTYL) 10 mg Oral Capsule Take 1 Capsule (10 mg total) by mouth Four times a day    estradioL (VIVELLE-DOT) 0.0375 mg/24 hr Transdermal Patch Semiweekly APPLY 1 PATCH TOPICALLY TO CLEAN, DRY, AND HAIR-FREE AREA OF THE LOWER STOMACH OR UPPER BUTTOCK AREA TWICE A WEEK (TUESDAY & FRIDAY)    itraconazole (SPORONAX) 100 mg Oral Capsule Take one tablet by mouth twice daily - Stop Erythromycin decrease Estrogen to 1/2 patch twice weekly Avoid PPIs like Omeprazole    lifitegrast (XIIDRA) 5 % Ophthalmic Dropperette Use 1 drop in both eyes twice daily    Lovastatin (MEVACOR) 10 mg Oral Tablet Take 1 Tablet (10 mg total) by  mouth Once a day (Patient not taking: Reported on 05/15/2022)    naltrexone 4.5 mg Oral Capsule Take 1 Capsule (4.5 mg total) by mouth Once a day    pancreatic enzyme replacement (CREON) 12,000-38,000 -60,000 unit Oral Capsule, Delayed Release(E.C.) Take 2 capsules (24,000 units of lipase total) by mouth 3 times daily with meals.    pantoprazole (PROTONIX) 20 mg Oral Tablet, Delayed Release (E.C.) TAKE 1 TABLET BY MOUTH ONCE DAILY    progesterone micronized (PROMETRIUM) 100 mg Oral Capsule TAKE 1 CAPSULE BY MOUTH ONCE DAILY AT BEDTIME ON DAYS 1-25 OF CALENDAR MONTH    progesterone micronized (PROMETRIUM) 100 mg Oral Capsule Take one capsule at bedtime days 1-25 of calendar month    rifAXIMin (XIFAXAN) 550 mg Oral Tablet Take 1 Tablet (550 mg total) by mouth Twice daily            HPI  This  67 yo is here for her annual exam  Pt is  still working and overall feeling well     REVIEW OF SYSTEMS:   Review of  Systems  Review of Systems have been reviewed  ROS  Const  Reports system reviewed and no additional complaints, except as documented  ENT  Reports system reviewed and no additional complaints, except as documented  Resp  Reports system reviewed and no additional complaints, except as documented  GI  Reports system reviewed and no additional complaints, except as documented   Objective:   BP (!) 142/90 (Site: Left, Patient Position: Sitting)   Pulse 98   Temp 36.1 C (97 F)   Ht 1.676 m (5\' 6" )   Wt 60.3 kg (133 lb)   SpO2 99%   BMI 21.47 kg/m              PHYSICAL EXAM  Physical Exam  Gen: NAD. Alert.   Exam-AMB  Const  eac  tm  clear    General: cooperative, healthy appearing and no acute distress  Orientation/Consciousness: patient oriented x3  HENMT  Ears: hearing grossly normal bilaterally  Eyes  General: appearance normal, both eyes and all related structures  Conjunctivae: conjunctivae normal  Sclera: sclerae normal  EOM: EOM intact bilaterally  Neck  Neck: normal visual inspection and no  lymphadenopathy  Thyroid: thyroid normal  Carotids: no bruits  Resp  Effort & Inspection: normal respiratory effort  Auscultation: clear to auscultation bilaterally, no crackles, no rales, no rhonchi and no wheezes  Cardio  Jugular venous pressure: no JVD  Rate: regular rate  Rhythm: regular rhythm  Heart Sounds: S1 normal, S2 normal, no click, no murmurs and no rubs  Bruits: no carotid bruits  GI  Inspection: Yes normal to inspection  Palpation: soft, no hepatosplenomegaly, no guarding, no masses and nontender  Auscultation: normal bowel sounds  Neuro  General: patient oriented x3  Extrem  General: normal to inspection, normal exam except as noted, clubbing, cyanosis or edema noted, normal gait and other  Psych  Mental Status: mental status grossly normal  Mood: congruent mood  Affect: normal affect  Insight: insight good  Judgment: judgment good       Assessment/Plan  Assessment/Plan   1. Encounter for annual health examination    Mammogram and  dexa coming up soon  Chiropractic for wellness care  Dr Elvina Sidle  WF  appt  coming  up   Dr Elvina Sidle  for  eye appt  coming up soon      Meds reviewed as well as labs.  Chart reviewed and updated.   Continue current treatment.  Keep follow-up appointment.   Vaccine hx reviewed.

## 2022-05-15 NOTE — Patient Instructions (Signed)
Ahmed last  note

## 2022-05-15 NOTE — Nursing Note (Signed)
Annual wellness exam  she is scheduled for mammo and bone density this month  Does not want pap now

## 2022-05-16 ENCOUNTER — Other Ambulatory Visit: Payer: Self-pay

## 2022-05-24 ENCOUNTER — Other Ambulatory Visit (INDEPENDENT_AMBULATORY_CARE_PROVIDER_SITE_OTHER): Payer: Self-pay | Admitting: Internal Medicine

## 2022-05-24 ENCOUNTER — Ambulatory Visit (HOSPITAL_COMMUNITY): Payer: Self-pay

## 2022-05-24 DIAGNOSIS — E782 Mixed hyperlipidemia: Secondary | ICD-10-CM

## 2022-05-24 DIAGNOSIS — E875 Hyperkalemia: Secondary | ICD-10-CM

## 2022-05-24 DIAGNOSIS — M3500 Sicca syndrome, unspecified: Secondary | ICD-10-CM

## 2022-05-29 ENCOUNTER — Encounter (HOSPITAL_COMMUNITY): Payer: Self-pay

## 2022-05-29 ENCOUNTER — Other Ambulatory Visit: Payer: Self-pay

## 2022-05-29 ENCOUNTER — Inpatient Hospital Stay
Admission: RE | Admit: 2022-05-29 | Discharge: 2022-05-29 | Disposition: A | Discharge status: Home / Self Care | Payer: No Typology Code available for payment source | Source: Ambulatory Visit | Attending: Internal Medicine

## 2022-05-29 ENCOUNTER — Inpatient Hospital Stay (HOSPITAL_COMMUNITY)
Admission: RE | Admit: 2022-05-29 | Discharge: 2022-05-29 | Disposition: A | Discharge status: Home / Self Care | Payer: No Typology Code available for payment source | Source: Ambulatory Visit | Attending: Internal Medicine

## 2022-05-29 DIAGNOSIS — Z780 Asymptomatic menopausal state: Secondary | ICD-10-CM | POA: Insufficient documentation

## 2022-05-29 DIAGNOSIS — Z13820 Encounter for screening for osteoporosis: Secondary | ICD-10-CM | POA: Insufficient documentation

## 2022-05-29 DIAGNOSIS — Z1239 Encounter for other screening for malignant neoplasm of breast: Secondary | ICD-10-CM

## 2022-05-29 DIAGNOSIS — Z1231 Encounter for screening mammogram for malignant neoplasm of breast: Secondary | ICD-10-CM

## 2022-05-30 ENCOUNTER — Other Ambulatory Visit (INDEPENDENT_AMBULATORY_CARE_PROVIDER_SITE_OTHER): Payer: Self-pay

## 2022-05-30 DIAGNOSIS — Z0000 Encounter for general adult medical examination without abnormal findings: Secondary | ICD-10-CM

## 2022-06-01 ENCOUNTER — Telehealth (INDEPENDENT_AMBULATORY_CARE_PROVIDER_SITE_OTHER): Payer: Self-pay | Admitting: Internal Medicine

## 2022-06-01 ENCOUNTER — Other Ambulatory Visit (INDEPENDENT_AMBULATORY_CARE_PROVIDER_SITE_OTHER): Payer: Self-pay | Admitting: Internal Medicine

## 2022-06-01 ENCOUNTER — Other Ambulatory Visit (INDEPENDENT_AMBULATORY_CARE_PROVIDER_SITE_OTHER): Payer: Self-pay

## 2022-06-01 DIAGNOSIS — R209 Unspecified disturbances of skin sensation: Secondary | ICD-10-CM

## 2022-06-01 DIAGNOSIS — M3500 Sicca syndrome, unspecified: Secondary | ICD-10-CM

## 2022-06-01 DIAGNOSIS — E875 Hyperkalemia: Secondary | ICD-10-CM

## 2022-06-06 ENCOUNTER — Other Ambulatory Visit: Payer: Self-pay

## 2022-06-06 MED ORDER — ATORVASTATIN 10 MG TABLET
10.0000 mg | ORAL_TABLET | Freq: Every day | ORAL | 4 refills | Status: DC
Start: 2022-06-06 — End: 2023-11-21
  Filled 2022-06-06: qty 90, 90d supply, fill #0
  Filled 2022-09-12: qty 90, 90d supply, fill #1
  Filled 2022-12-05: qty 90, 90d supply, fill #2
  Filled 2023-03-09: qty 90, 90d supply, fill #3
  Filled 2023-06-02: qty 90, 90d supply, fill #4

## 2022-06-06 MED FILL — buPROPion HCL XL 300 mg 24 hr tablet, extended release: ORAL | 90 days supply | Qty: 90 | Fill #2 | Status: AC

## 2022-06-07 ENCOUNTER — Other Ambulatory Visit: Payer: Self-pay

## 2022-06-08 ENCOUNTER — Telehealth (INDEPENDENT_AMBULATORY_CARE_PROVIDER_SITE_OTHER): Payer: Self-pay | Admitting: Internal Medicine

## 2022-06-08 ENCOUNTER — Other Ambulatory Visit: Payer: Self-pay

## 2022-06-08 MED ORDER — CREON 24,000-76,000-120,000 UNIT CAPSULE,DELAYED RELEASE
DELAYED_RELEASE_CAPSULE | ORAL | 3 refills | Status: DC
Start: 2022-06-08 — End: 2023-01-19
  Filled 2022-06-08: qty 90, 30d supply, fill #0
  Filled 2022-07-18: qty 90, 30d supply, fill #1
  Filled 2022-08-16: qty 90, 30d supply, fill #2
  Filled 2022-10-16: qty 100, 33d supply, fill #3
  Filled 2022-12-05: qty 100, 33d supply, fill #4
  Filled 2023-01-05 – 2023-01-18 (×2): qty 100, 33d supply, fill #5

## 2022-06-12 ENCOUNTER — Other Ambulatory Visit: Payer: Self-pay

## 2022-07-10 ENCOUNTER — Other Ambulatory Visit: Payer: Self-pay

## 2022-07-11 ENCOUNTER — Other Ambulatory Visit: Payer: Self-pay

## 2022-07-18 ENCOUNTER — Other Ambulatory Visit (INDEPENDENT_AMBULATORY_CARE_PROVIDER_SITE_OTHER): Payer: Self-pay | Admitting: Internal Medicine

## 2022-07-18 ENCOUNTER — Other Ambulatory Visit: Payer: Self-pay

## 2022-07-18 MED ORDER — CELECOXIB 200 MG CAPSULE
ORAL_CAPSULE | ORAL | 2 refills | Status: DC
Start: 2022-07-18 — End: 2023-05-17
  Filled 2022-07-18: qty 180, 90d supply, fill #0
  Filled 2022-11-01: qty 180, 90d supply, fill #1
  Filled 2023-02-09: qty 180, 90d supply, fill #2

## 2022-07-18 MED ORDER — ESTRADIOL 0.0375 MG/24 HR SEMIWEEKLY TRANSDERMAL PATCH
MEDICATED_PATCH | TRANSDERMAL | 4 refills | Status: DC
Start: 2022-07-18 — End: 2022-12-05
  Filled 2022-07-18: qty 24, 84d supply, fill #0
  Filled 2022-10-16: qty 16, 56d supply, fill #1

## 2022-07-18 MED ORDER — PROGESTERONE MICRONIZED 100 MG CAPSULE
ORAL_CAPSULE | ORAL | 3 refills | Status: DC
Start: 2022-07-18 — End: 2022-12-20
  Filled 2022-07-18: qty 75, 90d supply, fill #0
  Filled 2022-10-16: qty 75, 90d supply, fill #1

## 2022-07-19 ENCOUNTER — Other Ambulatory Visit: Payer: Self-pay

## 2022-07-31 ENCOUNTER — Emergency Department
Admission: EM | Admit: 2022-07-31 | Discharge: 2022-07-31 | Disposition: A | Discharge status: Home / Self Care | Payer: No Typology Code available for payment source | Attending: Family | Admitting: Family

## 2022-07-31 ENCOUNTER — Encounter (HOSPITAL_BASED_OUTPATIENT_CLINIC_OR_DEPARTMENT_OTHER): Payer: Self-pay

## 2022-07-31 ENCOUNTER — Other Ambulatory Visit: Payer: Self-pay

## 2022-07-31 DIAGNOSIS — N390 Urinary tract infection, site not specified: Secondary | ICD-10-CM

## 2022-07-31 DIAGNOSIS — R197 Diarrhea, unspecified: Secondary | ICD-10-CM | POA: Insufficient documentation

## 2022-07-31 DIAGNOSIS — N3090 Cystitis, unspecified without hematuria: Secondary | ICD-10-CM | POA: Insufficient documentation

## 2022-07-31 LAB — URINALYSIS, MICROSCOPIC

## 2022-07-31 LAB — URINALYSIS, MACRO/MICRO
BILIRUBIN: NEGATIVE mg/dL
GLUCOSE: NEGATIVE mg/dL
NITRITE: POSITIVE — AB
PH: 7.5 (ref 4.6–8.0)
PROTEIN: 100 mg/dL — AB
SPECIFIC GRAVITY: 1.02 (ref 1.003–1.035)
UROBILINOGEN: 0.2 mg/dL (ref 0.2–1.0)

## 2022-07-31 MED ORDER — CEFTRIAXONE 1 GRAM SOLUTION FOR INJECTION
INTRAMUSCULAR | Status: AC
Start: 2022-07-31 — End: 2022-07-31
  Filled 2022-07-31: qty 10

## 2022-07-31 MED ORDER — KETOROLAC 30 MG/ML (1 ML) INJECTION SOLUTION
INTRAMUSCULAR | Status: AC
Start: 2022-07-31 — End: 2022-07-31
  Filled 2022-07-31: qty 1

## 2022-07-31 MED ORDER — PHENAZOPYRIDINE 200 MG TABLET
200.0000 mg | ORAL_TABLET | Freq: Three times a day (TID) | ORAL | 0 refills | Status: AC
Start: 2022-07-31 — End: 2022-08-03

## 2022-07-31 MED ORDER — LIDOCAINE HCL 10 MG/ML (1 %) INJECTION SOLUTION
1.0000 g | Freq: Once | INTRAMUSCULAR | Status: AC
Start: 2022-07-31 — End: 2022-07-31
  Administered 2022-07-31: 1 g via INTRAMUSCULAR

## 2022-07-31 MED ORDER — KETOROLAC 30 MG/ML (1 ML) INJECTION SOLUTION
30.0000 mg | INTRAMUSCULAR | Status: AC
Start: 2022-07-31 — End: 2022-07-31
  Administered 2022-07-31: 30 mg via INTRAMUSCULAR

## 2022-07-31 MED ORDER — NITROFURANTOIN MONOHYDRATE/MACROCRYSTALS 100 MG CAPSULE
100.0000 mg | ORAL_CAPSULE | Freq: Two times a day (BID) | ORAL | 0 refills | Status: AC
Start: 2022-07-31 — End: 2022-08-07

## 2022-07-31 NOTE — ED Triage Notes (Signed)
Patient reports urinary frequency, pink tinged urine, and some lower abdominal pain x2 days.

## 2022-07-31 NOTE — ED Nurses Note (Signed)

## 2022-07-31 NOTE — Discharge Instructions (Addendum)
Return if not  better ,worse or any concern.

## 2022-07-31 NOTE — ED Provider Notes (Signed)
[facility], [facility] Emergency Department  ED Primary Provider Note  History of Present Illness   Chief Complaint   Patient presents with    Urinary Frequency     Arrival: The patient arrived by Car  Sonya Parks is a 67 y.o. female who had concerns including Urinary Frequency. Pt states she has had sudden onset of dysuria. Freq. Burning with urination. States no fever no hx of stones. Just had bowels evaluated for constipation and now has diarrhea. States not uncommon to go back and forth. States has had a good appetite today.   Review of Systems   Constitutional: No fever, chills or weakness   Skin: No rash or diaphoresis  HENT: No headaches, or congestion  Eyes: No vision changes or photophobia   Cardio: No chest pain, palpitations or leg swelling   Respiratory: No cough, wheezing or SOB  GI:  No nausea, vomiting or stool changes  GU:  +dysuria, hematuria poss pink in urine , increased frequency urgency.   MSK: No muscle aches, joint or back pain  Neuro: No seizures, LOC, numbness, tingling, or focal weakness  Psychiatric: No depression, SI or substance abuse  All other systems reviewed and are negative.    History Reviewed This Encounter: all noted and reviewed    Physical Exam   ED Triage Vitals [07/31/22 1825]   BP (Non-Invasive) (!) 156/100   Heart Rate 61   Respiratory Rate 19   Temperature 36 C (96.8 F)   SpO2 97 %   Weight    Height      Constitutional:  67 y.o. female who appears in no distress. Normal color, no cyanosis.   HENT:   Head: Normocephalic and atraumatic.   Mouth/Throat: Oropharynx is clear and moist.   Eyes: EOMI, PERRL   Neck: Trachea midline. Neck supple.  Cardiovascular: RRR, No murmurs, rubs or gallops. Intact distal pulses.  Pulmonary/Chest: BS equal bilaterally. No respiratory distress. No wheezes, rales or chest tenderness.   Abdominal: Bowel sounds present and normal. Abdomen soft, no tenderness, no rebound and no guarding.  Back: No midline  spinal tenderness, no paraspinal tenderness, no CVA tenderness.           Musculoskeletal: No edema, tenderness or deformity.  Skin: warm and dry. No rash, erythema, pallor or cyanosis  Psychiatric: normal mood and affect. Behavior is normal.   Neurological: Patient keenly alert and responsive, easily able to raise eyebrows, facial muscles/expressions symmetric, speaking in fluent sentences, moving all extremities equally and fully, normal gait  Patient Data     Labs Ordered/Reviewed   URINALYSIS, MACRO/MICRO - Abnormal; Notable for the following components:       Result Value    LEUKOCYTES Large (*)     NITRITE Positive (*)     PROTEIN 100 (*)     KETONES Trace (*)     BLOOD Large (*)     All other components within normal limits   URINE CULTURE,ROUTINE   URINALYSIS WITH REFLEX MICROSCOPIC AND CULTURE IF POSITIVE    Narrative:     The following orders were created for panel order URINALYSIS WITH REFLEX MICROSCOPIC AND CULTURE IF POSITIVE.  Procedure                               Abnormality         Status                     ---------                               -----------         ------  URINALYSIS, MACRO/MICRO[[id number]]      Abnormal            Final result               URINALYSIS, MICROSCOPIC[[id number]]                          In process                   Please view results for these tests on the individual orders.   URINALYSIS, MICROSCOPIC     No orders to display     Medical Decision Making   Diff dx of UTI, stone. Pyelo cystitis.    Large leuk, pos nitrate. Large blood . Cap refill  2 sec non toxic.       Medications Administered in the ED   cefTRIAXone (ROCEPHIN) 1 g in lidocaine 2.86 mL (tot vol) IM injection (has no administration in time range)   ketorolac (TORADOL) 30 mg/mL injection (has no administration in time range)     Clinical Impression   UTI (urinary tract infection) (Primary)       Disposition: Discharged

## 2022-08-01 ENCOUNTER — Telehealth (HOSPITAL_COMMUNITY): Payer: Self-pay | Admitting: Internal Medicine

## 2022-08-01 NOTE — Telephone Encounter (Signed)
Post Ed Follow-Up    Post ED Follow-Up:   Document completed and/or attempted interactive contact(s) after transition to home after emergency department stay.:   Transition Facility and relevant Date:   Discharge Date: 07/31/22  Discharge from [facility] Emergency Department?: Yes  Discharge Facility: [facility] (Comment: [facility])  Contacted by: Glorianne Manchester, RN  Contact method: Patient/Caregiver Telephone  Contact completed: 08/01/2022  4:09 PM  MyChart message sent?: No  Was the AVS reviewed with patient?: Yes  How is the patient recovering?: Improving  Medications prescribed: Yes  Were they obtained?: Yes  Interventions: No needs identified

## 2022-08-02 ENCOUNTER — Other Ambulatory Visit (INDEPENDENT_AMBULATORY_CARE_PROVIDER_SITE_OTHER): Payer: Self-pay | Admitting: Internal Medicine

## 2022-08-02 DIAGNOSIS — R140 Abdominal distension (gaseous): Secondary | ICD-10-CM

## 2022-08-02 DIAGNOSIS — K582 Mixed irritable bowel syndrome: Secondary | ICD-10-CM

## 2022-08-02 MED ORDER — RIFAXIMIN 550 MG TABLET
550.0000 mg | ORAL_TABLET | Freq: Two times a day (BID) | ORAL | 1 refills | Status: DC
Start: 2022-08-02 — End: 2022-12-18

## 2022-08-03 LAB — URINE CULTURE,ROUTINE: URINE CULTURE: >100000 — AB

## 2022-08-07 ENCOUNTER — Other Ambulatory Visit: Payer: Self-pay

## 2022-08-10 ENCOUNTER — Other Ambulatory Visit: Payer: Self-pay

## 2022-08-15 ENCOUNTER — Other Ambulatory Visit: Payer: Self-pay

## 2022-08-15 MED ORDER — NEOMYCIN 500 MG TABLET
ORAL_TABLET | ORAL | 0 refills | Status: DC
Start: 2022-08-15 — End: 2022-12-18
  Filled 2022-08-15: qty 126, 42d supply, fill #0

## 2022-08-16 ENCOUNTER — Other Ambulatory Visit: Payer: Self-pay

## 2022-08-16 ENCOUNTER — Ambulatory Visit (INDEPENDENT_AMBULATORY_CARE_PROVIDER_SITE_OTHER): Payer: No Typology Code available for payment source | Admitting: Physician Assistant

## 2022-08-16 ENCOUNTER — Other Ambulatory Visit (INDEPENDENT_AMBULATORY_CARE_PROVIDER_SITE_OTHER): Payer: Self-pay | Admitting: Internal Medicine

## 2022-08-16 MED ORDER — XIFAXAN 550 MG TABLET
ORAL_TABLET | ORAL | 0 refills | Status: DC
Start: 2022-08-15 — End: 2022-12-18
  Filled 2022-08-16 – 2022-08-24 (×4): qty 270, 90d supply, fill #0
  Filled 2022-09-18: qty 270, fill #0
  Filled 2022-09-22 – 2022-09-25 (×2): qty 270, 90d supply, fill #0

## 2022-08-16 MED ORDER — BUPROPION HCL XL 300 MG 24 HR TABLET, EXTENDED RELEASE
ORAL_TABLET | ORAL | 2 refills | Status: DC
Start: 2022-08-16 — End: 2023-06-02
  Filled 2022-08-16: qty 90, fill #0
  Filled 2022-09-12: qty 90, 90d supply, fill #0
  Filled 2022-12-05: qty 90, 90d supply, fill #1
  Filled 2023-03-09: qty 90, 90d supply, fill #2

## 2022-08-17 ENCOUNTER — Other Ambulatory Visit: Payer: Self-pay

## 2022-08-22 ENCOUNTER — Other Ambulatory Visit: Payer: Self-pay

## 2022-08-23 ENCOUNTER — Ambulatory Visit (INDEPENDENT_AMBULATORY_CARE_PROVIDER_SITE_OTHER): Payer: No Typology Code available for payment source | Admitting: Physician Assistant

## 2022-08-23 ENCOUNTER — Other Ambulatory Visit: Payer: Self-pay

## 2022-08-23 VITALS — BP 150/98 | HR 103 | Ht 66.5 in | Wt 130.6 lb

## 2022-08-23 DIAGNOSIS — N393 Stress incontinence (female) (male): Secondary | ICD-10-CM

## 2022-08-23 DIAGNOSIS — Z87440 Personal history of urinary (tract) infections: Secondary | ICD-10-CM

## 2022-08-23 DIAGNOSIS — M545 Low back pain, unspecified: Secondary | ICD-10-CM

## 2022-08-23 DIAGNOSIS — R109 Unspecified abdominal pain: Secondary | ICD-10-CM

## 2022-08-23 DIAGNOSIS — R319 Hematuria, unspecified: Secondary | ICD-10-CM

## 2022-08-23 DIAGNOSIS — N3941 Urge incontinence: Secondary | ICD-10-CM

## 2022-08-23 DIAGNOSIS — N3281 Overactive bladder: Secondary | ICD-10-CM

## 2022-08-23 DIAGNOSIS — N3946 Mixed incontinence: Secondary | ICD-10-CM

## 2022-08-23 MED ORDER — ESTRADIOL 0.01% (0.1 MG/GRAM) VAGINAL CREAM
TOPICAL_CREAM | VAGINAL | 11 refills | Status: DC
  Filled 2022-08-23: qty 42.5, 90d supply, fill #0
  Filled 2022-12-05: qty 42.5, 90d supply, fill #1
  Filled 2023-03-09: qty 42.5, 90d supply, fill #2

## 2022-08-23 NOTE — Progress Notes (Signed)
PA Status  Pending   Medication Xifaxan 550mg    Provider [facility]   Method    Key/Ref# Fax [phone]    N/A

## 2022-08-23 NOTE — Progress Notes (Signed)
UROLOGY, [facility]  [address] [place] [address]    Progress Note    Name: Sonya Parks MRN:  [id number]   Date: 08/23/2022 DOB:  Feb 02, 1955 (67 y.o.)             Chief Complaint: New Patient and Hospital Follow Up (Dysuria, urinary frequency, UTI 07/31/22)  Subjective   Subjective:   Sonya Parks is a pleasant 67 y.o. White female whom presents to the clinic for recent UTI and urinary incontinence. Follow up of UTI. Patient with suprapubic pain, dysuria,frequency, urgency and gross hematuria. Patient went to [facility] ER and treated with Macrobid 100 mg BID x 7 days . Patients urine culture with 100,000 ecoli with pansensitiviy.Patient reports pain, dysuria, frequency and urgency have resolved, however, low back pain persist. Patient using 2 pads daily for incontinence with and without urgency for years.  Patient denies fevers, chills, nausea, vomiting, hematuria, dysuria, hesitancy, suprapubic pain, headaches, vision changes, shortness of breath, chest pain.       Objective   Objective :  BP (!) 150/98 (Site: Right Arm, Patient Position: Sitting, Cuff Size: Adult)   Pulse (!) 103   Ht 1.689 m (5' 6.5")   Wt 59.2 kg (130 lb 9.6 oz)   BMI 20.76 kg/m       Gen: NAD, alert  Pulm: unlabored at rest  CV: palpable pulses  Abd: soft, Nt/ND  GU: no suprapubic tenderness, no CVAT    Data reviewed:    Current Outpatient Medications   Medication Sig    atorvastatin (LIPITOR) 10 mg Oral Tablet Take 1 Tablet (10 mg total) by mouth Once a day    buPROPion (WELLBUTRIN XL) 300 mg extended release 24 hr tablet TAKE 1 TABLET BY MOUTH EVERY MORNING    busPIRone (BUSPAR) 15 mg Oral Tablet Take 1 Tablet (15 mg total) by mouth Twice daily    celecoxib (CELEBREX) 200 mg Oral Capsule TAKE 1 CAPSULE BY MOUTH TWICE DAILY    ciclopirox (PENLAC) 8 % Solution Apply to affected nails daily; Wipe off every 7 days with alcohol and start daily process again    ciclopirox (PENLAC) 8 % Solution Apply to  affected nails daily; Wipe off every 7 days with alcohol and start daily process again    dicyclomine (BENTYL) 10 mg Oral Capsule Take 1 Capsule (10 mg total) by mouth Four times a day    estradioL (VIVELLE-DOT) 0.0375 mg/24 hr Transdermal Patch Semiweekly APPLY 1 PATCH TOPICALLY TO CLEAN, DRY, AND HAIR-FREE AREA OF THE LOWER STOMACH OR UPPER BUTTOCK AREA TWICE A WEEK (TUESDAY & FRIDAY)    itraconazole (SPORONAX) 100 mg Oral Capsule Take one tablet by mouth twice daily - Stop Erythromycin decrease Estrogen to 1/2 patch twice weekly Avoid PPIs like Omeprazole    lifitegrast (XIIDRA) 5 % Ophthalmic Dropperette Use 1 drop in both eyes twice daily    lipase-protease-amylase (CREON) 24,000 units of lipase Oral Capsule, Delayed Release(E.C.) Take 1 capsule (24,000 units of lipase total) by mouth in the morning and 1 capsule at noon and 1 capsule in the evening. Take with meals.    Lovastatin (MEVACOR) 10 mg Oral Tablet Take 1 Tablet (10 mg total) by mouth Once a day (Patient not taking: Reported on 05/15/2022)    naltrexone 4.5 mg Oral Capsule Take 1 Capsule (4.5 mg total) by mouth Once a day    neomycin 500 mg Oral Tablet Take 1 tablet (500 mg total) by mouth 3 (  three) times a day.    pancreatic enzyme replacement (CREON) 12,000-38,000 -60,000 unit Oral Capsule, Delayed Release(E.C.) Take 2 capsules (24,000 units of lipase total) by mouth 3 times daily with meals.    pantoprazole (PROTONIX) 20 mg Oral Tablet, Delayed Release (E.C.) TAKE 1 TABLET BY MOUTH ONCE DAILY    progesterone micronized (PROMETRIUM) 100 mg Oral Capsule TAKE 1 CAPSULE BY MOUTH ONCE DAILY AT BEDTIME ON DAYS 1-25 OF CALENDAR MONTH    progesterone micronized (PROMETRIUM) 100 mg Oral Capsule Take 1 capsule (100mg  total) by mouth at bedtime on days 1-25 of calendar month    rifAXIMin (XIFAXAN) 550 mg Oral Tablet Take 1 Tablet (550 mg total) by mouth Twice daily    rifAXIMin (XIFAXAN) 550 mg Oral Tablet Take 1 tablet (550 mg total) by mouth 3 (three) times a  day.        Assessment/Plan  Problem List Items Addressed This Visit    None    Recurrent UTI  I discussed the epidemiology, pathogenesis, and prevention of recurrent acute uncomplicated cystitis and pyelonephritis of recurrent urinary tract infections with patient  We discussed that, while bothersome, there is currently no evidence linking recurrent infections to significant health problems, such as hypertension or renal disease, in the absence of anatomic or functional abnormalities of the urinary tract.  We spent the majority of our time discussing various prevention strategies, including:  Behavioral modifications:  If sexually active, avoidance of spermicides  While controlled studies have not shown post-coital voiding and liberal fluid intake to be associated with a reduced risk of recurrent UTI, these measures are unlikely to be harmful  Cranberry compounds:  While there is likely little harmful effect other than an increase in calorie and glucose intake with cranberry juice, there remains a limited proven role; however, consideration may be given to a well-studied cranberry pill supplements such as TheraCran HP (Theralogix) which has a standardized formula and has been shown in scientific studies to promote urinary tract health  Antimicrobial prophylaxis options (continuous, postcoital, or self-start therapy) were also discussed for women who experience two or more symptomatic UTIs within six months or three or more over 12 months  We discussed balancing the degree of discomfort experienced from these infections with real concerns about antimicrobial resistance  The prophylactic efficacy of any antimicrobial agent depends upon the continued susceptibility to the drug of potential uropathogens colonizing patient's fecal, periurethral, and vaginal flora and must be weighed against emerging uropathogen resistance patterns  Topical estrogen for postmenopausal women as a means to increase the prevalence of  lactobacilli and decrease in E. coli vaginal colonization  We also discussed other potential, albeit unproven strategies to potentially reduce risk of UTI recurrence including:  Probiotics  Urinary antiseptics including methenamine salts (HiprexT, UrexT)  While generally of a low diagnostic yield, would recommend further work-up in the future including RBUS, CT scan, and cystoscopy  Add Cranberry Supplement  Add D. Mannose supplement  Prescription provided for Intravaginal topical conjugated estrogen cream apply pea-sized amount to urethral opening nightly for 1 month then Monday-Wednesday-Friday    I have discussed in great detail with patient the treatment of atrophic vaginitis with possible associated irritative voiding complaints and/or recurrent UTI with patient.  I have explained my rationale for using intravaginal estrogen as well as risks of vaginal bleeding pattern changes, vaginitis, breast changes, dizziness.  Additionally, I discussed with patient monitoring for cardiovascular events, malignant gynecologic and breast neoplasms and the need for continued routine women's health screening.  Bilateral Flank Pain with recent hematuria  CT Abdomen/Pelvis w/o con    OAB  I discussed the differential diagnosis, pathophysiology and nature of patient's overactive bladder/urge urinary incontinence  I also counseled patient on conservative management options including appropriate fluid management, avoidance of diuretics including caffeine and alcohol, weight loss (if applicable), dedicated pelvic floor muscle therapy and Kegel's exercises  Additionally, we discussed the role of pharmacotherapy, including risks, benefits and alternatives:  Anticholinergic therapy (e.g. Oxybutynin, Tolterodine, Solifenacin, etc)- discussed potential risks of dry mouth, dry eyes, constipation, impaired cognition, prolonged Q-T interval and urinary retention  Beta-3 agonist therapy (e.g. Mirabegron) - discussed potential risks of  hypertension, nasopharyngitis, urinary tract infection and headache    SUI  We discussed in detail the nature and pathophysiology of bothersome stress urinary incontinence.  Given the bothersome nature of her symptoms, we discussed potential treatment options including:  Conservative management, including pelvic floor "Kegel" exercises, which is generally reserved for mild cases  Estrogen replacement therapy either primarily or as an adjunct, when not contraindicated, to restore blood flow to the female pelvic organs to aid in preserving vascularity to her pelvic floor  Urethral bulking agents  Synthetic midurethral sling delivered either a retropubic or transobturator approach  Bladder neck sling utilizing autologous fascia  We also discussed the incidence, nature and risks of de novo urge urinary incontinence following treatment of stress urinary incontinence, particularly following midurethral sling        Latiffany Harwick, PA-C

## 2022-08-24 ENCOUNTER — Inpatient Hospital Stay
Admission: RE | Admit: 2022-08-24 | Discharge: 2022-08-24 | Disposition: A | Discharge status: Home / Self Care | Payer: No Typology Code available for payment source | Source: Ambulatory Visit | Attending: Physician Assistant | Admitting: Physician Assistant

## 2022-08-24 ENCOUNTER — Other Ambulatory Visit: Payer: Self-pay

## 2022-08-24 DIAGNOSIS — R109 Unspecified abdominal pain: Secondary | ICD-10-CM

## 2022-08-24 DIAGNOSIS — R319 Hematuria, unspecified: Secondary | ICD-10-CM | POA: Insufficient documentation

## 2022-09-12 ENCOUNTER — Other Ambulatory Visit: Payer: Self-pay

## 2022-09-13 ENCOUNTER — Other Ambulatory Visit: Payer: Self-pay

## 2022-09-17 ENCOUNTER — Other Ambulatory Visit: Payer: Self-pay

## 2022-09-18 ENCOUNTER — Other Ambulatory Visit: Payer: Self-pay

## 2022-09-22 ENCOUNTER — Other Ambulatory Visit: Payer: Self-pay

## 2022-09-25 ENCOUNTER — Other Ambulatory Visit: Payer: Self-pay

## 2022-09-26 ENCOUNTER — Other Ambulatory Visit: Payer: Self-pay

## 2022-09-27 ENCOUNTER — Other Ambulatory Visit: Payer: Self-pay

## 2022-09-29 ENCOUNTER — Ambulatory Visit (INDEPENDENT_AMBULATORY_CARE_PROVIDER_SITE_OTHER): Payer: No Typology Code available for payment source | Admitting: Physician Assistant

## 2022-09-29 ENCOUNTER — Ambulatory Visit (INDEPENDENT_AMBULATORY_CARE_PROVIDER_SITE_OTHER): Payer: Self-pay | Admitting: Physician Assistant

## 2022-10-16 ENCOUNTER — Other Ambulatory Visit (INDEPENDENT_AMBULATORY_CARE_PROVIDER_SITE_OTHER): Payer: Self-pay | Admitting: Internal Medicine

## 2022-10-16 ENCOUNTER — Other Ambulatory Visit: Payer: Self-pay

## 2022-10-16 MED ORDER — BUSPIRONE 15 MG TABLET
15.0000 mg | ORAL_TABLET | Freq: Two times a day (BID) | ORAL | 3 refills | Status: DC
Start: 2022-10-16 — End: 2023-11-03
  Filled 2022-10-16: qty 60, 30d supply, fill #0
  Filled 2022-12-05: qty 60, 30d supply, fill #1
  Filled 2023-10-02: qty 60, 30d supply, fill #2

## 2022-10-17 ENCOUNTER — Other Ambulatory Visit: Payer: Self-pay

## 2022-10-17 ENCOUNTER — Telehealth (RURAL_HEALTH_CENTER): Payer: Self-pay | Admitting: Internal Medicine

## 2022-10-17 NOTE — Telephone Encounter (Signed)
Per kim ok to tak both   pt notifed

## 2022-10-18 ENCOUNTER — Other Ambulatory Visit: Payer: Self-pay

## 2022-10-18 ENCOUNTER — Ambulatory Visit (INDEPENDENT_AMBULATORY_CARE_PROVIDER_SITE_OTHER): Payer: No Typology Code available for payment source | Admitting: NEUROLOGY

## 2022-10-18 DIAGNOSIS — R202 Paresthesia of skin: Secondary | ICD-10-CM

## 2022-10-18 DIAGNOSIS — R209 Unspecified disturbances of skin sensation: Secondary | ICD-10-CM

## 2022-10-18 NOTE — Procedures (Signed)
[facility]Y, [facility]  [address] [place] [address]    Procedure Note    Name: Sonya Parks MRN:  [id number]   Date: 10/18/2022 DOB:  [birth year] (66 y.o.)         [address] - NEEDLE ELECTROMYOGRAPHY; 2 EXTREMITIES W/ OR W/O RELATED PARASPINAL AREAS (AMB ONLY)    Performed by: Dyke Brackett, DO  Authorized by: Dyke Brackett, DO        ELECTROMYOGRAPHY REPORT    DATE OF SERVICE:  10/18/2022      PERFORMING PHYSICIAN:  Birdie Hopes, D.O.      REFERRING PROVIDER:  Jeral Pinch, PA-C    HISTORY:  She is a 67 year old woman who is referred for numbness and tingling in the hands and feet. This has been going on for a few years. Right arm symptoms have been alleviated with chiropractic adjustments. Symptoms in the lower extremities do tend to worsen when lower extremity edema is present.     EXAMINATION:  Strength is intact throughout. Pin sensation is intact throughout. Deep tendon reflexes are 3+ in the biceps and patellae and otherwise 2+ throughout.     NERVE CONDUCTION STUDIES:  1.Normal left ulnar-D5 and radial-snuff box sensory nerve action potentials (SNAPs).  2.Normal left median-D2 sensory amplitudes with mildly slowed conduction velocities.   3.       Normal left sural sensory amplitudes with mildly slowed conduction velocity likely secondary to limb temperature.  4.Normal left ulnar-ADM,  fibular-EDB and tibial-AH compound muscle action potentials (CMAPs).  5.Normal left and right median APB motor amplitudes with normal conduction velocities and mildly prolonged distal motor latencies.    ELECTROMYOGRAPHY:  Normal studies for the left deltoid, biceps, triceps, pronator teres, first dorsal interosseous (FDI),  vastus-lateralis, tibialis anterior, and medial gastrocnemius.     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   There is no evidence of  a large fiber polyneuropathy at this time.     This study is not designed to evaluate for small fiber sensory neuropathy.    The patient was instructed to follow up with the referring provider for further evaluation and management of the noted findings.    Patrick North, DO  Assistant Professor of Neurology  [facility]

## 2022-11-01 ENCOUNTER — Ambulatory Visit (INDEPENDENT_AMBULATORY_CARE_PROVIDER_SITE_OTHER): Payer: Self-pay | Admitting: Physician Assistant

## 2022-11-01 ENCOUNTER — Other Ambulatory Visit: Payer: Self-pay

## 2022-11-01 NOTE — Telephone Encounter (Signed)
Patient transferred from the front desk to this nurse. Patient states that she received the results of her urinalysis and that she is worried about it. Patient states that her WBC count on her UA is high. When this nurse asked patient about if she was having symptoms of a UTI, patient states she has urgency and pressure when she has to urinate. Patient was given an appt. On Friday 11/03/22 @ 1:45. Patient stated that she will be there.

## 2022-11-02 ENCOUNTER — Other Ambulatory Visit: Payer: Self-pay

## 2022-11-03 ENCOUNTER — Other Ambulatory Visit: Payer: Self-pay

## 2022-11-03 ENCOUNTER — Telehealth: Payer: No Typology Code available for payment source | Admitting: Physician Assistant

## 2022-11-03 DIAGNOSIS — N390 Urinary tract infection, site not specified: Secondary | ICD-10-CM

## 2022-11-03 DIAGNOSIS — Z87440 Personal history of urinary (tract) infections: Secondary | ICD-10-CM

## 2022-11-03 DIAGNOSIS — N393 Stress incontinence (female) (male): Secondary | ICD-10-CM

## 2022-11-03 DIAGNOSIS — N3281 Overactive bladder: Secondary | ICD-10-CM

## 2022-11-03 DIAGNOSIS — Z960 Presence of urogenital implants: Secondary | ICD-10-CM

## 2022-11-03 NOTE — Progress Notes (Signed)
[facility]OLOGY, [facility]  [address] [place] [address]    Progress Note  Telemedicine    Name: Sonya Parks MRN:  [id number]   Date: 11/03/2022 DOB:  June 13, 1955 (67 y.o.)             Chief Complaint: No chief complaint on file.  Subjective   Subjective:   Sonya Parks is a pleasant 67 y.o. White female whom presents to the clinic for recent UTI and urinary incontinence. Follow up of UTI. Today, patient with telemedicine visit as she tested positive for COVID. Patient without symptoms of  UTI ;10/27/22 microscopic urinalysis with WBC 11-30.  Patient using premarin cream, cranberry and d-mannose. Patient without UTI symptoms. Low back pain, dysuria, frequency and urgency resolved. No blood in urine. Patient using 2 pads daily with and without urgency. Patient denies fevers, chills, nausea, vomiting, hematuria, dysuria, hesitancy, suprapubic pain, headaches, vision changes, shortness of breath, chest pain.       Objective   Objective :  There were no vitals taken for this visit.    Telemedicine        Data reviewed:    Current Outpatient Medications   Medication Sig    atorvastatin (LIPITOR) 10 mg Oral Tablet Take 1 Tablet (10 mg total) by mouth Once a day    buPROPion (WELLBUTRIN XL) 300 mg extended release 24 hr tablet TAKE 1 TABLET BY MOUTH EVERY MORNING    busPIRone (BUSPAR) 15 mg Oral Tablet Take 1 Tablet (15 mg total) by mouth Twice daily    celecoxib (CELEBREX) 200 mg Oral Capsule TAKE 1 CAPSULE BY MOUTH TWICE DAILY    ciclopirox (PENLAC) 8 % Solution Apply to affected nails daily; Wipe off every 7 days with alcohol and start daily process again    ciclopirox (PENLAC) 8 % Solution Apply to affected nails daily; Wipe off every 7 days with alcohol and start daily process again    dicyclomine (BENTYL) 10 mg Oral Capsule Take 1 Capsule (10 mg total) by mouth Four times a day    estradioL (ESTRACE) 0.01 % (0.1 mg/gram) Vaginal Cream Apply a pea-sized amount to urethral opening every night  for 1 month.  Then reduce to Monday-Wednesday-Friday dosing as directed    estradioL (VIVELLE-DOT) 0.0375 mg/24 hr Transdermal Patch Semiweekly APPLY 1 PATCH TOPICALLY TO CLEAN, DRY, AND HAIR-FREE AREA OF THE LOWER STOMACH OR UPPER BUTTOCK AREA TWICE A WEEK (TUESDAY & FRIDAY)    itraconazole (SPORONAX) 100 mg Oral Capsule Take one tablet by mouth twice daily - Stop Erythromycin decrease Estrogen to 1/2 patch twice weekly Avoid PPIs like Omeprazole    lifitegrast (XIIDRA) 5 % Ophthalmic Dropperette Use 1 drop in both eyes twice daily    lipase-protease-amylase (CREON) 24,000 units of lipase Oral Capsule, Delayed Release(E.C.) Take 1 capsule (24,000 units of lipase total) by mouth in the morning and 1 capsule at noon and 1 capsule in the evening. Take with meals.    Lovastatin (MEVACOR) 10 mg Oral Tablet Take 1 Tablet (10 mg total) by mouth Once a day (Patient not taking: Reported on 05/15/2022)    naltrexone 4.5 mg Oral Capsule Take 1 Capsule (4.5 mg total) by mouth Once a day    neomycin 500 mg Oral Tablet Take 1 tablet (500 mg total) by mouth 3 (three) times a day.    pancreatic enzyme replacement (CREON) 12,000-38,000 -60,000 unit Oral Capsule, Delayed Release(E.C.) Take 2 capsules (24,000 units of lipase total) by mouth 3  times daily with meals.    pantoprazole (PROTONIX) 20 mg Oral Tablet, Delayed Release (E.C.) TAKE 1 TABLET BY MOUTH ONCE DAILY    progesterone micronized (PROMETRIUM) 100 mg Oral Capsule TAKE 1 CAPSULE BY MOUTH ONCE DAILY AT BEDTIME ON DAYS 1-25 OF CALENDAR MONTH    progesterone micronized (PROMETRIUM) 100 mg Oral Capsule Take 1 capsule (100mg  total) by mouth at bedtime on days 1-25 of calendar month    rifAXIMin (XIFAXAN) 550 mg Oral Tablet Take 1 Tablet (550 mg total) by mouth Twice daily    rifAXIMin (XIFAXAN) 550 mg Oral Tablet Take 1 tablet (550 mg total) by mouth 3 (three) times a day.        Assessment/Plan  Problem List Items Addressed This Visit    None    Recurrent UTI  I discussed the  epidemiology, pathogenesis, and prevention of recurrent acute uncomplicated cystitis and pyelonephritis of recurrent urinary tract infections with patient  We discussed that, while bothersome, there is currently no evidence linking recurrent infections to significant health problems, such as hypertension or renal disease, in the absence of anatomic or functional abnormalities of the urinary tract.  We spent the majority of our time discussing various prevention strategies, including:  Behavioral modifications:  If sexually active, avoidance of spermicides  While controlled studies have not shown post-coital voiding and liberal fluid intake to be associated with a reduced risk of recurrent UTI, these measures are unlikely to be harmful  Cranberry compounds:  While there is likely little harmful effect other than an increase in calorie and glucose intake with cranberry juice, there remains a limited proven role; however, consideration may be given to a well-studied cranberry pill supplements such as TheraCran HP (Theralogix) which has a standardized formula and has been shown in scientific studies to promote urinary tract health  Antimicrobial prophylaxis options (continuous, postcoital, or self-start therapy) were also discussed for women who experience two or more symptomatic UTIs within six months or three or more over 12 months  We discussed balancing the degree of discomfort experienced from these infections with real concerns about antimicrobial resistance  The prophylactic efficacy of any antimicrobial agent depends upon the continued susceptibility to the drug of potential uropathogens colonizing patient's fecal, periurethral, and vaginal flora and must be weighed against emerging uropathogen resistance patterns  Topical estrogen for postmenopausal women as a means to increase the prevalence of lactobacilli and decrease in E. coli vaginal colonization  We also discussed other potential, albeit unproven  strategies to potentially reduce risk of UTI recurrence including:  Probiotics  Urinary antiseptics including methenamine salts (HiprexT, UrexT)  While generally of a low diagnostic yield, would recommend further work-up in the future including RBUS, CT scan, and cystoscopy  Continue Cranberry Supplement  Continue D. Mannose supplement  Continue Intravaginal topical conjugated estrogen cream apply pea-sized amount to urethral opening nightly for 1 month then Monday-Wednesday-Friday    I have discussed in great detail with patient the treatment of atrophic vaginitis with possible associated irritative voiding complaints and/or recurrent UTI with patient.  I have explained my rationale for using intravaginal estrogen as well as risks of vaginal bleeding pattern changes, vaginitis, breast changes, dizziness.  Additionally, I discussed with patient monitoring for cardiovascular events, malignant gynecologic and breast neoplasms and the need for continued routine women's health screening.    Bilateral Flank Pain with recent hematuria; resolved  CT Abdomen/Pelvis w/o con reviewed CT Abdomen/Pelvis w/o con without renal calculi or hydronephrosis. Large amount of retained stool.  OAB  I discussed the differential diagnosis, pathophysiology and nature of patient's overactive bladder/urge urinary incontinence  I also counseled patient on conservative management options including appropriate fluid management, avoidance of diuretics including caffeine and alcohol, weight loss (if applicable), dedicated pelvic floor muscle therapy and Kegel's exercises  Additionally, we discussed the role of pharmacotherapy, including risks, benefits and alternatives:  Anticholinergic therapy (e.g. Oxybutynin, Tolterodine, Solifenacin, etc)- discussed potential risks of dry mouth, dry eyes, constipation, impaired cognition, prolonged Q-T interval and urinary retention  Beta-3 agonist therapy (e.g. Mirabegron) - discussed potential risks of  hypertension, nasopharyngitis, urinary tract infection and headache    SUI  We discussed in detail the nature and pathophysiology of bothersome stress urinary incontinence.  Given the bothersome nature of her symptoms, we discussed potential treatment options including:  Conservative management, including pelvic floor "Kegel" exercises, which is generally reserved for mild cases  Estrogen replacement therapy either primarily or as an adjunct, when not contraindicated, to restore blood flow to the female pelvic organs to aid in preserving vascularity to her pelvic floor  Urethral bulking agents  Synthetic midurethral sling delivered either a retropubic or transobturator approach  Bladder neck sling utilizing autologous fascia  We also discussed the incidence, nature and risks of de novo urge urinary incontinence following treatment of stress urinary incontinence, particularly following midurethral sling        Leonette Tischer, PA-C

## 2022-11-04 ENCOUNTER — Other Ambulatory Visit: Payer: Self-pay

## 2022-11-04 MED ORDER — PAXLOVID 300 MG (150 MG X 2)-100 MG TABLETS IN A DOSE PACK
3.0000 | ORAL_TABLET | Freq: Two times a day (BID) | ORAL | 0 refills | Status: AC
Start: 2022-11-04 — End: 2022-11-09
  Filled 2022-11-04: qty 30, 5d supply, fill #0

## 2022-11-08 ENCOUNTER — Other Ambulatory Visit: Payer: Self-pay

## 2022-11-24 ENCOUNTER — Encounter (INDEPENDENT_AMBULATORY_CARE_PROVIDER_SITE_OTHER): Payer: Self-pay | Admitting: Physician Assistant

## 2022-12-05 ENCOUNTER — Other Ambulatory Visit: Payer: Self-pay

## 2022-12-05 ENCOUNTER — Ambulatory Visit: Payer: No Typology Code available for payment source | Attending: Internal Medicine | Admitting: Internal Medicine

## 2022-12-05 ENCOUNTER — Encounter (INDEPENDENT_AMBULATORY_CARE_PROVIDER_SITE_OTHER): Payer: Self-pay | Admitting: Internal Medicine

## 2022-12-05 DIAGNOSIS — F411 Generalized anxiety disorder: Secondary | ICD-10-CM

## 2022-12-05 DIAGNOSIS — M1990 Unspecified osteoarthritis, unspecified site: Secondary | ICD-10-CM

## 2022-12-05 DIAGNOSIS — R140 Abdominal distension (gaseous): Secondary | ICD-10-CM

## 2022-12-05 DIAGNOSIS — K582 Mixed irritable bowel syndrome: Secondary | ICD-10-CM

## 2022-12-05 DIAGNOSIS — K638219 Small intestinal bacterial overgrowth, unspecified: Secondary | ICD-10-CM

## 2022-12-05 DIAGNOSIS — R41840 Attention and concentration deficit: Secondary | ICD-10-CM

## 2022-12-05 DIAGNOSIS — K219 Gastro-esophageal reflux disease without esophagitis: Secondary | ICD-10-CM

## 2022-12-05 DIAGNOSIS — F330 Major depressive disorder, recurrent, mild: Secondary | ICD-10-CM

## 2022-12-05 MED ORDER — PANTOPRAZOLE 20 MG TABLET,DELAYED RELEASE
DELAYED_RELEASE_TABLET | ORAL | 3 refills | Status: DC
Start: 2022-12-05 — End: 2022-12-20
  Filled 2022-12-05: qty 90, 90d supply, fill #0

## 2022-12-05 MED ORDER — DULOXETINE 30 MG CAPSULE,DELAYED RELEASE
30.0000 mg | DELAYED_RELEASE_CAPSULE | Freq: Two times a day (BID) | ORAL | 1 refills | Status: DC
Start: 2022-12-05 — End: 2022-12-18
  Filled 2022-12-05 (×2): qty 60, 30d supply, fill #0
  Filled 2022-12-06: qty 30, 15d supply, fill #0

## 2022-12-05 MED ORDER — VOQUEZNA 20 MG TABLET
20.0000 mg | ORAL_TABLET | Freq: Every day | ORAL | 3 refills | Status: DC
Start: 2022-12-05 — End: 2023-01-07
  Filled 2022-12-06: qty 90, 90d supply, fill #0
  Filled 2022-12-12 – 2023-01-05 (×2): qty 30, 30d supply, fill #0

## 2022-12-05 MED ORDER — ESTRADIOL 0.0375 MG/24 HR SEMIWEEKLY TRANSDERMAL PATCH
MEDICATED_PATCH | TRANSDERMAL | 4 refills | Status: DC
Start: 2022-12-05 — End: 2023-06-05
  Filled 2022-12-05: qty 8, 28d supply, fill #0
  Filled 2023-01-05: qty 8, 28d supply, fill #1
  Filled 2023-02-09: qty 8, 28d supply, fill #2
  Filled 2023-03-09: qty 8, 28d supply, fill #3
  Filled 2023-04-23: qty 8, 28d supply, fill #4

## 2022-12-05 NOTE — Progress Notes (Signed)
INTERNAL MEDICINE, BUILDING A  [address]  [place] [address]  Operated by [employer]  History and Physical    Name: Sonya Parks MRN:  [id number]   Date: 12/05/2022 DOB:  04-18-1955 (67 y.o.)         Name: Sonya Parks                       Date of Birth: 04-Jan-1956   MRN:  [id number]                         Date of visit: 12/05/2022     PCP: Samuella Cota, PA-C     Subjective  Sonya Parks is a 67 y.o. year old female who presents for Advice Only (Talk about her life with meds and who she is seeing.  Having brain fog   having pain  would like to see wha t you can do for it..   having trouble with functioning.  Sleeping good and get up to drink she is dry.    )   to clinic.  No specialty comments available.   Patient Active Problem List    Diagnosis Date Noted    OA (osteoarthritis) 12/05/2022    Fatigue 10/09/2021    Small intestinal bacterial overgrowth (SIBO) 10/09/2021    B12 deficiency 10/09/2021    Mixed hyperlipidemia 09/21/2021    Esophageal reflux 09/21/2021    Vitamin D deficiency 09/21/2021    Degenerative arthritis 09/21/2021      Current Outpatient Medications   Medication Sig    atorvastatin (LIPITOR) 10 mg Oral Tablet Take 1 Tablet (10 mg total) by mouth Once a day    buPROPion (WELLBUTRIN XL) 300 mg extended release 24 hr tablet TAKE 1 TABLET BY MOUTH EVERY MORNING    busPIRone (BUSPAR) 15 mg Oral Tablet Take 1 Tablet (15 mg total) by mouth Twice daily    celecoxib (CELEBREX) 200 mg Oral Capsule TAKE 1 CAPSULE BY MOUTH TWICE DAILY    ciclopirox (PENLAC) 8 % Solution Apply to affected nails daily; Wipe off every 7 days with alcohol and start daily process again    ciclopirox (PENLAC) 8 % Solution Apply to affected nails daily; Wipe off every 7 days with alcohol and start daily process again    dicyclomine (BENTYL) 10 mg Oral Capsule Take 1 Capsule (10 mg total) by mouth Four times a day    estradioL (ESTRACE) 0.01 % (0.1 mg/gram) Vaginal Cream Apply a  pea-sized amount to urethral opening every night for 1 month.  Then reduce to Monday-Wednesday-Friday dosing as directed    estradioL (VIVELLE-DOT) 0.0375 mg/24 hr Transdermal Patch Semiweekly APPLY 1 PATCH TOPICALLY TO CLEAN, DRY, AND HAIR-FREE AREA OF THE LOWER STOMACH OR UPPER BUTTOCK AREA TWICE A WEEK (TUESDAY & FRIDAY)    itraconazole (SPORONAX) 100 mg Oral Capsule Take one tablet by mouth twice daily - Stop Erythromycin decrease Estrogen to 1/2 patch twice weekly Avoid PPIs like Omeprazole    lifitegrast (XIIDRA) 5 % Ophthalmic Dropperette Use 1 drop in both eyes twice daily    lipase-protease-amylase (CREON) 24,000 units of lipase Oral Capsule, Delayed Release(E.C.) Take 1 capsule (24,000 units of lipase total) by mouth in the morning and 1 capsule at noon and 1 capsule in the evening. Take with meals.    Lovastatin (MEVACOR) 10 mg Oral Tablet Take 1 Tablet (10 mg total) by mouth Once a day (Patient not  taking: Reported on 05/15/2022)    naltrexone 4.5 mg Oral Capsule Take 1 Capsule (4.5 mg total) by mouth Once a day    neomycin 500 mg Oral Tablet Take 1 tablet (500 mg total) by mouth 3 (three) times a day.    pancreatic enzyme replacement (CREON) 12,000-38,000 -60,000 unit Oral Capsule, Delayed Release(E.C.) Take 2 capsules (24,000 units of lipase total) by mouth 3 times daily with meals.    pantoprazole (PROTONIX) 20 mg Oral Tablet, Delayed Release (E.C.) TAKE 1 TABLET BY MOUTH ONCE DAILY    progesterone micronized (PROMETRIUM) 100 mg Oral Capsule TAKE 1 CAPSULE BY MOUTH ONCE DAILY AT BEDTIME ON DAYS 1-25 OF CALENDAR MONTH    progesterone micronized (PROMETRIUM) 100 mg Oral Capsule Take 1 capsule (100mg  total) by mouth at bedtime on days 1-25 of calendar month    rifAXIMin (XIFAXAN) 550 mg Oral Tablet Take 1 Tablet (550 mg total) by mouth Twice daily    rifAXIMin (XIFAXAN) 550 mg Oral Tablet Take 1 tablet (550 mg total) by mouth 3 (three) times a day.      Fu Visit     Pt requested to talk  and go over   recent  testing      FU  Covid  +   Oct   2024         Co tingling in feet  seen by Dr  Sedalia Muta       Mild  CTS  and  no need for fu    Fu Depression anxiety  and  focus  Focus still a problem at times    pt has been on Wellbutrin and Buspar            Says she does get  quiver in voice and   people getting  on her  nerves    Pt has issues of life and a lot going on    Pt is therapist and knows when she needs  something   Pt was on Cymbalta  in passed  and discussed  trying it again      Fu OA   seen by Dr Tasia Catchings labs in Oct  and  fu in  Feb    DC  plaquenil  and she says she misses it      She says he wrote  fibromyalgia  on her chart   no new orders  this time  she is still on her  Celebrex      Seen by  [facility]  in  2021 Dr Marylou Flesher       Fu Urology  Dr Nancy Fetter           Recent  urine   negative    Fu Hip  pain to see  ortho at  [facility]  in  Jan   2025     Dr  Vernell Morgans  [facility]  retiring   but he did her hip in the past and pt will see  his replacement  physician     Fu SIBO    Pt is on rifaxamin and feels it is working       Fu IMMUNOLOGY  Pt seen by  Dr Sherilyn Banker  pt has been getting   vaccines   IGA  low  and  discussed  immunoglobulin  `                      REVIEW OF SYSTEMS:   Review of Systems  Review of Systems have been reviewed    Objective:   There were no vitals taken for this visit.             PHYSICAL EXAM  Physical Exam  Gen: NAD. Alert.   Exam  Const  General: cooperative, no acute distress and alert  Resp  Effort & Inspection: able to speak in complete sentences  Psych  Mental Status: mental status grossly normal  Mood: congruent mood  Affect: normal affect  Attitude: cooperative  Insight: insight good  Judgment: judgment good     Orders Placed This Encounter    pantoprazole (PROTONIX) 20 mg Oral Tablet, Delayed Release (E.C.)      Assessment & Plan  Small intestinal bacterial overgrowth (SIBO)    Osteoarthritis, unspecified osteoarthritis type, unspecified site    Irritable bowel syndrome with both  constipation and diarrhea    Abdominal bloating    GAD (generalized anxiety disorder)    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  Chart reviewed and updated.   Continue current treatment.  Keep follow-up appointment.   Vaccine hx reviewed.    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    Samuella Cota, PA-C

## 2022-12-06 ENCOUNTER — Other Ambulatory Visit: Payer: Self-pay

## 2022-12-07 DIAGNOSIS — F411 Generalized anxiety disorder: Secondary | ICD-10-CM | POA: Insufficient documentation

## 2022-12-07 DIAGNOSIS — F330 Major depressive disorder, recurrent, mild: Secondary | ICD-10-CM | POA: Insufficient documentation

## 2022-12-07 NOTE — Assessment & Plan Note (Signed)
Not responding well to  Protonix  bid  will change  to  voquenza

## 2022-12-08 ENCOUNTER — Other Ambulatory Visit: Payer: Self-pay

## 2022-12-12 ENCOUNTER — Other Ambulatory Visit: Payer: Self-pay

## 2022-12-16 ENCOUNTER — Encounter (HOSPITAL_BASED_OUTPATIENT_CLINIC_OR_DEPARTMENT_OTHER): Payer: Self-pay

## 2022-12-16 ENCOUNTER — Other Ambulatory Visit: Payer: Self-pay

## 2022-12-16 ENCOUNTER — Emergency Department
Admission: EM | Admit: 2022-12-16 | Discharge: 2022-12-16 | Disposition: A | Discharge status: Home / Self Care | Payer: No Typology Code available for payment source | Attending: NURSE PRACTITIONER | Admitting: NURSE PRACTITIONER

## 2022-12-16 ENCOUNTER — Emergency Department (HOSPITAL_BASED_OUTPATIENT_CLINIC_OR_DEPARTMENT_OTHER): Payer: No Typology Code available for payment source

## 2022-12-16 DIAGNOSIS — K838 Other specified diseases of biliary tract: Secondary | ICD-10-CM | POA: Insufficient documentation

## 2022-12-16 DIAGNOSIS — R030 Elevated blood-pressure reading, without diagnosis of hypertension: Secondary | ICD-10-CM

## 2022-12-16 DIAGNOSIS — Z87891 Personal history of nicotine dependence: Secondary | ICD-10-CM | POA: Insufficient documentation

## 2022-12-16 DIAGNOSIS — R748 Abnormal levels of other serum enzymes: Secondary | ICD-10-CM | POA: Insufficient documentation

## 2022-12-16 DIAGNOSIS — E871 Hypo-osmolality and hyponatremia: Secondary | ICD-10-CM | POA: Insufficient documentation

## 2022-12-16 DIAGNOSIS — Z8679 Personal history of other diseases of the circulatory system: Secondary | ICD-10-CM

## 2022-12-16 DIAGNOSIS — I10 Essential (primary) hypertension: Secondary | ICD-10-CM | POA: Insufficient documentation

## 2022-12-16 DIAGNOSIS — E785 Hyperlipidemia, unspecified: Secondary | ICD-10-CM

## 2022-12-16 DIAGNOSIS — R194 Change in bowel habit: Secondary | ICD-10-CM | POA: Insufficient documentation

## 2022-12-16 LAB — COMPREHENSIVE METABOLIC PANEL, NON-FASTING
ALBUMIN/GLOBULIN RATIO: 1.3 (ref 0.8–1.4)
ALBUMIN: 3.9 g/dL (ref 3.4–5.0)
ALKALINE PHOSPHATASE: 60 U/L (ref 46–116)
ALT (SGPT): 88 U/L — ABNORMAL HIGH (ref <=78)
ANION GAP: 8 mmol/L (ref 4–13)
AST (SGOT): 79 U/L — ABNORMAL HIGH (ref 15–37)
BILIRUBIN TOTAL: 1 mg/dL (ref 0.2–1.0)
BUN/CREA RATIO: 17
BUN: 11 mg/dL (ref 7–18)
CALCIUM, CORRECTED: 8.8 mg/dL
CALCIUM: 8.7 mg/dL (ref 8.5–10.1)
CHLORIDE: 89 mmol/L — ABNORMAL LOW (ref 98–107)
CO2 TOTAL: 29 mmol/L (ref 21–32)
CREATININE: 0.66 mg/dL (ref 0.55–1.02)
ESTIMATED GFR: 97 mL/min/{1.73_m2} (ref >59)
GLOBULIN: 3
GLUCOSE: 110 mg/dL — ABNORMAL HIGH (ref 74–106)
OSMOLALITY, CALCULATED: 253 mosm/kg — ABNORMAL LOW (ref 270–290)
POTASSIUM: 3.8 mmol/L (ref 3.5–5.1)
PROTEIN TOTAL: 6.9 g/dL (ref 6.4–8.2)
SODIUM: 126 mmol/L — ABNORMAL LOW (ref 136–145)

## 2022-12-16 LAB — THYROID STIMULATING HORMONE (SENSITIVE TSH): TSH: 4.191 u[IU]/mL — ABNORMAL HIGH (ref 0.358–3.740)

## 2022-12-16 LAB — CBC WITH DIFF
BASOPHIL #: 0.04 10*3/uL (ref 0.00–0.30)
BASOPHIL %: 0 % (ref 0–3)
EOSINOPHIL #: 0.09 10*3/uL (ref 0.00–0.80)
EOSINOPHIL %: 1 % (ref 1–7)
HCT: 42.9 % (ref 37.0–47.0)
HGB: 14.7 g/dL (ref 12.5–16.0)
LYMPHOCYTE #: 1.06 10*3/uL — ABNORMAL LOW (ref 1.10–5.00)
LYMPHOCYTE %: 11 % — ABNORMAL LOW (ref 25–45)
MCH: 31.4 pg (ref 27.0–32.0)
MCHC: 34.3 g/dL (ref 32.0–36.0)
MCV: 91.4 fL (ref 78.0–99.0)
MONOCYTE #: 0.84 10*3/uL (ref 0.00–1.30)
MONOCYTE %: 8 % (ref 0–12)
MPV: 7.1 fL — ABNORMAL LOW (ref 7.4–10.4)
NEUTROPHIL #: 7.98 10*3/uL (ref 1.80–8.40)
NEUTROPHIL %: 80 % — ABNORMAL HIGH (ref 40–76)
PLATELETS: 236 10*3/uL (ref 140–440)
RBC: 4.69 10*6/uL (ref 4.20–5.40)
RDW: 15.8 % — ABNORMAL HIGH (ref 11.6–14.8)
WBC: 10 10*3/uL (ref 4.0–10.5)

## 2022-12-16 LAB — TROPONIN-I: TROPONIN I: 10 ng/L (ref <15)

## 2022-12-16 LAB — MAGNESIUM: MAGNESIUM: 2 mg/dL (ref 1.8–2.4)

## 2022-12-16 MED ORDER — SODIUM CHLORIDE 0.9 % IV BOLUS
1000.0000 mL | INJECTION | Status: DC
Start: 2022-12-16 — End: 2022-12-16

## 2022-12-16 MED ORDER — SODIUM CHLORIDE 0.9 % IV BOLUS
1000.0000 mL | INJECTION | Status: AC
Start: 2022-12-16 — End: 2022-12-16
  Administered 2022-12-16: 1000 mL via INTRAVENOUS
  Administered 2022-12-16: 0 mL via INTRAVENOUS

## 2022-12-16 MED ORDER — ONDANSETRON HCL (PF) 4 MG/2 ML INJECTION SOLUTION
INTRAMUSCULAR | Status: AC
Start: 2022-12-16 — End: 2022-12-16
  Filled 2022-12-16: qty 2

## 2022-12-16 MED ORDER — ONDANSETRON HCL (PF) 4 MG/2 ML INJECTION SOLUTION
4.0000 mg | INTRAMUSCULAR | Status: AC
Start: 2022-12-16 — End: 2022-12-16
  Administered 2022-12-16: 4 mg via INTRAVENOUS

## 2022-12-16 MED ORDER — SODIUM CHLORIDE 0.9 % (FLUSH) INJECTION SYRINGE
3.0000 mL | INJECTION | Freq: Three times a day (TID) | INTRAMUSCULAR | Status: DC
Start: 2022-12-16 — End: 2022-12-16

## 2022-12-16 MED ORDER — SODIUM CHLORIDE 0.9 % (FLUSH) INJECTION SYRINGE
3.0000 mL | INJECTION | INTRAMUSCULAR | Status: DC | PRN
Start: 2022-12-16 — End: 2022-12-16

## 2022-12-16 MED ORDER — LISINOPRIL 5 MG TABLET
5.0000 mg | ORAL_TABLET | Freq: Every day | ORAL | 0 refills | Status: DC
Start: 2022-12-16 — End: 2023-01-01

## 2022-12-16 NOTE — ED Provider Notes (Signed)
[facility]  Emergency Department  Advanced Practice Provider Note      CHIEF COMPLAINT  Chief Complaint   Patient presents with    Hypertension     HISTORY OF PRESENT ILLNESS  Sonya Parks, date of birth Nov 22, 1955, is a 67 y.o. female who presented to the Emergency Department.    Patient is a 67 year old female, with a history of hypertension and hyperlipidemia, who presents to the ED with concerns of hypertension.  Patient states that she was on blood pressure medicine in the past but this was discontinued by her cardiologist for unknown reason.  Patient reports her body has periods when it "feels hot." Also reports a slight headache but denies any dizziness or syncope.  Denies any falls.  Denies any change in vision.  Denies any chest pain or palpitations.  Denies any shortness of breath.  Denies any swelling to the extremities.      PAST MEDICAL/SURGICAL/FAMILY/SOCIAL HISTORY  Past Medical History:   Diagnosis Date    Degenerative arthritis     Esophageal reflux     Fatigue 10/09/2021    GAD (generalized anxiety disorder)     Gait disturbance     H/O endoscopy     x3 last one june 2021 dr Karna Christmas    Hyponatremia     IBS (irritable bowel syndrome)     Mixed hyperlipidemia     Osteoarthritis     Other specified disorders of temporomandibular joint     Polymyalgia rheumatica (CMS HCC)     S/P gastrointestinal surgery     sphincter of oddi 2019    Vitamin D deficiency     Weakness        Past Surgical History:   Procedure Laterality Date    BREAST SURGERY      2008 implants removed july 2019 dr grubb    COLONOSCOPY      2010 june  2021  dr Karna Christmas    DENTAL SURGERY      teeth implants    HX APPENDECTOMY      HX CATARACT REMOVAL      HX CHOLECYSTECTOMY      HX HIP REPLACEMENT      HX TONSILLECTOMY      SINUS SURGERY      implants and sinus life with bone graft       Family Medical History:       Problem Relation (Age of Onset)    No Known Problems Mother, Father, Sister, Brother, Maternal  Grandmother, Maternal Grandfather, Paternal Grandmother, Paternal Grandfather, Daughter, Son, Maternal Aunt, Maternal Uncle, Paternal Aunt, Paternal Uncle, Other          Social History     Socioeconomic History    Marital status: Divorced   Tobacco Use    Smoking status: Former     Types: Cigarettes    Smokeless tobacco: Never   Vaping Use    Vaping status: Never Used   Substance and Sexual Activity    Alcohol use: Not Currently    Drug use: Never     Social Determinants of Health     Financial Resource Strain: Low Risk  (01/16/2022)    Financial Resource Strain     SDOH Financial: No   Transportation Needs: Low Risk  (01/16/2022)    Transportation Needs     SDOH Transportation: No   Social Connections: Low Risk  (01/16/2022)    Social Connections     SDOH Social Isolation: 5 or more  times a week   Intimate Partner Violence: Low Risk  (01/16/2022)    Intimate Partner Violence     SDOH Domestic Violence: No   Housing Stability: Low Risk  (01/16/2022)    Housing Stability     SDOH Housing Situation: I have housing.     SDOH Housing Worry: No      ALLERGIES  Allergies   Allergen Reactions    Bee Venom Protein (Honey Bee) Swelling     hives    Metoprolol  Other Adverse Reaction (Add comment)     Severe fatigue    Penicillins Rash    Berberine      N/v    Garlic     Oregano Oil      N/v     Poison Ivy Extract Hives/ Urticaria           PHYSICAL EXAM  VITAL SIGNS:  Filed Vitals:    12/16/22 1009 12/16/22 1045 12/16/22 1100   BP: (!) 154/97 (!) 160/96 (!) 154/92   Pulse: (!) 102  80   Resp: 17  15   Temp: 36.6 C (97.8 F)     SpO2: 100%  97%     Constitutional: Awake. Well appearing. Average body weight. No distress noted.   HEENT:   Head: Normocephalic and atraumatic.   Mouth/Throat: Oropharynx pink and moist. No postnasal drainage, erythema or exudates.   Eyes: EOMI, PERRL   Ears: External ear normal, no drainage noted; No postauricular tenderness, erythema, swelling or mass  Nose:External nose normal, no  drainage  Cardiovascular: Regular rate. S1, S2 with no murmur or gallop heard. No swelling to extremities  Pulmonary/Chest: Breath sounds clear and equal bilaterally. No wheezes, rales or chest tenderness. No respiratory distress.   Abdominal: Bowel sound normal. Abdomen soft, no tenderness, rebound or guarding.        Musculoskeletal: No tenderness or deformity. Normal muscle tone and strength.   Skin: warm and dry. No rash, redness, or bruising  Psychiatric: normal mood and affect. Behavior is normal.   Neurological: Alert, oriented. Normal gait. No focal weakness noted. No sensory deficit    Nursing notes reviewed.       DIAGNOSTICS  Labs:  Labs listed below were reviewed and interpreted by me.  Results for orders placed or performed during the hospital encounter of 12/16/22   COMPREHENSIVE METABOLIC PANEL, NON-FASTING   Result Value Ref Range    SODIUM 126 (L) 136 - 145 mmol/L    POTASSIUM 3.8 3.5 - 5.1 mmol/L    CHLORIDE 89 (L) 98 - 107 mmol/L    CO2 TOTAL 29 21 - 32 mmol/L    ANION GAP 8 4 - 13 mmol/L    BUN 11 7 - 18 mg/dL    CREATININE 6.30 1.60 - 1.02 mg/dL    BUN/CREA RATIO 17     ESTIMATED GFR 97 >59 mL/min/1.49m^2    ALBUMIN 3.9 3.4 - 5.0 g/dL    CALCIUM 8.7 8.5 - 10.9 mg/dL    GLUCOSE 323 (H) 74 - 106 mg/dL    ALKALINE PHOSPHATASE 60 46 - 116 U/L    ALT (SGPT) 88 (H) <=78 U/L    AST (SGOT) 79 (H) 15 - 37 U/L    BILIRUBIN TOTAL 1.0 0.2 - 1.0 mg/dL    PROTEIN TOTAL 6.9 6.4 - 8.2 g/dL    ALBUMIN/GLOBULIN RATIO 1.3 0.8 - 1.4    OSMOLALITY, CALCULATED 253 (L) 270 - 290 mOsm/kg    CALCIUM, CORRECTED 8.8  mg/dL    GLOBULIN 3.0    MAGNESIUM   Result Value Ref Range    MAGNESIUM 2.0 1.8 - 2.4 mg/dL   TROPONIN-I   Result Value Ref Range    TROPONIN I 10 <15 ng/L   THYROID STIMULATING HORMONE (SENSITIVE TSH)   Result Value Ref Range    TSH 4.191 (H) 0.358 - 3.740 uIU/mL   CBC WITH DIFF   Result Value Ref Range    WBC 10.0 4.0 - 10.5 x10^3/uL    RBC 4.69 4.20 - 5.40 x10^6/uL    HGB 14.7 12.5 - 16.0 g/dL    HCT 16.1  09.6 - 04.5 %    MCV 91.4 78.0 - 99.0 fL    MCH 31.4 27.0 - 32.0 pg    MCHC 34.3 32.0 - 36.0 g/dL    RDW 40.9 (H) 81.1 - 14.8 %    PLATELETS 236 140 - 440 x10^3/uL    MPV 7.1 (L) 7.4 - 10.4 fL    NEUTROPHIL % 80 (H) 40 - 76 %    LYMPHOCYTE % 11 (L) 25 - 45 %    MONOCYTE % 8 0 - 12 %    EOSINOPHIL % 1 1 - 7 %    BASOPHIL % 0 0 - 3 %    NEUTROPHIL # 7.98 1.80 - 8.40 x10^3/uL    LYMPHOCYTE # 1.06 (L) 1.10 - 5.00 x10^3/uL    MONOCYTE # 0.84 0.00 - 1.30 x10^3/uL    EOSINOPHIL # 0.09 0.00 - 0.80 x10^3/uL    BASOPHIL # 0.04 0.00 - 0.30 x10^3/uL     Radiology:  Results for orders placed or performed during the hospital encounter of 12/16/22   CT ABDOMEN PELVIS WO IV CONTRAST     Status: None    Narrative    Lichelle E Plaia    RADIOLOGIST: Murray Hodgkins    CT ABDOMEN PELVIS WO IV CONTRAST performed on 12/16/2022 12:55 PM    CLINICAL HISTORY: Elevated liver enzymes.  Elevated liver enzymes    TECHNIQUE:  Abdomen and pelvis CT without intravenous contrast. No oral contrast.    COMPARISON:  08/24/2022    FINDINGS:  Noncontrast technique limits evaluation of the abdominal and pelvic viscera.    Lung bases: Clear    Liver:   Unremarkable.    Gallbladder:   There is mild dilatation of the common bile duct which could BE postcholecystectomy state. Correlation with laboratory results are recommended.    Spleen:   Unremarkable.    Pancreas:   Unremarkable.    Adrenals:   Unremarkable.    Kidneys:   Unremarkable.    Bladder:  Unremarkable.    Uterus and Adnexa:  Unremarkable.    Bowel:   Increased stool in the colon. As no dilated small bowel or colon bowel or inflammatory changes. The stomach is fluid and debris filled mildly distended.    Appendix:  Not visualized    Lymph nodes:  No suspicious lymph node enlargement.    Vasculature:   Mild diffuse atherosclerotic calcifications are noted.     Peritoneum / Retroperitoneum: No ascites.  No free air.    Bones:   Unremarkable.          Impression    Mild dilatation of the common bile  duct which could BE postcholecystectomy state. Correlation with laboratory results are recommended.  Increased stool in the colon  Fluid and debris filled mildly distended stomach. This could be due to gastritis.  One or more dose reduction techniques were used (e.g., Automated exposure control, adjustment of the mA and/or kV according to patient size, use of iterative reconstruction technique).      Radiologist location ID: [id number]         ED COURSE/MEDICAL DECISION MAKING      ED Course as of 12/16/22 1522   Sat Dec 16, 2022   1201 WBC: 10.0  Normal. PMNs 80   1201 HGB: 14.7  Hct 42.9   1201 PLATELET COUNT: 236  Normal   1218 SODIUM(!): 126  Low. Glucose 110   1219 CREATININE: 0.66  GFR 97 BUN 11   1220 BILIRUBIN, TOTAL: 1.0  ALT 88 AST 79 Alk Phos 60   1220 TSH(!): 4.191  Slightly elevated   1220 TROPONIN-I: 10  Normal   1324 CT ABDOMEN PELVIS WO IV CONTRAST  IMPRESSION:  Mild dilatation of the common bile duct which could BE postcholecystectomy state. Correlation with laboratory results are recommended.  Increased stool in the colon  Fluid and debris filled mildly distended stomach. This could be due to gastritis.     1518 Discussed patient's case and findings with Dr Concha Se who advised patient could be evaluated outpatient in the office.       Medical Decision Making  Patient is a 67 year old female, with a history of hypertension and hyperlipidemia, who presents to the ED with concerns of hypertension.  Patient states that she was on blood pressure medicine in the past but this was discontinued by her cardiologist for unknown reason.  Patient reports her body has periods when it "feels hot." Also reports a slight headache but denies any dizziness or syncope.  Denies any falls.  Denies any change in vision.  Denies any chest pain or palpitations.  Denies any shortness of breath.  Denies any swelling to the extremities.    Differential diagnosis includes but is not limited to hypertensive urgency,  hypertensive crisis, hypertension uncontrolled, CAD, TIA, CVA, migraine    Patient's white count is normal at 10, neutrophils 80.  Patient's hemoglobin and hematocrit are normal at 14.7 and 42.9.  Platelet count is 236.  Patient's electrolytes are normal except sodium is low at 126.  Glucose is 110.  Patient given IV normal saline.  Patient's renal function shows creatinine 0.66, GFR 97 and BUN 11.  Patient's liver enzymes show total bilirubin 1.0, ALT 88, AST 79 and alk phos 60.  Patient's CT of the abdomen pelvis shows some mild dilation of the common bile duct which could be post cholecystectomy state.  Discussed with Dr. Concha Se, gastroenterologist who advised that patient could follow-up in the office for further evaluation.    Patient reports she is feeling better after receiving IV fluids and Zofran.  Patient has not had any additional vomiting or diarrhea.  Patient will be discharged with a low-dose lisinopril and encouraged to follow up with her PCP next week.  Patient will be discharged with information to follow up with Dr. Concha Se.  Given strict return to ED precautions.  Patient verbalizes understanding and agrees to plan of care.    Amount and/or Complexity of Data Reviewed  Labs: ordered.  Radiology: ordered. Decision-making details documented in ED Course.  ECG/medicine tests: independent interpretation performed.    Risk  Prescription drug management.      CLINICAL IMPRESSION  Clinical Impression   Elevated blood pressure reading (Primary)   History of hypertension   Hyponatremia   Elevated liver enzymes  DISPOSITION  Discharged       DISCHARGE MEDICATIONS  Current Discharge Medication List        START taking these medications    Details   lisinopriL (PRINIVIL) 5 mg Oral Tablet Take 1 Tablet (5 mg total) by mouth Once a day for 30 days  Qty: 30 Tablet, Refills: 0             Sherlie Ban- FNP-C, ENP-C 12/16/2022, 11:37   [facility]  Department of Emergency Medicine  [facility]    This note was partially generated using MModal Fluency Direct system, and there may be some incorrect words, spellings, and punctuation that were not noted in checking the note before saving.    -----

## 2022-12-16 NOTE — ED Triage Notes (Signed)
Patient states that her BP woke her up this morning. States she was having a warm feeling in her body. States that it is not a whole body warming but different parts of her body. States she is also dizzy,and lightheaded..  Patient states she has just sat in her recliner and drank water lots of water and she has had BM's many time.  States it was very very soft. Also ringing in her ears and tingling in her feet.  States she has had the warm sensation several times before.

## 2022-12-16 NOTE — Discharge Instructions (Signed)
Continue to monitor symptoms. Take medications as prescribed.  Make sure you are staying hydrated with plenty of fluids.  Get plenty of rest.  Follow-up with your family doctor within 48-72 hours.  Return to ER immediately for any new or worsening symptoms.    Please ensure all questions or concerns are addressed prior to leaving the hospital. We want to make sure your concerns are addressed to make sure you are as safe and healthy as possible. By leaving the hospital, it is understood you are in agreement with your treatment plan.    Please discuss all medications with your pharmacist to ensure there are no concerns of interactions.    Thank you for allowing [place] to be part of your care.

## 2022-12-16 NOTE — ED Nurses Note (Signed)
Patient discharged home with family.  AVS reviewed with patient/care giver.  A written copy of the AVS and discharge instructions was given to the patient/care giver. Scripts handed to patient/care giver. Questions sufficiently answered as needed.  Patient/care giver encouraged to follow up with PCP as indicated.  In the event of an emergency, patient/care giver instructed to call 911 or go to the nearest emergency room.

## 2022-12-17 ENCOUNTER — Telehealth (HOSPITAL_COMMUNITY): Payer: Self-pay | Admitting: Internal Medicine

## 2022-12-17 NOTE — Progress Notes (Signed)
Post Ed Follow-Up    Post ED Follow-Up:   Document completed and/or attempted interactive contact(s) after transition to home after emergency department stay.:   Transition Facility and relevant Date:   Discharge Date: 12/16/22  Discharge from [facility] Emergency Department?: Yes  Discharge Facility: [facility]  Contacted by: Leeanne Rio RN  Contact method: Patient/Caregiver Telephone  Contact completed: 12/17/2022  4:38 PM  MyChart message sent?: No  Was the AVS reviewed with patient?: Yes  How is the patient recovering?: No better or worse  Medications prescribed: Yes  Were they obtained?: Yes  Interventions: Provided patient education, No needs identified  Nurse Navigator toll free number, resources available, and hours of operation provided to patient and/or caregiver.

## 2022-12-18 ENCOUNTER — Inpatient Hospital Stay
Admission: EM | Admit: 2022-12-18 | Discharge: 2022-12-20 | DRG: 640 | Disposition: A | Discharge status: Home / Self Care | Payer: No Typology Code available for payment source | Attending: Pulmonary Disease | Admitting: Pulmonary Disease

## 2022-12-18 ENCOUNTER — Encounter (HOSPITAL_BASED_OUTPATIENT_CLINIC_OR_DEPARTMENT_OTHER): Payer: Self-pay | Admitting: Emergency Medicine

## 2022-12-18 ENCOUNTER — Other Ambulatory Visit: Payer: Self-pay

## 2022-12-18 ENCOUNTER — Inpatient Hospital Stay (HOSPITAL_COMMUNITY): Payer: No Typology Code available for payment source | Admitting: Pulmonary Disease

## 2022-12-18 DIAGNOSIS — M797 Fibromyalgia: Secondary | ICD-10-CM | POA: Diagnosis present

## 2022-12-18 DIAGNOSIS — Z79899 Other long term (current) drug therapy: Secondary | ICD-10-CM

## 2022-12-18 DIAGNOSIS — M353 Polymyalgia rheumatica: Secondary | ICD-10-CM | POA: Diagnosis present

## 2022-12-18 DIAGNOSIS — G8929 Other chronic pain: Secondary | ICD-10-CM | POA: Diagnosis present

## 2022-12-18 DIAGNOSIS — I517 Cardiomegaly: Secondary | ICD-10-CM

## 2022-12-18 DIAGNOSIS — K8689 Other specified diseases of pancreas: Secondary | ICD-10-CM | POA: Diagnosis present

## 2022-12-18 DIAGNOSIS — E871 Hypo-osmolality and hyponatremia: Principal | ICD-10-CM | POA: Diagnosis present

## 2022-12-18 DIAGNOSIS — K5909 Other constipation: Secondary | ICD-10-CM | POA: Diagnosis present

## 2022-12-18 DIAGNOSIS — R112 Nausea with vomiting, unspecified: Secondary | ICD-10-CM | POA: Diagnosis present

## 2022-12-18 DIAGNOSIS — R197 Diarrhea, unspecified: Secondary | ICD-10-CM | POA: Diagnosis present

## 2022-12-18 DIAGNOSIS — R9431 Abnormal electrocardiogram [ECG] [EKG]: Secondary | ICD-10-CM

## 2022-12-18 DIAGNOSIS — Z87891 Personal history of nicotine dependence: Secondary | ICD-10-CM

## 2022-12-18 DIAGNOSIS — K219 Gastro-esophageal reflux disease without esophagitis: Secondary | ICD-10-CM | POA: Diagnosis present

## 2022-12-18 DIAGNOSIS — G9341 Metabolic encephalopathy: Secondary | ICD-10-CM | POA: Diagnosis present

## 2022-12-18 HISTORY — DX: Fibromyalgia: M79.7

## 2022-12-18 HISTORY — DX: Intestinal methanogen overgrowth, unspecified: K63.829

## 2022-12-18 HISTORY — DX: Other specified diseases of pancreas: K86.89

## 2022-12-18 HISTORY — DX: Other chronic pain: G89.29

## 2022-12-18 LAB — CBC WITH DIFF
BASOPHIL #: 0.05 10*3/uL (ref 0.00–0.30)
BASOPHIL %: 0 % (ref 0–3)
EOSINOPHIL #: 0.05 10*3/uL (ref 0.00–0.80)
EOSINOPHIL %: 0 % — ABNORMAL LOW (ref 1–7)
HCT: 46 % (ref 37.0–47.0)
HGB: 16.1 g/dL — ABNORMAL HIGH (ref 12.5–16.0)
LYMPHOCYTE #: 0.67 10*3/uL — ABNORMAL LOW (ref 1.10–5.00)
LYMPHOCYTE %: 6 % — ABNORMAL LOW (ref 25–45)
MCH: 31.6 pg (ref 27.0–32.0)
MCHC: 34.9 g/dL (ref 32.0–36.0)
MCV: 90.3 fL (ref 78.0–99.0)
MONOCYTE #: 1.11 10*3/uL (ref 0.00–1.30)
MONOCYTE %: 10 % (ref 0–12)
MPV: 7.6 fL (ref 7.4–10.4)
NEUTROPHIL #: 9.68 10*3/uL — ABNORMAL HIGH (ref 1.80–8.40)
NEUTROPHIL %: 84 % — ABNORMAL HIGH (ref 40–76)
PLATELETS: 247 10*3/uL (ref 140–440)
RBC: 5.09 10*6/uL (ref 4.20–5.40)
RDW: 16.5 % — ABNORMAL HIGH (ref 11.6–14.8)
WBC: 11.6 10*3/uL — ABNORMAL HIGH (ref 4.0–10.5)

## 2022-12-18 LAB — URINALYSIS, MACRO/MICRO
BILIRUBIN: NEGATIVE mg/dL
GLUCOSE: NEGATIVE mg/dL
KETONES: 40 mg/dL — AB
LEUKOCYTES: NEGATIVE WBCs/uL
NITRITE: NEGATIVE
PH: 6 (ref 4.6–8.0)
SPECIFIC GRAVITY: 1.02 (ref 1.003–1.035)
UROBILINOGEN: 0.2 mg/dL (ref 0.2–1.0)

## 2022-12-18 LAB — BASIC METABOLIC PANEL
ANION GAP: 8 mmol/L (ref 4–13)
ANION GAP: 6 mmol/L (ref 4–13)
BUN/CREA RATIO: 27 — ABNORMAL HIGH (ref 6–22)
BUN/CREA RATIO: 25 — ABNORMAL HIGH (ref 6–22)
BUN: 16 mg/dL (ref 7–25)
BUN: 15 mg/dL (ref 7–25)
CALCIUM: 7.7 mg/dL — ABNORMAL LOW (ref 8.6–10.3)
CALCIUM: 7.9 mg/dL — ABNORMAL LOW (ref 8.6–10.3)
CHLORIDE: 78 mmol/L — ABNORMAL LOW (ref 98–107)
CHLORIDE: 79 mmol/L — ABNORMAL LOW (ref 98–107)
CO2 TOTAL: 26 mmol/L (ref 21–31)
CO2 TOTAL: 27 mmol/L (ref 21–31)
CREATININE: 0.59 mg/dL — ABNORMAL LOW (ref 0.60–1.30)
CREATININE: 0.61 mg/dL (ref 0.60–1.30)
ESTIMATED GFR: 99 mL/min/{1.73_m2} (ref >59)
ESTIMATED GFR: 99 mL/min/{1.73_m2} (ref >59)
GLUCOSE: 104 mg/dL (ref 74–109)
GLUCOSE: 92 mg/dL (ref 74–109)
OSMOLALITY, CALCULATED: 229 mosm/kg — ABNORMAL LOW (ref 270–290)
OSMOLALITY, CALCULATED: 228 mosm/kg — ABNORMAL LOW (ref 270–290)
POTASSIUM: 3.5 mmol/L (ref 3.5–5.1)
POTASSIUM: 3.9 mmol/L (ref 3.5–5.1)
SODIUM: 112 mmol/L — CL (ref 136–145)
SODIUM: 112 mmol/L — CL (ref 136–145)

## 2022-12-18 LAB — COMPREHENSIVE METABOLIC PANEL, NON-FASTING
ALBUMIN/GLOBULIN RATIO: 1.2 (ref 0.8–1.4)
ALBUMIN: 3.9 g/dL (ref 3.4–5.0)
ALKALINE PHOSPHATASE: 117 U/L — ABNORMAL HIGH (ref 46–116)
ALT (SGPT): 264 U/L — ABNORMAL HIGH (ref <=78)
ANION GAP: 9 mmol/L (ref 4–13)
AST (SGOT): 94 U/L — ABNORMAL HIGH (ref 15–37)
BILIRUBIN TOTAL: 2.1 mg/dL — ABNORMAL HIGH (ref 0.2–1.0)
BUN/CREA RATIO: 27
BUN: 20 mg/dL — ABNORMAL HIGH (ref 7–18)
CALCIUM, CORRECTED: 8.9 mg/dL
CALCIUM: 8.8 mg/dL (ref 8.5–10.1)
CHLORIDE: 74 mmol/L — ABNORMAL LOW (ref 98–107)
CO2 TOTAL: 28 mmol/L (ref 21–32)
CREATININE: 0.73 mg/dL (ref 0.55–1.02)
ESTIMATED GFR: 91 mL/min/{1.73_m2} (ref >59)
GLOBULIN: 3.2
GLUCOSE: 131 mg/dL — ABNORMAL HIGH (ref 74–106)
OSMOLALITY, CALCULATED: 230 mosm/kg — ABNORMAL LOW (ref 270–290)
POTASSIUM: 4.9 mmol/L (ref 3.5–5.1)
PROTEIN TOTAL: 7.1 g/dL (ref 6.4–8.2)
SODIUM: 111 mmol/L — CL (ref 136–145)

## 2022-12-18 LAB — ECG 12 LEAD
Atrial Rate: 90 {beats}/min
Calculated P Axis: 85 degrees
Calculated R Axis: 76 degrees
Calculated T Axis: 81 degrees
PR Interval: 170 ms
QRS Duration: 98 ms
QT Interval: 386 ms
QTC Calculation: 472 ms
Ventricular rate: 90 {beats}/min

## 2022-12-18 LAB — MAGNESIUM: MAGNESIUM: 1.8 mg/dL (ref 1.8–2.4)

## 2022-12-18 LAB — URINALYSIS, MICROSCOPIC

## 2022-12-18 LAB — LIPASE: LIPASE: 19 U/L (ref 16–77)

## 2022-12-18 MED ORDER — LIPASE-PROTEASE-AMYLASE 12,000-38,000-60,000 UNIT CAPSULE,DELAYED REL
2.0000 | DELAYED_RELEASE_CAPSULE | Freq: Three times a day (TID) | ORAL | Status: DC
Start: 2022-12-19 — End: 2022-12-20
  Administered 2022-12-19 (×2): 0 via ORAL
  Administered 2022-12-19: 1 via ORAL
  Administered 2022-12-20 (×2): 2 via ORAL
  Filled 2022-12-18 (×2): qty 2
  Filled 2022-12-18: qty 1

## 2022-12-18 MED ORDER — ACETAMINOPHEN 325 MG TABLET
975.0000 mg | ORAL_TABLET | ORAL | Status: AC
Start: 2022-12-18 — End: 2022-12-18
  Administered 2022-12-18: 975 mg via ORAL

## 2022-12-18 MED ORDER — ALUMINUM-MAG HYDROXIDE-SIMETHICONE 200 MG-200 MG-20 MG/5 ML ORAL SUSP
30.0000 mL | ORAL | Status: DC | PRN
Start: 2022-12-18 — End: 2022-12-20

## 2022-12-18 MED ORDER — ESTRADIOL 0.01% (0.1 MG/GRAM) VAGINAL CREAM
TOPICAL_CREAM | VAGINAL | Status: DC
Start: 2022-12-20 — End: 2022-12-20

## 2022-12-18 MED ORDER — ONDANSETRON 4 MG DISINTEGRATING TABLET
ORAL_TABLET | ORAL | Status: AC
Start: 2022-12-18 — End: 2022-12-18
  Filled 2022-12-18: qty 1

## 2022-12-18 MED ORDER — ENOXAPARIN 40 MG/0.4 ML SUBCUTANEOUS SYRINGE
40.0000 mg | INJECTION | SUBCUTANEOUS | Status: DC
Start: 2022-12-18 — End: 2022-12-20
  Administered 2022-12-18 – 2022-12-19 (×2): 40 mg via SUBCUTANEOUS
  Filled 2022-12-18 (×2): qty 0.4

## 2022-12-18 MED ORDER — ACETAMINOPHEN 325 MG TABLET
ORAL_TABLET | ORAL | Status: AC
Start: 2022-12-18 — End: 2022-12-18
  Filled 2022-12-18: qty 3

## 2022-12-18 MED ORDER — ESTRADIOL 0.0375 MG/24 HR SEMIWEEKLY TRANSDERMAL PATCH
0.0375 mg | MEDICATED_PATCH | TRANSDERMAL | Status: DC
Start: 2022-12-19 — End: 2022-12-20
  Administered 2022-12-19: 0 mg via TRANSDERMAL

## 2022-12-18 MED ORDER — LISINOPRIL 5 MG TABLET
5.0000 mg | ORAL_TABLET | Freq: Every day | ORAL | Status: DC
Start: 2022-12-19 — End: 2022-12-20
  Administered 2022-12-19 – 2022-12-20 (×2): 5 mg via ORAL
  Filled 2022-12-18 (×2): qty 1

## 2022-12-18 MED ORDER — BUSPIRONE 5 MG TABLET
5.0000 mg | ORAL_TABLET | Freq: Two times a day (BID) | ORAL | Status: DC
Start: 2022-12-18 — End: 2022-12-20
  Administered 2022-12-18 – 2022-12-20 (×4): 5 mg via ORAL
  Filled 2022-12-18 (×4): qty 1

## 2022-12-18 MED ORDER — ONDANSETRON 8 MG DISINTEGRATING TABLET
ORAL_TABLET | ORAL | Status: AC
Start: 2022-12-18 — End: 2022-12-18
  Filled 2022-12-18: qty 1

## 2022-12-18 MED ORDER — LIFITEGRAST 5 % EYE DROPS IN A DROPPERETTE
1.0000 [drp] | Freq: Two times a day (BID) | OPHTHALMIC | Status: DC
Start: 2022-12-18 — End: 2022-12-20
  Administered 2022-12-18 – 2022-12-20 (×4): 0 [drp] via OPHTHALMIC

## 2022-12-18 MED ORDER — CELECOXIB 100 MG CAPSULE
200.0000 mg | ORAL_CAPSULE | Freq: Two times a day (BID) | ORAL | Status: DC
Start: 2022-12-18 — End: 2022-12-20
  Administered 2022-12-18 – 2022-12-20 (×4): 200 mg via ORAL
  Filled 2022-12-18 (×4): qty 2

## 2022-12-18 MED ORDER — ATORVASTATIN 10 MG TABLET
10.0000 mg | ORAL_TABLET | Freq: Every day | ORAL | Status: DC
Start: 2022-12-19 — End: 2022-12-20
  Administered 2022-12-19 – 2022-12-20 (×2): 10 mg via ORAL
  Filled 2022-12-18 (×2): qty 1

## 2022-12-18 MED ORDER — SODIUM CHLORIDE 3 % HYPERTONIC INTRAVENOUS INJECTION SOLUTION
30.0000 mL/h | INTRAVENOUS | Status: AC
Start: 2022-12-18 — End: 2022-12-19
  Administered 2022-12-18: 25 mL/h via INTRAVENOUS
  Administered 2022-12-19: 30 mL/h via INTRAVENOUS
  Filled 2022-12-18: qty 500

## 2022-12-18 MED ORDER — PROGESTERONE MICRONIZED 100 MG CAPSULE
100.0000 mg | ORAL_CAPSULE | Freq: Every evening | ORAL | Status: DC
Start: 2022-12-18 — End: 2022-12-19
  Administered 2022-12-18: 0 mg via ORAL

## 2022-12-18 MED ORDER — SODIUM CHLORIDE 0.9 % (FLUSH) INJECTION SYRINGE
3.0000 mL | INJECTION | INTRAMUSCULAR | Status: DC | PRN
Start: 2022-12-18 — End: 2022-12-20

## 2022-12-18 MED ORDER — SODIUM CHLORIDE 0.9 % (FLUSH) INJECTION SYRINGE
3.0000 mL | INJECTION | Freq: Three times a day (TID) | INTRAMUSCULAR | Status: DC
Start: 2022-12-18 — End: 2022-12-20
  Administered 2022-12-18 – 2022-12-19 (×4): 3 mL
  Administered 2022-12-20: 0 mL
  Administered 2022-12-20: 3 mL

## 2022-12-18 MED ORDER — NALTREXONE 4.5 MG CAPSULE
1.0000 | ORAL_CAPSULE | Freq: Every day | ORAL | Status: DC
Start: 2022-12-19 — End: 2022-12-20
  Administered 2022-12-19 – 2022-12-20 (×2): 0 mg via ORAL

## 2022-12-18 MED ORDER — SODIUM CHLORIDE 0.9 % IV BOLUS
1000.0000 mL | INJECTION | Status: AC
Start: 2022-12-18 — End: 2022-12-18
  Administered 2022-12-18: 1000 mL via INTRAVENOUS
  Administered 2022-12-18: 0 mL via INTRAVENOUS

## 2022-12-18 MED ORDER — ONDANSETRON HCL (PF) 4 MG/2 ML INJECTION SOLUTION
4.0000 mg | Freq: Four times a day (QID) | INTRAMUSCULAR | Status: DC | PRN
Start: 2022-12-18 — End: 2022-12-20

## 2022-12-18 MED ORDER — ONDANSETRON 8 MG DISINTEGRATING TABLET
8.0000 mg | ORAL_TABLET | ORAL | Status: AC
Start: 2022-12-18 — End: 2022-12-18
  Administered 2022-12-18: 8 mg via ORAL

## 2022-12-18 MED ORDER — BUPROPION HCL SR 150 MG TABLET,12 HR SUSTAINED-RELEASE
150.0000 mg | ORAL_TABLET | Freq: Two times a day (BID) | ORAL | Status: DC
Start: 2022-12-19 — End: 2022-12-20
  Administered 2022-12-19: 150 mg via ORAL
  Administered 2022-12-19: 0 mg via ORAL
  Administered 2022-12-20 (×2): 150 mg via ORAL
  Filled 2022-12-18 (×4): qty 1

## 2022-12-18 MED ORDER — ACETAMINOPHEN 325 MG TABLET
650.0000 mg | ORAL_TABLET | ORAL | Status: DC | PRN
Start: 2022-12-18 — End: 2022-12-20
  Administered 2022-12-18 – 2022-12-20 (×4): 650 mg via ORAL
  Filled 2022-12-18 (×4): qty 2

## 2022-12-18 NOTE — ED Triage Notes (Signed)
Was here yesterday afternoon  back today still dizzy  lightheaded  diarrhea  nauseated  no vomiting cramps in legs  no appetite

## 2022-12-18 NOTE — ED Nurses Note (Signed)
Patient c/o headache. Provider notified.

## 2022-12-18 NOTE — ED Nurses Note (Signed)
Patient resting in bed with eyes closed and respirations WDL. No signs of respiratory distress noted. Chest rise and fall equal.

## 2022-12-18 NOTE — ED Nurses Note (Signed)
Patient tolerating po fluids, ice chips and applesauce. Provider notified.

## 2022-12-18 NOTE — H&P (Signed)
[facility] Sonya Parks    HOSPITALIST H&P    Sonya Parks 67 y.o. female [id number]   Date of Service: 12/18/2022    Date of Admission:  12/18/2022   PCP: Samuella Cota, PA-C Code Status:FULL CODE: ATTEMPT RESUSCITATION/CPR       Chief Complaint:  " recent nausea, vomiting, and diarrhea, dizziness, lightheadedness, muscle cramps in calves, headache, excessive thirst "    HPI:   Sonya Parks is a 67 year old female who presented to the [facility] ER on 12/18/22 with complaints of feeling light-headed, dizzy, headache, nausea, and cramping in bilateral calves.  Patient stated that she was seen in ER 2 days prior for low sodium of 126.  She was given IVF and told to follow up with PCP.  She went to see PCP today, but they were too busy to work her in, so she came to the ER since she still felt poorly.  Patient stated that she had gastroenteritis about 3-4 days ago.  She had projectile vomiting and multiple Bms (14 in one day).  She has been unable to eat very much, but has been drinking a lot and reports having a dry mouth.  She denied having any chest pain, shortness of breath, fever, chills, body aches.  She states that she was started on Cymbalta and took for two days only on Thursday and Friday.  She was prescribed the Cymbalta due to having chronic pain, fibromyalgia, arthritis symptoms.  Labs upon arrival to ER were as follows:  WBC 11.6, Hgb 16.1, sodium 111, chloride 74, BUN 20, creatinine 0.73, glucose 131, total bili 2.1, AST 94, ALT 264, Alk phos 117.  UA showing cloudy urine with 40 ketones, small blood, trace protein.  The patient was accepted for inpatient admission from [facility] ER to [facility] CCU due to low sodium.  The patient was seen and examined for hospitalist admission after arrival to CCU.          ED medications:   Medications Administered in the ED   ondansetron (ZOFRAN ODT) rapid dissolve tablet (8 mg Oral Given 12/18/22 1434)   NS bolus infusion 1,000 mL (1,000 mL Intravenous New  Bag/New Syringe 12/18/22 1509)         PMHx:    Past Medical History:   Diagnosis Date    Chronic pain     Degenerative arthritis     Esophageal reflux     Fatigue 10/09/2021    Fibromyalgia     GAD (generalized anxiety disorder)     Gait disturbance     H/O endoscopy     x3 last one june 2021 dr Karna Christmas    Hyponatremia     IBS (irritable bowel syndrome)     Intestinal methanogen overgrowth     Mixed hyperlipidemia     Osteoarthritis     Other specified disorders of temporomandibular joint     Pancreatic insufficiency     Polymyalgia rheumatica (CMS HCC)     S/P gastrointestinal surgery     sphincter of oddi 2019    Vitamin D deficiency     Weakness         PSHx:   Past Surgical History:   Procedure Laterality Date    ADENOIDECTOMY      BREAST SURGERY      2008 implants removed july 2019 dr grubb    COLONOSCOPY      2010 june  2021  dr Karna Christmas    DENTAL SURGERY  teeth implants    HX APPENDECTOMY      HX CATARACT REMOVAL Bilateral     HX CHOLECYSTECTOMY      HX HIP REPLACEMENT      HX TONSILLECTOMY      HX TUBAL LIGATION Bilateral     SINUS SURGERY      implants and sinus life with bone graft          Allergies:    Allergies   Allergen Reactions    Bee Venom Protein (Honey Bee) Swelling     hives    Metoprolol  Other Adverse Reaction (Add comment)     Severe fatigue    Penicillins Rash    Berberine      N/v    Garlic     Oregano Oil      N/v     Poison Ivy Extract Hives/ Urticaria    Social History  Social History     Tobacco Use    Smoking status: Former     Types: Cigarettes    Smokeless tobacco: Never   Vaping Use    Vaping status: Never Used   Substance Use Topics    Alcohol use: Not Currently    Drug use: Never       Family History  Family Medical History:       Problem Relation (Age of Onset)    Arthritis-osteo Mother    Back Problems Father    Coronary Artery Disease Mother    Hypertension (High Blood Pressure) Mother    Lung disease Father               Home Meds:      Prior to Admission medications     Medication Sig Start Date End Date Taking? Authorizing Provider   atorvastatin (LIPITOR) 10 mg Oral Tablet Take 1 Tablet (10 mg total) by mouth Once a day 06/06/22  Yes Matzel, Ranae Plumber, PA-C   buPROPion (WELLBUTRIN XL) 300 mg extended release 24 hr tablet TAKE 1 TABLET BY MOUTH EVERY MORNING 08/16/22  Yes Matzel, Kimberly A, PA-C   busPIRone (BUSPAR) 15 mg Oral Tablet Take 1 Tablet (15 mg total) by mouth Twice daily  Patient taking differently: Take 0.3333 Tablets (5 mg total) by mouth Twice daily 10/16/22  Yes Matzel, Kimberly A, PA-C   celecoxib (CELEBREX) 200 mg Oral Capsule TAKE 1 CAPSULE BY MOUTH TWICE DAILY 07/18/22  Yes Matzel, Kimberly A, PA-C   ciclopirox (PENLAC) 8 % Solution Apply to affected nails daily; Wipe off every 7 days with alcohol and start daily process again  Patient not taking: Reported on 12/18/2022 07/07/21      estradioL (ESTRACE) 0.01 % (0.1 mg/gram) Vaginal Cream Apply a pea-sized amount to urethral opening every night for 1 month.  Then reduce to Monday-Wednesday-Friday dosing as directed 08/23/22   Addair, [place], PA-C   estradioL (VIVELLE-DOT) 0.0375 mg/24 hr Transdermal Patch Semiweekly APPLY 1 PATCH TOPICALLY TO CLEAN, DRY, AND HAIR-FREE AREA OF THE LOWER STOMACH OR UPPER BUTTOCK AREA TWICE A WEEK (TUESDAY & FRIDAY) 12/05/22      lifitegrast (XIIDRA) 5 % Ophthalmic Dropperette Use 1 drop in both eyes twice daily 03/16/21  Yes    lipase-protease-amylase (CREON) 24,000 units of lipase Oral Capsule, Delayed Release(E.C.) Take 1 capsule (24,000 units of lipase total) by mouth in the morning and 1 capsule at noon and 1 capsule in the evening. Take with meals. 06/08/22  Yes    lisinopriL (PRINIVIL) 5 mg Oral Tablet Take  1 Tablet (5 mg total) by mouth Once a day for 30 days 12/16/22 01/15/23 Yes Adams, Amy, FNP, ENP-C   naltrexone 4.5 mg Oral Capsule Take 1 Capsule (4.5 mg total) by mouth Once a day   Yes Provider, Historical   pantoprazole (PROTONIX) 20 mg Oral Tablet, Delayed Release (E.C.) TAKE  1 TABLET BY MOUTH ONCE DAILY 12/05/22  Yes Matzel, Kimberly A, PA-C   progesterone micronized (PROMETRIUM) 100 mg Oral Capsule TAKE 1 CAPSULE BY MOUTH ONCE DAILY AT BEDTIME ON DAYS 1-25 OF CALENDAR MONTH 12/13/20      progesterone micronized (PROMETRIUM) 100 mg Oral Capsule Take 1 capsule (100mg  total) by mouth at bedtime on days 1-25 of calendar month  Patient not taking: Reported on 12/18/2022 07/18/22      vonoprazan (VOQUEZNA) 20 mg Oral Tablet Take 1 Tablet (20 mg total) by mouth Once a day  Patient not taking: Reported on 12/18/2022 12/05/22   Jeral Pinch A, PA-C   ciclopirox (PENLAC) 8 % Solution Apply to affected nails daily; Wipe off every 7 days with alcohol and start daily process again 04/21/21 12/07/22     dicyclomine (BENTYL) 10 mg Oral Capsule Take 1 Capsule (10 mg total) by mouth Four times a day  Patient not taking: Reported on 12/18/2022 12/20/21 12/18/22  Samuella Cota, PA-C   DULoxetine (CYMBALTA DR) 30 mg Oral Capsule, Delayed Release(E.C.) Take 1 Capsule (30 mg total) by mouth Twice daily 12/05/22 12/18/22  Samuella Cota, PA-C   estradioL (VIVELLE-DOT) 0.0375 mg/24 hr Transdermal Patch Semiweekly APPLY 1 PATCH TOPICALLY TO CLEAN, DRY, AND HAIR-FREE AREA OF THE LOWER STOMACH OR UPPER BUTTOCK AREA TWICE A WEEK (TUESDAY & FRIDAY) 07/18/22 12/05/22     itraconazole (SPORONAX) 100 mg Oral Capsule Take one tablet by mouth twice daily - Stop Erythromycin decrease Estrogen to 1/2 patch twice weekly Avoid PPIs like Omeprazole 05/14/21 12/07/22     Lovastatin (MEVACOR) 10 mg Oral Tablet Take 1 Tablet (10 mg total) by mouth Once a day  Patient not taking: Reported on 05/15/2022 01/04/22 12/07/22  Jeral Pinch A, PA-C   neomycin 500 mg Oral Tablet Take 1 tablet (500 mg total) by mouth 3 (three) times a day. 08/15/22 12/18/22     pancreatic enzyme replacement (CREON) 12,000-38,000 -60,000 unit Oral Capsule, Delayed Release(E.C.) Take 2 capsules (24,000 units of lipase total) by mouth 3 times daily with  meals. 11/23/21 12/18/22     pantoprazole (PROTONIX) 20 mg Oral Tablet, Delayed Release (E.C.) TAKE 1 TABLET BY MOUTH ONCE DAILY 05/15/22 12/05/22  Samuella Cota, PA-C   rifAXIMin (XIFAXAN) 550 mg Oral Tablet Take 1 Tablet (550 mg total) by mouth Twice daily 08/02/22 12/18/22  Samuella Cota, PA-C   rifAXIMin (XIFAXAN) 550 mg Oral Tablet Take 1 tablet (550 mg total) by mouth 3 (three) times a day. 08/15/22 12/18/22            ROS:   ROS completed and was negative except for what was mentioned in HPI.        Results for orders placed or performed during the Parks encounter of 12/18/22 (from the past 24 hour(s))   ECG 12 LEAD   Result Value Ref Range    Ventricular rate 90 BPM    Atrial Rate 90 BPM    PR Interval 170 ms    QRS Duration 98 ms    QT Interval 386 ms    QTC Calculation 472 ms    Calculated P Axis 85 degrees  Calculated R Axis 76 degrees    Calculated T Axis 81 degrees   COMPREHENSIVE METABOLIC PANEL, NON-FASTING   Result Value Ref Range    SODIUM 111 (LL) 136 - 145 mmol/L    POTASSIUM 4.9 3.5 - 5.1 mmol/L    CHLORIDE 74 (L) 98 - 107 mmol/L    CO2 TOTAL 28 21 - 32 mmol/L    ANION GAP 9 4 - 13 mmol/L    BUN 20 (H) 7 - 18 mg/dL    CREATININE 5.28 4.13 - 1.02 mg/dL    BUN/CREA RATIO 27     ESTIMATED GFR 91 >59 mL/min/1.105m^2    ALBUMIN 3.9 3.4 - 5.0 g/dL    CALCIUM 8.8 8.5 - 24.4 mg/dL    GLUCOSE 010 (H) 74 - 106 mg/dL    ALKALINE PHOSPHATASE 117 (H) 46 - 116 U/L    ALT (SGPT) 264 (H) <=78 U/L    AST (SGOT) 94 (H) 15 - 37 U/L    BILIRUBIN TOTAL 2.1 (H) 0.2 - 1.0 mg/dL    PROTEIN TOTAL 7.1 6.4 - 8.2 g/dL    ALBUMIN/GLOBULIN RATIO 1.2 0.8 - 1.4    OSMOLALITY, CALCULATED 230 (L) 270 - 290 mOsm/kg    CALCIUM, CORRECTED 8.9 mg/dL    GLOBULIN 3.2    LIPASE   Result Value Ref Range    LIPASE 19 16 - 77 U/L   MAGNESIUM   Result Value Ref Range    MAGNESIUM 1.8 1.8 - 2.4 mg/dL   CBC WITH DIFF   Result Value Ref Range    WBC 11.6 (H) 4.0 - 10.5 x10^3/uL    RBC 5.09 4.20 - 5.40 x10^6/uL    HGB 16.1 (H) 12.5 - 16.0  g/dL    HCT 27.2 53.6 - 64.4 %    MCV 90.3 78.0 - 99.0 fL    MCH 31.6 27.0 - 32.0 pg    MCHC 34.9 32.0 - 36.0 g/dL    RDW 03.4 (H) 74.2 - 14.8 %    PLATELETS 247 140 - 440 x10^3/uL    MPV 7.6 7.4 - 10.4 fL    NEUTROPHIL % 84 (H) 40 - 76 %    LYMPHOCYTE % 6 (L) 25 - 45 %    MONOCYTE % 10 0 - 12 %    EOSINOPHIL % 0 (L) 1 - 7 %    BASOPHIL % 0 0 - 3 %    NEUTROPHIL # 9.68 (H) 1.80 - 8.40 x10^3/uL    LYMPHOCYTE # 0.67 (L) 1.10 - 5.00 x10^3/uL    MONOCYTE # 1.11 0.00 - 1.30 x10^3/uL    EOSINOPHIL # 0.05 0.00 - 0.80 x10^3/uL    BASOPHIL # 0.05 0.00 - 0.30 x10^3/uL   URINALYSIS, MACRO/MICRO   Result Value Ref Range    COLOR Yellow Light Yellow, Yellow    APPEARANCE Slightly Cloudy (A) Clear    SPECIFIC GRAVITY 1.020 1.003 - 1.035    PH 6.0 4.6 - 8.0    LEUKOCYTES Negative Negative WBCs/uL    NITRITE Negative Negative    PROTEIN Trace (A) Negative mg/dL    GLUCOSE Negative Negative mg/dL    KETONES 40 (A) Negative mg/dL    BILIRUBIN Negative Negative mg/dL    BLOOD Small (A) Negative mg/dL    UROBILINOGEN 0.2 0.2 - 1.0 mg/dL   URINALYSIS, MICROSCOPIC   Result Value Ref Range    RBCS 0-3 0-3, Not Present /hpf    BACTERIA Few (A) Negative /hpf  MUCOUS Few Rare, Occasional, Few /hpf    WBCS 6-10 (A) Not Present, Occasional, 0-5 /hpf    SQUAMOUS EPITHELIAL Several (A) Not Present, Few /hpf   BASIC METABOLIC PANEL   Result Value Ref Range    SODIUM 112 (LL) 136 - 145 mmol/L    POTASSIUM 3.5 3.5 - 5.1 mmol/L    CHLORIDE 78 (L) 98 - 107 mmol/L    CO2 TOTAL 26 21 - 31 mmol/L    ANION GAP 8 4 - 13 mmol/L    CALCIUM 7.7 (L) 8.6 - 10.3 mg/dL    GLUCOSE 638 74 - 756 mg/dL    BUN 16 7 - 25 mg/dL    CREATININE 4.33 (L) 0.60 - 1.30 mg/dL    BUN/CREA RATIO 27 (H) 6 - 22    ESTIMATED GFR 99 >59 mL/min/1.72m^2    OSMOLALITY, CALCULATED 229 (L) 270 - 290 mOsm/kg   BASIC METABOLIC PANEL   Result Value Ref Range    SODIUM 112 (LL) 136 - 145 mmol/L    POTASSIUM 3.9 3.5 - 5.1 mmol/L    CHLORIDE 79 (L) 98 - 107 mmol/L    CO2 TOTAL 27 21 - 31  mmol/L    ANION GAP 6 4 - 13 mmol/L    CALCIUM 7.9 (L) 8.6 - 10.3 mg/dL    GLUCOSE 92 74 - 295 mg/dL    BUN 15 7 - 25 mg/dL    CREATININE 1.88 4.16 - 1.30 mg/dL    BUN/CREA RATIO 25 (H) 6 - 22    ESTIMATED GFR 99 >59 mL/min/1.106m^2    OSMOLALITY, CALCULATED 228 (L) 270 - 290 mOsm/kg          Physical:  Filed Vitals:    12/18/22 2000 12/18/22 2100 12/18/22 2200 12/18/22 2300   BP: (!) 142/73 136/77 135/76 128/74   Pulse: 79 80 75 72   Resp: 20 13 14 12    Temp:       SpO2: 99% 99% 96% 96%      General: Patient is alert and oriented to person, place, and time. No acute distress. Communicates appropriately.   Head: Normocephalic and atraumatic.    Eyes: Pupils equally round and react to light and accommodate. Extraocular movements intact.  Conjunctiva normal. Sclerae are normal.    Heart: Regular rate and rhythm. S1 & S2 present. No S3 or S4. No rubs, gallops, or murmurs appreciated.  Radial and dorsalis pedis pulses +2/4 bilaterally.  Brisk capillary refill.    Lungs: Clear to auscultation bilaterally with no wheezes or rales. Equal chest excursion.  No conversational dyspnea. No respiratory distress noted.   Abdomen: Soft, mildly tender, mildly distended belly--improved per patient. Bowel sounds are present in all four quadrants. No rigidity.  No guarding.  No ascites.   Extremities: No edema, cyanosis, or clubbing. Grossly moves all extremities.    Skin: Warm and dry without lesions. No ecchymosis noted.    Neurologic: Cranial nerves II through XII are grossly intact. Sensation to light touch is intact. Strength 5/5 in upper extremities and lower extremities bilaterally.    Genitourinary:  No urinary incontinence or Foley catheter   Psychiatric: Judgment and insight are intact. Mood and affect are appropriate for the situation.           Assessments:  Active Parks Problems   (*Primary Problem)    Diagnosis    *Acute hyponatremia    Nausea vomiting and diarrhea    Acute metabolic encephalopathy     Acute  hyponatremia  Sodium low at 111 at time of arrival to ER.  Sodium has only improved to 112 at this time.  Patient placed on 3% saline @ 25 ml/hr.  BMPL ordered q2 hours.  Repeat labs ordered for am.      2.  Acute metabolic encephalopathy   Patient continues to report headache, dizziness, lightheadedness, mental fogginess that is worse than her baseline.  Neuro checks ordered Q4 hours.      3.  Nausea, vomiting, diarrhea   Resolved at this time.  Patient states that she still has not been able to eat, but has been drinking excessively since this episode of illness. Patient ordered Zofran prn.  Patient started on clear liquid diet.  Patient placed on 1500 ml/day fluid restriction.      4.  Pancreatic enzyme insufficiency       GERD   Patient takes Creon, Protonix at home.  Medications continued.      5.  Chronic constipation   Patient states that she normally has chronic constipation, but has been holding laxatives and stool softeners due to excessive diarrhea.  Will continue to hold these medications at this time.      Plan:  Patient will be admitted for the above problems.  The patient will be monitored in CCU.  Labs ordered for am.  Home medications restarted as appropriate.  Further orders will depend upon clinical course.  The Hospitalist personally evaluated and examined the patient in conjunction with the MLP and agree with the assessments, treatment plan and disposition of the patient as recorded by the [place] [facility].      Code status: FULL CODE: ATTEMPT RESUSCITATION/CPR  DVT prophylaxis: Lovenox    Diet: DIET CLEAR LIQUID Do you want to initiate MNT Protocol? Yes; Restrict fluids to: 1500 ML    Disposition:  The patient is currently acutely ill requiring treatment in CCU for inpatient admission. Patient will be closely evaluated monitor and remove be adjusted accordingly.  Estimated length of stay greater than 48 hr to obtain full medical treatment.      Stanford Breed, FNP-BC    [facility] MEDICINE HOSPITALIST

## 2022-12-18 NOTE — ED Nurses Note (Signed)
[facility] here to transport patient to [facility] at this time.

## 2022-12-18 NOTE — ED Nurses Note (Signed)
Report given to Rashia, RN at CCU [facility]. Awaiting transport at this time.

## 2022-12-18 NOTE — ED Provider Notes (Signed)
Emergency Department  [facility]   12/18/2022     Sonya Parks  10/01/1955  67 y.o.  female  [place] [address]   [phone] (home)  PCP: Samuella Cota, PA-C   [id number]    Date of service:12/18/2022 14:20    Chief Complaint:   Chief Complaint   Patient presents with    Abdominal Pain       Arrival: The patient arrived by Car and is alone    HPI: This is a 67 year old female who presents to the emergency room with a complaint of abdominal pain, with nausea, no vomiting..  Patient states that the symptoms are intermittent.  Patient has a past medical history of gastroesophageal reflux disease, generalized anxiety disorder, last endoscope in June of 2021, hyponatremia, irritable bowel, E coli, rheumatoid, fibromyalgia, gastrointestinal surgery in 2019, according to chart review looks like patient has seen [facility] in the past.  Patient was seen yesterday for similar symptoms, found to be sodium of 126 at that time, educated to hydrate, went home and came back and has not hydrated.  States that she can drink but does not feel a need to, states that she has not had much of an appetite, Patient denies headache, denies blurred vision, denies fever or chills.  Patient denies chest pain, denies shortness of breath.  Patient denies vomiting, denies diarrhea, denies any changes in bowel or bladder habits.   Patient appears nontoxic with no signs and symptoms of respiratory or cardiovascular distress at bedside.     Past Medical History:   Past Medical History:   Diagnosis Date    Degenerative arthritis     Esophageal reflux     Fatigue 10/09/2021    GAD (generalized anxiety disorder)     Gait disturbance     H/O endoscopy     x3 last one june 2021 dr Karna Christmas    Hyponatremia     IBS (irritable bowel syndrome)     Mixed hyperlipidemia     Osteoarthritis     Other specified disorders of temporomandibular joint     Polymyalgia rheumatica (CMS HCC)     S/P gastrointestinal surgery      sphincter of oddi 2019    Vitamin D deficiency     Weakness        Past Surgical History:   Past Surgical History:   Procedure Laterality Date    Breast surgery      Colonoscopy      Dental surgery      Hx appendectomy      Hx cataract removal      Hx cholecystectomy      Hx hip replacement      Hx tonsillectomy      Sinus surgery         Social History:   Social History     Tobacco Use    Smoking status: Former     Types: Cigarettes    Smokeless tobacco: Never   Vaping Use    Vaping status: Never Used   Substance Use Topics    Alcohol use: Not Currently    Drug use: Never        Family History:  Family History   Problem Relation Age of Onset    No Known Problems Mother     No Known Problems Father     No Known Problems Sister     No Known Problems Brother     No Known Problems  Maternal Grandmother     No Known Problems Maternal Grandfather     No Known Problems Paternal Grandmother     No Known Problems Paternal Grandfather     No Known Problems Daughter     No Known Problems Son     No Known Problems Maternal Aunt     No Known Problems Maternal Uncle     No Known Problems Paternal Aunt     No Known Problems Paternal Uncle     No Known Problems Other            Medications Prior to Admission       Prescriptions    atorvastatin (LIPITOR) 10 mg Oral Tablet    Take 1 Tablet (10 mg total) by mouth Once a day    buPROPion (WELLBUTRIN XL) 300 mg extended release 24 hr tablet    TAKE 1 TABLET BY MOUTH EVERY MORNING    busPIRone (BUSPAR) 15 mg Oral Tablet    Take 1 Tablet (15 mg total) by mouth Twice daily    celecoxib (CELEBREX) 200 mg Oral Capsule    TAKE 1 CAPSULE BY MOUTH TWICE DAILY    ciclopirox (PENLAC) 8 % Solution    Apply to affected nails daily; Wipe off every 7 days with alcohol and start daily process again    dicyclomine (BENTYL) 10 mg Oral Capsule    Take 1 Capsule (10 mg total) by mouth Four times a day    estradioL (ESTRACE) 0.01 % (0.1 mg/gram) Vaginal Cream    Apply a pea-sized amount to urethral opening  every night for 1 month.  Then reduce to Monday-Wednesday-Friday dosing as directed    estradioL (VIVELLE-DOT) 0.0375 mg/24 hr Transdermal Patch Semiweekly    APPLY 1 PATCH TOPICALLY TO CLEAN, DRY, AND HAIR-FREE AREA OF THE LOWER STOMACH OR UPPER BUTTOCK AREA TWICE A WEEK (TUESDAY & FRIDAY)    lifitegrast (XIIDRA) 5 % Ophthalmic Dropperette    Use 1 drop in both eyes twice daily    lipase-protease-amylase (CREON) 24,000 units of lipase Oral Capsule, Delayed Release(E.C.)    Take 1 capsule (24,000 units of lipase total) by mouth in the morning and 1 capsule at noon and 1 capsule in the evening. Take with meals.    lisinopriL (PRINIVIL) 5 mg Oral Tablet    Take 1 Tablet (5 mg total) by mouth Once a day for 30 days    naltrexone 4.5 mg Oral Capsule    Take 1 Capsule (4.5 mg total) by mouth Once a day    pancreatic enzyme replacement (CREON) 12,000-38,000 -60,000 unit Oral Capsule, Delayed Release(E.C.)    Take 2 capsules (24,000 units of lipase total) by mouth 3 times daily with meals.    pantoprazole (PROTONIX) 20 mg Oral Tablet, Delayed Release (E.C.)    TAKE 1 TABLET BY MOUTH ONCE DAILY    progesterone micronized (PROMETRIUM) 100 mg Oral Capsule    TAKE 1 CAPSULE BY MOUTH ONCE DAILY AT BEDTIME ON DAYS 1-25 OF CALENDAR MONTH    progesterone micronized (PROMETRIUM) 100 mg Oral Capsule    Take 1 capsule (100mg  total) by mouth at bedtime on days 1-25 of calendar month    vonoprazan (VOQUEZNA) 20 mg Oral Tablet    Take 1 Tablet (20 mg total) by mouth Once a day            Allergies:   Allergies   Allergen Reactions    Bee Venom Protein (Honey Bee) Swelling     hives  Metoprolol  Other Adverse Reaction (Add comment)     Severe fatigue    Penicillins Rash    Berberine      N/v    Garlic     Oregano Oil      N/v     Poison Ivy Extract Hives/ Urticaria       Above history reviewed with patient.  Allergies, medication list, reviewed.        Physical exam:  Body mass index is 21.79 kg/m.  ED Triage Vitals [12/18/22 1320]    BP (Non-Invasive) (!) 141/76   Heart Rate 96   Respiratory Rate 18   Temperature 37 C (98.6 F)   SpO2 99 %   Weight 61.2 kg (135 lb)   Height 1.676 m (5\' 6" )     Constitutional: patient is oriented to person, place, and time and well-developed, well-nourished, and in no distress.   HENT:   Head: Normocephalic and atraumatic.   Right Ear: External ear normal.   Left Ear: External ear normal.   Nose: Nose normal.   Mouth/Throat: Oropharynx is clear and moist.   Eyes: Pupils are equal, round, and reactive to light. Conjunctivae and EOM are normal.   Neck: Normal range of motion. Neck supple.   Cardiovascular: Normal rate, regular rhythm, normal heart sounds and intact distal pulses.   Pulmonary/Chest: Effort normal and breath sounds normal.   Abdominal: Soft. Bowel sounds are normal.   Musculoskeletal: Normal range of motion.   Neurological:Patient is alert and oriented to person, place, and time.  Gait normal. GCS score is 15.   Skin: Skin is warm and dry.   Psychiatric: Mood, memory, affect and judgment normal.    The following orders were placed after examining the patient :  Orders Placed This Encounter    CBC/DIFF    COMPREHENSIVE METABOLIC PANEL, NON-FASTING    LIPASE    MAGNESIUM    URINALYSIS WITH REFLEX MICROSCOPIC AND CULTURE IF POSITIVE    CBC WITH DIFF    URINALYSIS, MACRO/MICRO    URINALYSIS, MICROSCOPIC    ECG 12 LEAD    PATIENT CLASS/LEVEL OF CARE DESIGNATION - PRN    ondansetron (ZOFRAN ODT) rapid dissolve tablet    NS bolus infusion 1,000 mL      No orders to display      Results for orders placed or performed during the hospital encounter of 12/18/22 (from the past 12 hour(s))   COMPREHENSIVE METABOLIC PANEL, NON-FASTING   Result Value Ref Range    SODIUM 111 (LL) 136 - 145 mmol/L    POTASSIUM 4.9 3.5 - 5.1 mmol/L    CHLORIDE 74 (L) 98 - 107 mmol/L    CO2 TOTAL 28 21 - 32 mmol/L    ANION GAP 9 4 - 13 mmol/L    BUN 20 (H) 7 - 18 mg/dL    CREATININE 6.28 3.15 - 1.02 mg/dL    BUN/CREA RATIO 27      ESTIMATED GFR 91 >59 mL/min/1.77m^2    ALBUMIN 3.9 3.4 - 5.0 g/dL    CALCIUM 8.8 8.5 - 17.6 mg/dL    GLUCOSE 160 (H) 74 - 106 mg/dL    ALKALINE PHOSPHATASE 117 (H) 46 - 116 U/L    ALT (SGPT) 264 (H) <=78 U/L    AST (SGOT) 94 (H) 15 - 37 U/L    BILIRUBIN TOTAL 2.1 (H) 0.2 - 1.0 mg/dL    PROTEIN TOTAL 7.1 6.4 - 8.2 g/dL    ALBUMIN/GLOBULIN RATIO 1.2 0.8 -  1.4    OSMOLALITY, CALCULATED 230 (L) 270 - 290 mOsm/kg    CALCIUM, CORRECTED 8.9 mg/dL    GLOBULIN 3.2    LIPASE   Result Value Ref Range    LIPASE 19 16 - 77 U/L   MAGNESIUM   Result Value Ref Range    MAGNESIUM 1.8 1.8 - 2.4 mg/dL   CBC WITH DIFF   Result Value Ref Range    WBC 11.6 (H) 4.0 - 10.5 x10^3/uL    RBC 5.09 4.20 - 5.40 x10^6/uL    HGB 16.1 (H) 12.5 - 16.0 g/dL    HCT 95.6 38.7 - 56.4 %    MCV 90.3 78.0 - 99.0 fL    MCH 31.6 27.0 - 32.0 pg    MCHC 34.9 32.0 - 36.0 g/dL    RDW 33.2 (H) 95.1 - 14.8 %    PLATELETS 247 140 - 440 x10^3/uL    MPV 7.6 7.4 - 10.4 fL    NEUTROPHIL % 84 (H) 40 - 76 %    LYMPHOCYTE % 6 (L) 25 - 45 %    MONOCYTE % 10 0 - 12 %    EOSINOPHIL % 0 (L) 1 - 7 %    BASOPHIL % 0 0 - 3 %    NEUTROPHIL # 9.68 (H) 1.80 - 8.40 x10^3/uL    LYMPHOCYTE # 0.67 (L) 1.10 - 5.00 x10^3/uL    MONOCYTE # 1.11 0.00 - 1.30 x10^3/uL    EOSINOPHIL # 0.05 0.00 - 0.80 x10^3/uL    BASOPHIL # 0.05 0.00 - 0.30 x10^3/uL   URINALYSIS, MACRO/MICRO   Result Value Ref Range    COLOR Yellow Light Yellow, Yellow    APPEARANCE Slightly Cloudy (A) Clear    SPECIFIC GRAVITY 1.020 1.003 - 1.035    PH 6.0 4.6 - 8.0    LEUKOCYTES Negative Negative WBCs/uL    NITRITE Negative Negative    PROTEIN Trace (A) Negative mg/dL    GLUCOSE Negative Negative mg/dL    KETONES 40 (A) Negative mg/dL    BILIRUBIN Negative Negative mg/dL    BLOOD Small (A) Negative mg/dL    UROBILINOGEN 0.2 0.2 - 1.0 mg/dL   URINALYSIS, MICROSCOPIC   Result Value Ref Range    RBCS 0-3 0-3, Not Present /hpf    BACTERIA Few (A) Negative /hpf    MUCOUS Few Rare, Occasional, Few /hpf    WBCS 6-10 (A) Not  Present, Occasional, 0-5 /hpf    SQUAMOUS EPITHELIAL Several (A) Not Present, Few /hpf         ED Course:   ED Course as of 12/18/22 1553   Mon Dec 18, 2022   1545 CBC/DIFF(!)  Slight leukocytosis of 11.6, slight shift noted   1545 COMPREHENSIVE METABOLIC PANEL, NON-FASTING(!!)  Sodium 111, chloride 74, BUN 20, ALT 264, AST 94, bilirubin 2.1 increased from 48 hours ago   1546 URINALYSIS WITH REFLEX MICROSCOPIC AND CULTURE IF POSITIVE(!)  Cloudy, trace protein, small amount of blood, small amount of bacteria, WBCs, not nitrite positive, could wait for culture we will defer to admitting team   1548 ECG 12 LEAD   1548 ECG 12 LEAD  Normal sinus rhythm, QTC 472 no ST elevation noted.      Medications Administered in the ED   NS bolus infusion 1,000 mL (1,000 mL Intravenous New Bag/New Syringe 12/18/22 1509)   ondansetron (ZOFRAN ODT) rapid dissolve tablet (8 mg Oral Given 12/18/22 1434)        Emergency Department  Procedure:  Procedures                         Medical Decision Making  Nursing notes reviewed.    Medical chart and diagnostic results reviewed.    Attending available for questioning consult.    Emergency department course:     Very pleasant 67 year old female who comes into the emergency department with a complaint of nausea, no vomiting, abdominal pain.  Patient was seen 48 hour years ago for similar symptoms diagnosed with gastritis at that time.  She does have a history of hyponatremia as well as some longstanding acid reflux, gastrointestinal issues with E coli infection, patient states that she has not had much of an appetite.  She has been trying to stay well hydrated.  In the emergency room she was found to have a sodium of 111, she was given and p.o. challenge which she did tolerate well.  Due to her sodium level she was admitted to the hospital with the ICU service at [facility] kindly accepted for further management.  Patient verbalized understanding, she is agreeable to transfer.      Amount and/or  Complexity of Data Reviewed  Labs: ordered. Decision-making details documented in ED Course.  ECG/medicine tests: ordered and independent interpretation performed. Decision-making details documented in ED Course.    Risk  Prescription drug management.  Decision regarding hospitalization.         Findings and diagnosis discussed with patient.  Clinical Impression:   Clinical Impression   Hyponatremia (Primary)         Disposition: Admitted          No follow-ups on file.   New Prescriptions    No medications on file      No follow-up provider specified.    Future Appointments  Future Appointments   Date Time Provider Department Center   12/25/2022  2:00 PM Rhys Martini [facility] PRN   05/16/2023  3:00 PM Samuella Cota, PA-C [facility] PRN        BP (!) 151/86   Pulse 82   Temp 37 C (98.6 F)   Resp 17   Ht 1.676 m (5\' 6" )   Wt 61.2 kg (135 lb)   SpO2 100%   BMI 21.79 kg/m        Bonney Leitz, FNP12/09/2022              This note was partially created using voice recognition software and is inherently subject to errors including those of syntax and "sound alike " substitutions which may escape proof reading.  In such instances, original meaning may be extrapolated by contextual derivation.

## 2022-12-19 ENCOUNTER — Telehealth (INDEPENDENT_AMBULATORY_CARE_PROVIDER_SITE_OTHER): Payer: Self-pay | Admitting: Internal Medicine

## 2022-12-19 DIAGNOSIS — R112 Nausea with vomiting, unspecified: Secondary | ICD-10-CM

## 2022-12-19 DIAGNOSIS — G9341 Metabolic encephalopathy: Secondary | ICD-10-CM

## 2022-12-19 DIAGNOSIS — R197 Diarrhea, unspecified: Secondary | ICD-10-CM

## 2022-12-19 LAB — BASIC METABOLIC PANEL
ANION GAP: 12 mmol/L (ref 4–13)
ANION GAP: 7 mmol/L (ref 4–13)
ANION GAP: 8 mmol/L (ref 4–13)
ANION GAP: 8 mmol/L (ref 4–13)
ANION GAP: 8 mmol/L (ref 4–13)
ANION GAP: 8 mmol/L (ref 4–13)
ANION GAP: 8 mmol/L (ref 4–13)
ANION GAP: 9 mmol/L (ref 4–13)
ANION GAP: 9 mmol/L (ref 4–13)
ANION GAP: 9 mmol/L (ref 4–13)
ANION GAP: 9 mmol/L (ref 4–13)
BUN/CREA RATIO: 18 (ref 6–22)
BUN/CREA RATIO: 18 (ref 6–22)
BUN/CREA RATIO: 18 (ref 6–22)
BUN/CREA RATIO: 20 (ref 6–22)
BUN/CREA RATIO: 20 (ref 6–22)
BUN/CREA RATIO: 22 (ref 6–22)
BUN/CREA RATIO: 22 (ref 6–22)
BUN/CREA RATIO: 22 (ref 6–22)
BUN/CREA RATIO: 23 — ABNORMAL HIGH (ref 6–22)
BUN/CREA RATIO: 25 — ABNORMAL HIGH (ref 6–22)
BUN/CREA RATIO: 26 — ABNORMAL HIGH (ref 6–22)
BUN: 10 mg/dL (ref 7–25)
BUN: 10 mg/dL (ref 7–25)
BUN: 11 mg/dL (ref 7–25)
BUN: 11 mg/dL (ref 7–25)
BUN: 11 mg/dL (ref 7–25)
BUN: 11 mg/dL (ref 7–25)
BUN: 12 mg/dL (ref 7–25)
BUN: 12 mg/dL (ref 7–25)
BUN: 13 mg/dL (ref 7–25)
BUN: 13 mg/dL (ref 7–25)
BUN: 14 mg/dL (ref 7–25)
CALCIUM: 7.8 mg/dL — ABNORMAL LOW (ref 8.6–10.3)
CALCIUM: 7.8 mg/dL — ABNORMAL LOW (ref 8.6–10.3)
CALCIUM: 8.1 mg/dL — ABNORMAL LOW (ref 8.6–10.3)
CALCIUM: 8.1 mg/dL — ABNORMAL LOW (ref 8.6–10.3)
CALCIUM: 8.1 mg/dL — ABNORMAL LOW (ref 8.6–10.3)
CALCIUM: 8.3 mg/dL — ABNORMAL LOW (ref 8.6–10.3)
CALCIUM: 8.3 mg/dL — ABNORMAL LOW (ref 8.6–10.3)
CALCIUM: 8.4 mg/dL — ABNORMAL LOW (ref 8.6–10.3)
CALCIUM: 8.4 mg/dL — ABNORMAL LOW (ref 8.6–10.3)
CALCIUM: 8.6 mg/dL (ref 8.6–10.3)
CALCIUM: 8.9 mg/dL (ref 8.6–10.3)
CHLORIDE: 80 mmol/L — ABNORMAL LOW (ref 98–107)
CHLORIDE: 83 mmol/L — ABNORMAL LOW (ref 98–107)
CHLORIDE: 87 mmol/L — ABNORMAL LOW (ref 98–107)
CHLORIDE: 91 mmol/L — ABNORMAL LOW (ref 98–107)
CHLORIDE: 92 mmol/L — ABNORMAL LOW (ref 98–107)
CHLORIDE: 94 mmol/L — ABNORMAL LOW (ref 98–107)
CHLORIDE: 94 mmol/L — ABNORMAL LOW (ref 98–107)
CHLORIDE: 96 mmol/L — ABNORMAL LOW (ref 98–107)
CHLORIDE: 96 mmol/L — ABNORMAL LOW (ref 98–107)
CHLORIDE: 96 mmol/L — ABNORMAL LOW (ref 98–107)
CHLORIDE: 98 mmol/L (ref 98–107)
CO2 TOTAL: 19 mmol/L — ABNORMAL LOW (ref 21–31)
CO2 TOTAL: 21 mmol/L (ref 21–31)
CO2 TOTAL: 22 mmol/L (ref 21–31)
CO2 TOTAL: 22 mmol/L (ref 21–31)
CO2 TOTAL: 22 mmol/L (ref 21–31)
CO2 TOTAL: 22 mmol/L (ref 21–31)
CO2 TOTAL: 23 mmol/L (ref 21–31)
CO2 TOTAL: 23 mmol/L (ref 21–31)
CO2 TOTAL: 24 mmol/L (ref 21–31)
CO2 TOTAL: 24 mmol/L (ref 21–31)
CO2 TOTAL: 25 mmol/L (ref 21–31)
CREATININE: 0.5 mg/dL — ABNORMAL LOW (ref 0.60–1.30)
CREATININE: 0.51 mg/dL — ABNORMAL LOW (ref 0.60–1.30)
CREATININE: 0.53 mg/dL — ABNORMAL LOW (ref 0.60–1.30)
CREATININE: 0.53 mg/dL — ABNORMAL LOW (ref 0.60–1.30)
CREATININE: 0.53 mg/dL — ABNORMAL LOW (ref 0.60–1.30)
CREATININE: 0.54 mg/dL — ABNORMAL LOW (ref 0.60–1.30)
CREATININE: 0.54 mg/dL — ABNORMAL LOW (ref 0.60–1.30)
CREATININE: 0.55 mg/dL — ABNORMAL LOW (ref 0.60–1.30)
CREATININE: 0.6 mg/dL (ref 0.60–1.30)
CREATININE: 0.6 mg/dL (ref 0.60–1.30)
CREATININE: 0.6 mg/dL (ref 0.60–1.30)
ESTIMATED GFR: 101 mL/min/{1.73_m2} (ref >59)
ESTIMATED GFR: 101 mL/min/{1.73_m2} (ref >59)
ESTIMATED GFR: 101 mL/min/{1.73_m2} (ref >59)
ESTIMATED GFR: 102 mL/min/{1.73_m2} (ref >59)
ESTIMATED GFR: 102 mL/min/{1.73_m2} (ref >59)
ESTIMATED GFR: 102 mL/min/{1.73_m2} (ref >59)
ESTIMATED GFR: 103 mL/min/{1.73_m2} (ref >59)
ESTIMATED GFR: 103 mL/min/{1.73_m2} (ref >59)
ESTIMATED GFR: 99 mL/min/{1.73_m2} (ref >59)
ESTIMATED GFR: 99 mL/min/{1.73_m2} (ref >59)
ESTIMATED GFR: 99 mL/min/{1.73_m2} (ref >59)
GLUCOSE: 105 mg/dL (ref 74–109)
GLUCOSE: 76 mg/dL (ref 74–109)
GLUCOSE: 76 mg/dL (ref 74–109)
GLUCOSE: 84 mg/dL (ref 74–109)
GLUCOSE: 85 mg/dL (ref 74–109)
GLUCOSE: 85 mg/dL (ref 74–109)
GLUCOSE: 85 mg/dL (ref 74–109)
GLUCOSE: 88 mg/dL (ref 74–109)
GLUCOSE: 89 mg/dL (ref 74–109)
GLUCOSE: 92 mg/dL (ref 74–109)
GLUCOSE: 93 mg/dL (ref 74–109)
OSMOLALITY, CALCULATED: 231 mosm/kg — ABNORMAL LOW (ref 270–290)
OSMOLALITY, CALCULATED: 233 mosm/kg — ABNORMAL LOW (ref 270–290)
OSMOLALITY, CALCULATED: 242 mosm/kg — ABNORMAL LOW (ref 270–290)
OSMOLALITY, CALCULATED: 245 mosm/kg — ABNORMAL LOW (ref 270–290)
OSMOLALITY, CALCULATED: 247 mosm/kg — ABNORMAL LOW (ref 270–290)
OSMOLALITY, CALCULATED: 248 mosm/kg — ABNORMAL LOW (ref 270–290)
OSMOLALITY, CALCULATED: 251 mosm/kg — ABNORMAL LOW (ref 270–290)
OSMOLALITY, CALCULATED: 251 mosm/kg — ABNORMAL LOW (ref 270–290)
OSMOLALITY, CALCULATED: 252 mosm/kg — ABNORMAL LOW (ref 270–290)
OSMOLALITY, CALCULATED: 253 mosm/kg — ABNORMAL LOW (ref 270–290)
OSMOLALITY, CALCULATED: 254 mosm/kg — ABNORMAL LOW (ref 270–290)
POTASSIUM: 3.3 mmol/L — ABNORMAL LOW (ref 3.5–5.1)
POTASSIUM: 3.4 mmol/L — ABNORMAL LOW (ref 3.5–5.1)
POTASSIUM: 4 mmol/L (ref 3.5–5.1)
POTASSIUM: 4 mmol/L (ref 3.5–5.1)
POTASSIUM: 4.1 mmol/L (ref 3.5–5.1)
POTASSIUM: 4.2 mmol/L (ref 3.5–5.1)
POTASSIUM: 4.3 mmol/L (ref 3.5–5.1)
POTASSIUM: 4.4 mmol/L (ref 3.5–5.1)
POTASSIUM: 4.4 mmol/L (ref 3.5–5.1)
POTASSIUM: 4.4 mmol/L (ref 3.5–5.1)
POTASSIUM: 4.5 mmol/L (ref 3.5–5.1)
SODIUM: 114 mmol/L — CL (ref 136–145)
SODIUM: 115 mmol/L — CL (ref 136–145)
SODIUM: 120 mmol/L — ABNORMAL LOW (ref 136–145)
SODIUM: 122 mmol/L — ABNORMAL LOW (ref 136–145)
SODIUM: 123 mmol/L — ABNORMAL LOW (ref 136–145)
SODIUM: 124 mmol/L — ABNORMAL LOW (ref 136–145)
SODIUM: 125 mmol/L — ABNORMAL LOW (ref 136–145)
SODIUM: 126 mmol/L — ABNORMAL LOW (ref 136–145)
SODIUM: 126 mmol/L — ABNORMAL LOW (ref 136–145)
SODIUM: 127 mmol/L — ABNORMAL LOW (ref 136–145)
SODIUM: 127 mmol/L — ABNORMAL LOW (ref 136–145)

## 2022-12-19 LAB — COMPREHENSIVE METABOLIC PANEL, NON-FASTING
ALBUMIN/GLOBULIN RATIO: 1.8 — ABNORMAL HIGH (ref 0.8–1.4)
ALBUMIN: 3.9 g/dL (ref 3.5–5.7)
ALKALINE PHOSPHATASE: 75 U/L (ref 34–104)
ALT (SGPT): 148 U/L — ABNORMAL HIGH (ref 7–52)
ANION GAP: 9 mmol/L (ref 4–13)
AST (SGOT): 64 U/L — ABNORMAL HIGH (ref 13–39)
BILIRUBIN TOTAL: 1.7 mg/dL — ABNORMAL HIGH (ref 0.3–1.0)
BUN/CREA RATIO: 26 — ABNORMAL HIGH (ref 6–22)
BUN: 14 mg/dL (ref 7–25)
CALCIUM, CORRECTED: 8 mg/dL — ABNORMAL LOW (ref 8.9–10.8)
CALCIUM: 7.9 mg/dL — ABNORMAL LOW (ref 8.6–10.3)
CHLORIDE: 83 mmol/L — ABNORMAL LOW (ref 98–107)
CO2 TOTAL: 23 mmol/L (ref 21–31)
CREATININE: 0.54 mg/dL — ABNORMAL LOW (ref 0.60–1.30)
ESTIMATED GFR: 101 mL/min/{1.73_m2} (ref >59)
GLOBULIN: 2.2 (ref 2.0–3.5)
GLUCOSE: 88 mg/dL (ref 74–109)
OSMOLALITY, CALCULATED: 233 mosm/kg — ABNORMAL LOW (ref 270–290)
POTASSIUM: 3.5 mmol/L (ref 3.5–5.1)
PROTEIN TOTAL: 6.1 g/dL — ABNORMAL LOW (ref 6.4–8.9)
SODIUM: 115 mmol/L — CL (ref 136–145)

## 2022-12-19 LAB — CBC
HCT: 39 % (ref 31.2–41.9)
HGB: 14.6 g/dL — ABNORMAL HIGH (ref 10.9–14.3)
MCH: 31.7 pg (ref 24.7–32.8)
MCHC: 37.3 g/dL — ABNORMAL HIGH (ref 32.3–35.6)
MCV: 84.8 fL (ref 75.5–95.3)
MPV: 7.2 fL — ABNORMAL LOW (ref 7.9–10.8)
PLATELETS: 219 10*3/uL (ref 140–440)
RBC: 4.6 10*6/uL (ref 3.63–4.92)
RDW: 14.7 % (ref 12.3–17.7)
WBC: 8.3 10*3/uL (ref 3.8–11.8)

## 2022-12-19 LAB — SODIUM, RANDOM URINE: SODIUM RANDOM URINE: 20 mmol/L — ABNORMAL LOW (ref 40–220)

## 2022-12-19 LAB — MAGNESIUM: MAGNESIUM: 1.8 mg/dL — ABNORMAL LOW (ref 1.9–2.7)

## 2022-12-19 LAB — LAVENDER TOP TUBE

## 2022-12-19 MED ORDER — SODIUM CHLORIDE 3 % HYPERTONIC INTRAVENOUS INJECTION SOLUTION
15.0000 mL/h | INTRAVENOUS | Status: DC
Start: 2022-12-19 — End: 2022-12-19
  Administered 2022-12-19: 0 mL/h via INTRAVENOUS
  Administered 2022-12-19: 15 mL/h via INTRAVENOUS
  Filled 2022-12-19: qty 500

## 2022-12-19 MED ORDER — ETHYL ALCOHOL 62 % TOPICAL SWAB
1.0000 | Freq: Two times a day (BID) | CUTANEOUS | Status: DC
Start: 2022-12-19 — End: 2022-12-20
  Administered 2022-12-19 – 2022-12-20 (×3): 1 via NASAL

## 2022-12-19 MED ORDER — PANTOPRAZOLE 40 MG TABLET,DELAYED RELEASE
40.0000 mg | DELAYED_RELEASE_TABLET | Freq: Every morning | ORAL | Status: DC
Start: 2022-12-19 — End: 2022-12-20
  Administered 2022-12-19: 0 mg via ORAL

## 2022-12-19 MED ORDER — POTASSIUM CHLORIDE ER 20 MEQ TABLET,EXTENDED RELEASE(PART/CRYST)
40.0000 meq | ORAL_TABLET | ORAL | Status: AC
Start: 2022-12-19 — End: 2022-12-19
  Administered 2022-12-19: 40 meq via ORAL
  Filled 2022-12-19: qty 2

## 2022-12-19 MED ORDER — PROGESTERONE MICRONIZED 100 MG CAPSULE
100.0000 mg | ORAL_CAPSULE | Freq: Every evening | ORAL | Status: DC
Start: 2022-12-22 — End: 2022-12-20

## 2022-12-19 NOTE — Care Plan (Signed)
Pt to CCU 11 shortly after shift change. Pt alert and oriented x4, VSS on room air. Pt educated on electrolyte imbalances and need for serial labs. Pt able to stand and walk to bed with stand by assist. 3% saline gtt initiated. See MAR for rate. Initial assessment done by primary RN. Care ongoing.

## 2022-12-19 NOTE — Progress Notes (Signed)
[place] [facility]    HOSPITALIST PROGRESS NOTE    Sonya Parks  Date of service: 12/19/2022  Date of Admission:  12/18/2022  Hospital Day:  LOS: 1 day     Subjective:   Patient seen and examined. Admitted overnight for severe hyponatremia, started on hypertonic saline infusion/serial labs. Na 122 this AM. Awake, alert, oriented. No events overnight. Labs, VS, imaging reviewed.      Vital Signs:  Filed Vitals:    12/19/22 0915 12/19/22 1000 12/19/22 1100 12/19/22 1200   BP: 127/80 126/72 126/72 124/67   Pulse: 88 83 84 74   Resp: 13 18 14  (!) 10   Temp:    36.7 C (98.1 F)   SpO2: 99% 99% 98% 98%        Physical Exam:  General:  Patient in NAD, resting in bed, no visitors present  Head:  Normocephalic, atraumatic  Eyes:  PERRL, anicteric sclera  ENT:  Oral mucosa moist, no nasal discharge   Neck:  Soft, supple, trachea midline  Heart:  RRR, S1 and S2 normal  Lungs:  Unlabored respirations.  Lungs are clear to auscultation bilaterally, with no wheezes, no rales, no conversational dyspnea  Abdomen:  Soft, active bowel sounds, non-tender to palpation, non-distended  Extremities:  Pulses equal bilaterally.  Capillary refill less than 3 seconds.  No edema in lower extremities bilaterally   Skin:  Warm and dry, not diaphoretic.  No ecchymosis noted.   Neuro:  A&O x 3.  No focal deficits.  Speech intact  Psych:  Cooperative, not agitated    Intake & Output:    Intake/Output Summary (Last 24 hours) at 12/19/2022 1402  Last data filed at 12/19/2022 1000  Gross per 24 hour   Intake 1600 ml   Output 700 ml   Net 900 ml     I/O current shift:  12/10 0700 - 12/10 1859  In: 360 [P.O.:360]  Out: 700 [Urine:700]  Emesis:    BM:       Heme:      acetaminophen (TYLENOL) tablet, 650 mg, Oral, Q4H PRN  alcohol 62 % (NOZIN NASAL SANITIZER) nasal swab packet, 1 Each, Each Nostril, 2x/day  aluminum-magnesium hydroxide-simethicone (MAG-AL PLUS) 200-200-20 mg per 5 mL oral liquid, 30 mL, Oral, Q4H  PRN  atorvastatin (LIPITOR) tablet, 10 mg, Oral, Daily  buPROPion (WELLBUTRIN SR) sustained release tablet, 150 mg, Oral, 2x/day  busPIRone (BUSPAR) tablet, 5 mg, Oral, 2x/day  celecoxib (CeleBREX) capsule, 200 mg, Oral, 2x/day  enoxaparin PF (LOVENOX) 40 mg/0.4 mL SubQ injection, 40 mg, Subcutaneous, Q24H  [START ON 12/20/2022] estradiol (ESTRACE) 0.01% vaginal cream, , Vaginal, M, W, and F  estradiol 0.0375 mg per 24 hour semi-weekly transdermal patch, 0.0375 mg, Transdermal, TU and FR  lifitegrast 5 % Dropperette 1 Drop, 1 Drop, Both Eyes, 2x/day  lisinopril (PRINIVIL) tablet, 5 mg, Oral, Daily  naltrexone Capsule 4.5 mg, 1 Capsule, Oral, Daily  NS flush syringe, 3 mL, Intracatheter, Q8HRS  NS flush syringe, 3 mL, Intracatheter, Q1H PRN  ondansetron (ZOFRAN) 2 mg/mL injection, 4 mg, Intravenous, Q6H PRN  pancreatic enzyme replacement (CREON) 12,000 units lipase per cap, 2 Capsule, Oral, 3x/day-Meals  [START ON 12/22/2022] progesterone (PROMETRIUM) capsule, 100 mg, Oral, NIGHTLY          Labs:  Recent Results (from the past 48 hour(s))   CBC WITH DIFF    Collection Time: 12/18/22  1:54 PM   Result Value    WBC 11.6 (H)  HGB 16.1 (H)    HCT 46.0    PLATELETS 247      Results for orders placed or performed during the hospital encounter of 12/18/22 (from the past 48 hour(s))   BASIC METABOLIC PANEL    Collection Time: 12/19/22 12:54 PM   Result Value    SODIUM 124 (L)    POTASSIUM 4.4    CHLORIDE 92 (L)    CO2 TOTAL 24    GLUCOSE 85    BUN 11    CREATININE 0.60      Recent Results (from the past 48 hour(s))   COMPREHENSIVE METABOLIC PANEL, NON-FASTING    Collection Time: 12/19/22  2:54 AM   Result Value    ALKALINE PHOSPHATASE 75    ALT (SGPT) 148 (H)    AST (SGOT) 64 (H)   LIPASE    Collection Time: 12/18/22  1:54 PM   Result Value    LIPASE 19   COMPREHENSIVE METABOLIC PANEL, NON-FASTING    Collection Time: 12/18/22  1:54 PM   Result Value    ALKALINE PHOSPHATASE 117 (H)    ALT (SGPT) 264 (H)    AST (SGOT) 94 (H)       No results found for this or any previous visit (from the past 48 hour(s)).   No results found for this or any previous visit (from the past 48 hour(s)).   No results found for this or any previous visit (from the past 1344 hour(s)).   No results found for this or any previous visit (from the past 48 hour(s)).     Microbiology:  No results found for any visits on 12/18/22 (from the past 96 hour(s)).    Imaging:   ECG 12 LEAD  Normal sinus rhythm  Biatrial enlargement  Abnormal ECG  No previous ECGs available  Confirmed by Bonney Leitz [phone]) on 12/18/2022 3:48:06 PM        Assessment/ Plan:   Active Hospital Problems   (*Primary Problem)    Diagnosis    *Acute hyponatremia    Nausea vomiting and diarrhea    Acute metabolic encephalopathy       Nutrition:   DIET CLEAR LIQUID Do you want to initiate MNT Protocol? Yes; Restrict fluids to: 1500 ML             Additional clinical characteristics related to nutrition:    - monitor for weight changes   - monitor intake and output    - monitor bowel functions        Lab Results   Component Value Date    ALBUMIN 3.9 12/19/2022       Hypoosmolar hyponatremia, euvolemic  Nausea, vomiting, diarrhea-resolved      12/19/22- Na 122 this AM, then 124. Will stop hypertonic saline. Continue serial BMPLs. Continue 1500 mL fluid restriction, advance diet as tolerated. 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 Repeat labs AM. Plan of care discussed on rounds.    DVT Ppx: LMWH  GI Ppx: none  Full code    Disposition Planning:  Home    Sarita Haver, FNP-BC  Patient seen with MLP and agree with plan and care as discussed and dictated.   12/19/2022  [place] [facility] HOSPITALIST

## 2022-12-19 NOTE — Care Management Notes (Signed)
[facility]  Care Management Initial Evaluation    Patient Name: Sonya Parks  Date of Birth: September 16, 1955  Sex: female  Date/Time of Admission: 12/18/2022  1:43 PM  Room/Bed: CCU11/A  Payor: [employer] / Plan: [employer] 90/10 / Product Type: PPO /   Primary Care Providers:  Rhys Martini, PA-C (General)    Pharmacy Info:   Preferred Pharmacy       [facility]    [address] [place] [address]    Phone: [phone] Fax: [phone]    Hours: Monday-Friday 7:30AM-6PM, Saturday 10AM-2PM, Sunday Closed          Emergency Contact Info:   Extended Emergency Contact Information  Primary Emergency Contact: Toy Cookey  Mobile Phone: [phone]  Relation: Daughter  Preferred language: English  Interpreter needed? No  Secondary Emergency ContactMargot Ables  Mobile Phone: [phone]  Relation: Daughter  Preferred language: English  Interpreter needed? No    History:   [name] is a 67 y.o., female, admitted 12/18/22    Height/Weight: 167.6 cm (5\' 6" ) / 61.6 kg (135 lb 12.9 oz)     LOS: 1 day   Admitting Diagnosis: Acute hyponatremia [E87.1]    Assessment:    12/19/22 1322   Assessment Details   Assessment Type Admission   Date of Care Management Update 12/19/22   Readmission   Is this a readmission? No   Insurance Information/Type   Insurance type Commercial   Employment/Financial   Patient has Prescription Coverage?  Yes        Name of Insurance Coverage for Medications [employer]   Financial/Environmental Concerns none   Living Environment   Lives With alone   Living Arrangements house   Able to Return to Prior Arrangements yes   Home Safety   Home Assessment: No Problems Identified   Home Accessibility no concerns   Custody and Legal Status   Do you have a court appointed guardian/conservator? No   Are you an emancipated minor? No   Custody Issues? No   Paternity Affidavit Requested? No   Care Management Plan   Discharge Planning Status  initial meeting   Projected Discharge Date 12/21/22   Discharge plan discussed with: Patient   CM will evaluate for rehabilitation potential no   Discharge Needs Assessment   Equipment Currently Used at Home none   Equipment Needed After Discharge none   Discharge Facility/Level of Care Needs Home (Patient/Family Member/other)(code 1)   Transportation Available car;family or friend will provide   Referral Information   Admission Type inpatient   Address Verified verified-no changes   Arrived From home or self-care   ADVANCE DIRECTIVES   Does the Patient have an Advance Directive? Yes, Patient Does Have Advance Directive for Healthcare Treatment   Type of Advance Directive Completed Medical Power of Attorney   Copy of Advance Directives in Chart? Yes, Copy on Chart.(Specify in Comment Which Advance Directive)   Name of MPOA or Healthcare Surrogate [place]/Kellen Hylton-daughters   Phone Number of MPOA or Healthcare Surrogate [phone]           Discharge Plan:  Home (Patient/Family Member/other) (code 1)    Initial CM assessment completed.  Pt admitted for acute hyponatremia.  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  She has three adult children.  Trigger received for MPOA.  MPOA completed and copies given to pt and placed on chart.  No discharge  needs identified at this time.  Discharge plans are to return home .    The patient will continue to be evaluated for developing discharge needs.     Case Manager: Julienne Kass, RN  Phone: [phone]

## 2022-12-20 LAB — CBC
HCT: 41.7 % (ref 31.2–41.9)
HGB: 15 g/dL — ABNORMAL HIGH (ref 10.9–14.3)
MCH: 31.7 pg (ref 24.7–32.8)
MCHC: 36 g/dL — ABNORMAL HIGH (ref 32.3–35.6)
MCV: 88 fL (ref 75.5–95.3)
MPV: 7.7 fL — ABNORMAL LOW (ref 7.9–10.8)
PLATELETS: 223 10*3/uL (ref 140–440)
RBC: 4.73 10*6/uL (ref 3.63–4.92)
RDW: 14.6 % (ref 12.3–17.7)
WBC: 7.3 10*3/uL (ref 3.8–11.8)

## 2022-12-20 LAB — BASIC METABOLIC PANEL
ANION GAP: 7 mmol/L (ref 4–13)
ANION GAP: 7 mmol/L (ref 4–13)
ANION GAP: 9 mmol/L (ref 4–13)
BUN/CREA RATIO: 20 (ref 6–22)
BUN/CREA RATIO: 17 (ref 6–22)
BUN/CREA RATIO: 18 (ref 6–22)
BUN: 10 mg/dL (ref 7–25)
BUN: 9 mg/dL (ref 7–25)
BUN: 11 mg/dL (ref 7–25)
CALCIUM: 8.9 mg/dL (ref 8.6–10.3)
CALCIUM: 8.9 mg/dL (ref 8.6–10.3)
CALCIUM: 9.1 mg/dL (ref 8.6–10.3)
CHLORIDE: 99 mmol/L (ref 98–107)
CHLORIDE: 99 mmol/L (ref 98–107)
CHLORIDE: 97 mmol/L — ABNORMAL LOW (ref 98–107)
CO2 TOTAL: 23 mmol/L (ref 21–31)
CO2 TOTAL: 24 mmol/L (ref 21–31)
CO2 TOTAL: 23 mmol/L (ref 21–31)
CREATININE: 0.5 mg/dL — ABNORMAL LOW (ref 0.60–1.30)
CREATININE: 0.54 mg/dL — ABNORMAL LOW (ref 0.60–1.30)
CREATININE: 0.6 mg/dL (ref 0.60–1.30)
ESTIMATED GFR: 103 mL/min/{1.73_m2} (ref >59)
ESTIMATED GFR: 101 mL/min/{1.73_m2} (ref >59)
ESTIMATED GFR: 99 mL/min/{1.73_m2} (ref >59)
GLUCOSE: 86 mg/dL (ref 74–109)
GLUCOSE: 81 mg/dL (ref 74–109)
GLUCOSE: 72 mg/dL — ABNORMAL LOW (ref 74–109)
OSMOLALITY, CALCULATED: 257 mosm/kg — ABNORMAL LOW (ref 270–290)
OSMOLALITY, CALCULATED: 259 mosm/kg — ABNORMAL LOW (ref 270–290)
OSMOLALITY, CALCULATED: 257 mosm/kg — ABNORMAL LOW (ref 270–290)
POTASSIUM: 4.1 mmol/L (ref 3.5–5.1)
POTASSIUM: 4.1 mmol/L (ref 3.5–5.1)
POTASSIUM: 4 mmol/L (ref 3.5–5.1)
SODIUM: 129 mmol/L — ABNORMAL LOW (ref 136–145)
SODIUM: 130 mmol/L — ABNORMAL LOW (ref 136–145)
SODIUM: 129 mmol/L — ABNORMAL LOW (ref 136–145)

## 2022-12-20 LAB — COMPREHENSIVE METABOLIC PANEL, NON-FASTING
ALBUMIN/GLOBULIN RATIO: 1.9 — ABNORMAL HIGH (ref 0.8–1.4)
ALBUMIN: 4.1 g/dL (ref 3.5–5.7)
ALKALINE PHOSPHATASE: 75 U/L (ref 34–104)
ALT (SGPT): 116 U/L — ABNORMAL HIGH (ref 7–52)
ANION GAP: 12 mmol/L (ref 4–13)
AST (SGOT): 41 U/L — ABNORMAL HIGH (ref 13–39)
BILIRUBIN TOTAL: 1 mg/dL (ref 0.3–1.0)
BUN/CREA RATIO: 20 (ref 6–22)
BUN: 10 mg/dL (ref 7–25)
CALCIUM, CORRECTED: 8.8 mg/dL — ABNORMAL LOW (ref 8.9–10.8)
CALCIUM: 8.9 mg/dL (ref 8.6–10.3)
CHLORIDE: 98 mmol/L (ref 98–107)
CO2 TOTAL: 21 mmol/L (ref 21–31)
CREATININE: 0.5 mg/dL — ABNORMAL LOW (ref 0.60–1.30)
ESTIMATED GFR: 103 mL/min/{1.73_m2} (ref >59)
GLOBULIN: 2.2 (ref 2.0–3.5)
GLUCOSE: 83 mg/dL (ref 74–109)
OSMOLALITY, CALCULATED: 261 mosm/kg — ABNORMAL LOW (ref 270–290)
POTASSIUM: 4.1 mmol/L (ref 3.5–5.1)
PROTEIN TOTAL: 6.3 g/dL — ABNORMAL LOW (ref 6.4–8.9)
SODIUM: 131 mmol/L — ABNORMAL LOW (ref 136–145)

## 2022-12-20 LAB — MAGNESIUM: MAGNESIUM: 1.9 mg/dL (ref 1.9–2.7)

## 2022-12-20 NOTE — Discharge Summary (Signed)
[facility]  DISCHARGE SUMMARY    PATIENT NAME:  Sonya Parks, Sonya Parks  MRN:  [id number]  DOB:  [birth year]    ENCOUNTER DATE:  12/18/2022  INPATIENT ADMISSION DATE: 12/18/2022  DISCHARGE DATE:  12/20/2022    ATTENDING PHYSICIAN: Synetta Fail, MD  SERVICE: PRN HOSPITALIST INTENSIVIST  PRIMARY CARE PHYSICIAN: Samuella Cota, PA-C       No lay caregiver identified.    PRIMARY DISCHARGE DIAGNOSIS: Acute hyponatremia  Active Hospital Problems   No active problems to display.      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        Details   progesterone micronized 100 mg Capsule  Commonly known as: Prometrium  What changed: Another medication with the same name was removed. Continue taking this medication, and follow the directions you see here.  Notes to patient: Take as directed   TAKE 1 CAPSULE BY MOUTH ONCE DAILY AT BEDTIME ON DAYS 1-25 OF CALENDAR MONTH  Qty: 30 Capsule  Refills: 4            CONTINUE these medications - NO CHANGES were made during your visit.        Details   atorvastatin 10 mg Tablet  Commonly known as: LIPITOR  Notes to patient: Take as directed   10 mg, Oral, DAILY  Qty: 90 Tablet  Refills: 4     buPROPion 300 mg Tablet Extended Release 24 hr  Commonly known as:  WELLBUTRIN XL  Notes to patient: Take as directed   TAKE 1 TABLET BY MOUTH EVERY MORNING  Qty: 90 Tablet  Refills: 2     busPIRone 15 mg Tablet  Commonly known as: BUSPAR  Notes to patient: Take as directed   15 mg, Oral, 2 TIMES DAILY  Qty: 60 Tablet  Refills: 3     celecoxib 200 mg Capsule  Commonly known as: CeleBREX  Notes to patient: Take as directed   TAKE 1 CAPSULE BY MOUTH TWICE DAILY  Qty: 180 Capsule  Refills: 2     Creon 24,000-76,000 -120,000 unit Capsule, Delayed Release(E.C.)  Generic drug: lipase-protease-amylase  Notes to patient: Take as directed   Take 1 capsule (24,000 units of lipase total) by mouth in the morning and 1 capsule at noon and 1 capsule in the evening. Take with meals.  Qty: 120 Capsule  Refills: 3     * estradioL 0.01 % (0.1 mg/gram) Cream  Commonly known as: ESTRACE  Notes to patient: Take as directed   Apply a pea-sized amount to urethral opening every night for 1 month.  Then reduce to Monday-Wednesday-Friday dosing as directed  Qty: 42.5  g  Refills: 11     * estradioL 0.0375 mg/24 hr Patch Semiweekly  Commonly known as: Vivelle-Dot  Notes to patient: Take as directed   APPLY 1 PATCH TOPICALLY TO CLEAN, DRY, AND HAIR-FREE AREA OF THE LOWER STOMACH OR UPPER BUTTOCK AREA TWICE A WEEK (TUESDAY & FRIDAY)  Qty: 8 Patch  Refills: 4     lisinopriL 5 mg Tablet  Commonly known as: PRINIVIL  Notes to patient: Take as directed   5 mg, Oral, DAILY  Qty: 30 Tablet  Refills: 0     naltrexone 4.5 mg Capsule  Notes to patient: Take as directed   1 Capsule, Oral, DAILY  Refills: 0     Xiidra 5 % Dropperette  Generic drug: lifitegrast  Notes to patient: Take as directed   Use 1 drop in both eyes twice daily  Qty: 60 Each  Refills: 5           * This list has 2 medication(s) that are the same as other medications prescribed for you. Read the directions carefully, and ask your doctor or other care provider to review them with you.                STOP taking these medications.      ciclopirox 8 %  Solution  Commonly known as: PENLAC  Notes to patient: Stop these meds     pantoprazole 20 mg Tablet, Delayed Release (E.C.)  Commonly known as: PROTONIX  Notes to patient: Stop these meds            ASK your doctor about these medications.        Details   Voquezna 20 mg Tablet  Generic drug: vonoprazan  Notes to patient: Take as directed   Take 1 Tablet (20 mg total) by mouth Once a day  Qty: 90 Tablet  Refills: 3            Discharge med list refreshed?  YES     Allergies   Allergen Reactions    Bee Venom Protein (Honey Bee) Swelling     hives    Metoprolol  Other Adverse Reaction (Add comment)     Severe fatigue    Penicillins Rash    Berberine      N/v    Garlic     Oregano Oil      N/v     Poison Ivy Extract Hives/ Urticaria     HOSPITAL PROCEDURE(S):   No orders of the defined types were placed in this encounter.      REASON FOR HOSPITALIZATION AND HOSPITAL COURSE   BRIEF HPI:  This is a 67 y.o., female admitted for hyponatremia, n/v/d  BRIEF HOSPITAL NARRATIVE:     Sonya Parks is a 67 year old female who presented to the ED at [facility] on 12/18/22 with lightheadedness, dizziness, nausea/vomiting/diarrhea. She was found to have critically low sodium level of 111. CBC appeared hemoconcentrated. Urine appeared concentrated. She was admitted to ICU and initially given gentle IV fluids, then 3% saline infusion with serial BMPLs. Sodium slowly improved, and once up above 122 3% saline stopped. She was placed on fluid restriction initially, and sodium continued to improve. She is now taking PO intake well, without any nausea/vomiting/diarrhea. Sodium up to 131 today. 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 Medication review did not reveal any SSRIs or thiazides. TSH WNL. She was noted  to be on bupropion and celecoxib as possible contributors.     Overall, she is significantly improved, up ambulating around, taking PO well, and reports  symptoms have resolved. It was felt that she is stable to be discharged to home today, to have repeat BMPL in couple of days, and follow up with PCP in 1-2 weeks.      TRANSITION/POST DISCHARGE CARE/PENDING TESTS/REFERRALS: BMPL 2-3 days, follow up with PCP 1-2 weeks    CONDITION ON DISCHARGE:  A. Ambulation: Full ambulation  B. Self-care Ability: Complete  C. Cognitive Status Alert and Oriented x 3  D. Code status at discharge:       LINES/DRAINS/WOUNDS AT DISCHARGE:   Patient Lines/Drains/Airways Status       Active Line / Dialysis Catheter / Dialysis Graft / Drain / Airway / Wound       None                    DISCHARGE DISPOSITION:  Home discharge  DISCHARGE INSTRUCTIONS:  Post-Discharge Follow Up Appointments       Monday Dec 25, 2022    Return Patient Visit with Samuella Cota, PA-C at  2:00 PM      Wednesday May 16, 2023    Return Patient Visit with Samuella Cota, PA-C at  3:00 PM      Internal Medicine, Building A  [facility]  [address]  [place] [address]  [phone]             BASIC METABOLIC PANEL    Results to PCP     Release to patient Automated [100001]           Sarita Haver, FNP-BC    Patient seen with [facility] and agree with the discharge summary as discussed and dictated.     Copies sent to Care Team         Relationship Specialty Notifications Start End    Rhys Martini PCP - General PHYSICIAN ASSISTANT All results, Admissions 02/28/21     Phone: [phone] Fax: [phone]         [address] [place] [address]            Referring providers can utilize [email or web address] to access their referred [facility] patient's information.

## 2022-12-20 NOTE — Discharge Instructions (Signed)
Hyponatremia discharge

## 2022-12-20 NOTE — Nurses Notes (Signed)
Patient discharged to home at this time via car driven by family friend/ her neighbor. All patient belongings sent including purse , cell phone, clothing. No changes in previous assessment, discharge instructions reviewed.

## 2022-12-20 NOTE — Nurses Notes (Signed)
Pt calls out using call light. States she needs to use bathroom. Assisted pt to bathroom without difficulty. Pt tolerated activity well. No distress noted. VSS Scope sinus without ectopy.

## 2022-12-20 NOTE — Care Plan (Signed)
Pt has rested well during the night, no distress noted. Pt remains alert/oriented. Scope sinus without ectopy. Pt has ambulated well to/from bathroom.     Problem: Adult Inpatient Plan of Care  Goal: Plan of Care Review  Outcome: Ongoing (see interventions/notes)  Flowsheets (Taken 12/20/2022 0731)  Progress: improving  Plan of Care Reviewed With: patient     Problem: Adult Inpatient Plan of Care  Goal: Patient-Specific Goal (Individualized)  Outcome: Ongoing (see interventions/notes)  Flowsheets (Taken 12/20/2022 0349)  Individualized Care Needs: Monitor vitals/ q2h bmp  Anxieties, Fears or Concerns: Concerned about electrolyes

## 2022-12-20 NOTE — Progress Notes (Signed)
[place] MEDICINE [facility]    HOSPITALIST PROGRESS NOTE    Cathey Endow  Date of service: 12/20/2022  Date of Admission:  12/18/2022  Hospital Day:  LOS: 2 days     Subjective:   Patient seen and examined. Awake, alert, oriented. No n/v/d. Hypertonic saline stopped yesterday. Na 131 today. Taking clear liquids well. No events overnight per nursing. Labs, VS, imaging reviewed.      Vital Signs:  Filed Vitals:    12/20/22 1000 12/20/22 1100 12/20/22 1200 12/20/22 1300   BP: 114/76 120/65 135/68 116/83   Pulse: 80 75 77 72   Resp: 18 13 17 14    Temp:       SpO2: 100% 100% 100% 99%        Physical Exam:  General:  Patient in NAD, resting in bed, no visitors present  Head:  Normocephalic, atraumatic  Eyes:  PERRL, anicteric sclera  ENT:  Oral mucosa moist, no nasal discharge   Neck:  Soft, supple, trachea midline  Heart:  RRR, S1 and S2 normal  Lungs:  Unlabored respirations.  Lungs are clear to auscultation bilaterally, with no wheezes, no rales, no conversational dyspnea  Abdomen:  Soft, active bowel sounds, non-tender to palpation, non-distended  Extremities:  Pulses equal bilaterally.  Capillary refill less than 3 seconds.  No edema in lower extremities bilaterally   Skin:  Warm and dry, not diaphoretic.  No ecchymosis noted.   Neuro:  A&O x 3.  No focal deficits.  Speech intact  Psych:  Cooperative, not agitated    Intake & Output:  No intake or output data in the 24 hours ending 12/20/22 1549    I/O current shift:  No intake/output data recorded.  Emesis:    BM:       Heme:      acetaminophen (TYLENOL) tablet, 650 mg, Oral, Q4H PRN  alcohol 62 % (NOZIN NASAL SANITIZER) nasal swab packet, 1 Each, Each Nostril, 2x/day  aluminum-magnesium hydroxide-simethicone (MAG-AL PLUS) 200-200-20 mg per 5 mL oral liquid, 30 mL, Oral, Q4H PRN  atorvastatin (LIPITOR) tablet, 10 mg, Oral, Daily  buPROPion (WELLBUTRIN SR) sustained release tablet, 150 mg, Oral, 2x/day  busPIRone (BUSPAR) tablet, 5 mg, Oral,  2x/day  celecoxib (CeleBREX) capsule, 200 mg, Oral, 2x/day  enoxaparin PF (LOVENOX) 40 mg/0.4 mL SubQ injection, 40 mg, Subcutaneous, Q24H  estradiol (ESTRACE) 0.01% vaginal cream, , Vaginal, M, W, and F  estradiol 0.0375 mg per 24 hour semi-weekly transdermal patch, 0.0375 mg, Transdermal, TU and FR  lifitegrast 5 % Dropperette 1 Drop, 1 Drop, Both Eyes, 2x/day  lisinopril (PRINIVIL) tablet, 5 mg, Oral, Daily  naltrexone Capsule 4.5 mg, 1 Capsule, Oral, Daily  NS flush syringe, 3 mL, Intracatheter, Q8HRS  NS flush syringe, 3 mL, Intracatheter, Q1H PRN  ondansetron (ZOFRAN) 2 mg/mL injection, 4 mg, Intravenous, Q6H PRN  pancreatic enzyme replacement (CREON) 12,000 units lipase per cap, 2 Capsule, Oral, 3x/day-Meals  pantoprazole (PROTONIX) delayed release tablet, 40 mg, Oral, Daily before Breakfast  [START ON 12/22/2022] progesterone (PROMETRIUM) capsule, 100 mg, Oral, NIGHTLY          Labs:  No results found for this or any previous visit (from the past 48 hour(s)).     Results for orders placed or performed during the hospital encounter of 12/18/22 (from the past 48 hour(s))   BASIC METABOLIC PANEL    Collection Time: 12/20/22 12:22 PM   Result Value    SODIUM 129 (L)    POTASSIUM  4.0    CHLORIDE 97 (L)    CO2 TOTAL 23    GLUCOSE 72 (L)    BUN 11    CREATININE 0.60      Recent Results (from the past 48 hour(s))   COMPREHENSIVE METABOLIC PANEL, NON-FASTING    Collection Time: 12/20/22  4:46 AM   Result Value    ALKALINE PHOSPHATASE 75    ALT (SGPT) 116 (H)    AST (SGOT) 41 (H)   COMPREHENSIVE METABOLIC PANEL, NON-FASTING    Collection Time: 12/19/22  2:54 AM   Result Value    ALKALINE PHOSPHATASE 75    ALT (SGPT) 148 (H)    AST (SGOT) 64 (H)      No results found for this or any previous visit (from the past 48 hour(s)).   No results found for this or any previous visit (from the past 48 hour(s)).   No results found for this or any previous visit (from the past 1344 hour(s)).   No results found for this or any  previous visit (from the past 48 hour(s)).     Microbiology:  No results found for any visits on 12/18/22 (from the past 96 hour(s)).    Imaging:   ECG 12 LEAD  Normal sinus rhythm  Biatrial enlargement  Abnormal ECG  No previous ECGs available  Confirmed by Bonney Leitz [phone]) on 12/18/2022 3:48:06 PM        Assessment/ Plan:   There are no active hospital problems to display for this patient.      Nutrition:   DIET REGULAR Do you want to initiate MNT Protocol? Yes; Additional modifications/limitations: HIGH CALORIE / HIGH PROTEIN             Additional clinical characteristics related to nutrition:    - monitor for weight changes   - monitor intake and output    - monitor bowel functions        Lab Results   Component Value Date    ALBUMIN 4.1 12/20/2022       Hypoosmolar hyponatremia, euvolemic  Nausea, vomiting, diarrhea-resolved      12/19/22- Na 122 this AM, then 124. Will stop hypertonic saline. Continue serial BMPLs. Continue 1500 mL fluid restriction, advance diet as tolerated. 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 Repeat labs AM. Plan of care discussed on rounds.    12/20/22- Na 131 this AM, has been of hypertonic saline since yesterday. Taking PO liquids, will give regular/low FODMAPs diet. Stable for discharge home today.    DVT Ppx: LMWH  GI Ppx: none  Full code    Disposition Planning:  Home    Sarita Haver, FNP-BC  Patient seen with MLP and agree with plan and care as dictated.  12/20/2022  [place] MEDICINE HOSPITALIST

## 2022-12-20 NOTE — Nurses Notes (Signed)
Patient states she feels a lot better this am and is ready to go home. Patient ambulated to bathroom and around room easily with no issues.

## 2022-12-21 ENCOUNTER — Other Ambulatory Visit (INDEPENDENT_AMBULATORY_CARE_PROVIDER_SITE_OTHER): Payer: Self-pay

## 2022-12-21 ENCOUNTER — Telehealth (INDEPENDENT_AMBULATORY_CARE_PROVIDER_SITE_OTHER): Payer: Self-pay | Admitting: Internal Medicine

## 2022-12-21 ENCOUNTER — Encounter (INDEPENDENT_AMBULATORY_CARE_PROVIDER_SITE_OTHER): Payer: Self-pay

## 2022-12-21 DIAGNOSIS — Z7189 Other specified counseling: Secondary | ICD-10-CM

## 2022-12-21 NOTE — Nursing Note (Signed)
Upon review of chart the [facility] outreach was completed on 12/21/22 by   [facility]. Pt has a follow up appt scheduled on 12/22/22. No case management needs identified at this time. TOC closed.     Sonya Bumps Emalyn Schou, RN      Transition of Care Contact Information  Discharge Date: 12/20/2022  Transition Facility Type--Hospital (Inpatient or Observation)  Facility Name--[facility]  Interactive Contact(s): Completed or attempted contact indicated by Date/Time  Completed Contact: 12/21/2022  1:22 PM  First Attempt Call: 12/21/2022  1:22 PM  Contact Method(s)-- Patient/Caregiver Telephone  Clinical Staff Name/Role who contacted--becky  Transition Assessment  Discharge Summary obtained?--Yes  How are you recovering?--Improving  Discharge Meds obtained?--Yes  Discharge medication changes reviewed?--Yes  Full Medication Reconciliation Completed?--Yes  Medication understanding --knows purpose of medication  Medication Concerns?--No  Have everything needed for recovery?--Yes  Care Coordination:   Patient has transition follow-up appointment date and time?--Yes  Follow up appointment date:--12/25/2022  Specialist Transition Visit planned?--No  Specialist Transition Visit date:  Patient/caregiver plans to attend transition visit?--Yes  Primary Follow-up Barrier  Interventions provided --follow-up appointment made  Home Health or DME ordered at discharge?--No  Clinician/Team notified?--No  Primary reason clinician notified?

## 2022-12-23 ENCOUNTER — Ambulatory Visit: Payer: No Typology Code available for payment source | Attending: Internal Medicine

## 2022-12-23 ENCOUNTER — Other Ambulatory Visit: Payer: Self-pay

## 2022-12-23 DIAGNOSIS — R5383 Other fatigue: Secondary | ICD-10-CM | POA: Insufficient documentation

## 2022-12-23 DIAGNOSIS — E538 Deficiency of other specified B group vitamins: Secondary | ICD-10-CM | POA: Insufficient documentation

## 2022-12-23 DIAGNOSIS — R112 Nausea with vomiting, unspecified: Secondary | ICD-10-CM | POA: Insufficient documentation

## 2022-12-23 DIAGNOSIS — K638219 Small intestinal bacterial overgrowth, unspecified: Secondary | ICD-10-CM | POA: Insufficient documentation

## 2022-12-23 DIAGNOSIS — K219 Gastro-esophageal reflux disease without esophagitis: Secondary | ICD-10-CM | POA: Insufficient documentation

## 2022-12-23 DIAGNOSIS — E871 Hypo-osmolality and hyponatremia: Secondary | ICD-10-CM | POA: Insufficient documentation

## 2022-12-23 DIAGNOSIS — R748 Abnormal levels of other serum enzymes: Secondary | ICD-10-CM | POA: Insufficient documentation

## 2022-12-23 DIAGNOSIS — E782 Mixed hyperlipidemia: Secondary | ICD-10-CM | POA: Insufficient documentation

## 2022-12-23 DIAGNOSIS — R197 Diarrhea, unspecified: Secondary | ICD-10-CM | POA: Insufficient documentation

## 2022-12-23 LAB — CBC WITH DIFF
BASOPHIL #: 0.1 10*3/uL (ref 0.00–0.10)
BASOPHIL %: 1 % (ref 0–1)
EOSINOPHIL #: 0.1 10*3/uL (ref 0.00–0.50)
EOSINOPHIL %: 1 % (ref 1–7)
HCT: 43.6 % — ABNORMAL HIGH (ref 31.2–41.9)
HGB: 15 g/dL — ABNORMAL HIGH (ref 10.9–14.3)
LYMPHOCYTE #: 1.2 10*3/uL (ref 1.00–3.00)
LYMPHOCYTE %: 18 % (ref 16–44)
MCH: 30.9 pg (ref 24.7–32.8)
MCHC: 34.5 g/dL (ref 32.3–35.6)
MCV: 89.6 fL (ref 75.5–95.3)
MONOCYTE #: 0.8 10*3/uL (ref 0.30–1.00)
MONOCYTE %: 12 % (ref 5–13)
MPV: 7.4 fL — ABNORMAL LOW (ref 7.9–10.8)
NEUTROPHIL #: 4.5 10*3/uL (ref 1.85–7.80)
NEUTROPHIL %: 68 % (ref 43–77)
PLATELETS: 260 10*3/uL (ref 140–440)
RBC: 4.87 10*6/uL (ref 3.63–4.92)
RDW: 14.2 % (ref 12.3–17.7)
WBC: 6.6 10*3/uL (ref 3.8–11.8)

## 2022-12-23 LAB — THYROID STIMULATING HORMONE (SENSITIVE TSH): TSH: 2.352 u[IU]/mL (ref 0.450–5.330)

## 2022-12-23 LAB — COMPREHENSIVE METABOLIC PNL, FASTING
ALBUMIN/GLOBULIN RATIO: 2 — ABNORMAL HIGH (ref 0.8–1.4)
ALBUMIN: 4.7 g/dL (ref 3.5–5.7)
ALKALINE PHOSPHATASE: 66 U/L (ref 34–104)
ALT (SGPT): 73 U/L — ABNORMAL HIGH (ref 7–52)
ANION GAP: 7 mmol/L (ref 4–13)
AST (SGOT): 30 U/L (ref 13–39)
BILIRUBIN TOTAL: 0.7 mg/dL (ref 0.3–1.0)
BUN/CREA RATIO: 17 (ref 6–22)
BUN: 14 mg/dL (ref 7–25)
CALCIUM, CORRECTED: 8.9 mg/dL (ref 8.9–10.8)
CALCIUM: 9.5 mg/dL (ref 8.6–10.3)
CHLORIDE: 99 mmol/L (ref 98–107)
CO2 TOTAL: 32 mmol/L — ABNORMAL HIGH (ref 21–31)
CREATININE: 0.82 mg/dL (ref 0.60–1.30)
ESTIMATED GFR: 79 mL/min/{1.73_m2} (ref >59)
GLOBULIN: 2.4 (ref 2.0–3.5)
GLUCOSE: 97 mg/dL (ref 74–109)
OSMOLALITY, CALCULATED: 276 mosm/kg (ref 270–290)
POTASSIUM: 3.5 mmol/L (ref 3.5–5.1)
PROTEIN TOTAL: 7.1 g/dL (ref 6.4–8.9)
SODIUM: 138 mmol/L (ref 136–145)

## 2022-12-23 LAB — LIPID PANEL
CHOL/HDL RATIO: 2.2
CHOLESTEROL: 149 mg/dL (ref <200)
HDL CHOL: 67 mg/dL (ref >=40)
LDL CALC: 70 mg/dL (ref 0–100)
TRIGLYCERIDES: 61 mg/dL (ref <=150)
VLDL CALC: 12 mg/dL (ref 0–50)

## 2022-12-23 LAB — CREATINE KINASE (CK), TOTAL, SERUM OR PLASMA: CREATINE KINASE: 127 U/L (ref 30–223)

## 2022-12-23 LAB — BILIRUBIN, CONJUGATED (DIRECT): BILIRUBIN DIRECT: 0.1 md/dL (ref 0.03–0.18)

## 2022-12-25 ENCOUNTER — Other Ambulatory Visit: Payer: Self-pay

## 2022-12-25 ENCOUNTER — Ambulatory Visit: Payer: No Typology Code available for payment source | Attending: Internal Medicine | Admitting: Internal Medicine

## 2022-12-25 ENCOUNTER — Encounter (INDEPENDENT_AMBULATORY_CARE_PROVIDER_SITE_OTHER): Payer: Self-pay | Admitting: Internal Medicine

## 2022-12-25 VITALS — BP 112/80 | HR 106 | Temp 98.3°F | Ht 66.0 in

## 2022-12-25 DIAGNOSIS — E871 Hypo-osmolality and hyponatremia: Secondary | ICD-10-CM | POA: Insufficient documentation

## 2022-12-25 DIAGNOSIS — Z09 Encounter for follow-up examination after completed treatment for conditions other than malignant neoplasm: Secondary | ICD-10-CM | POA: Insufficient documentation

## 2022-12-25 DIAGNOSIS — R630 Anorexia: Secondary | ICD-10-CM | POA: Insufficient documentation

## 2022-12-25 DIAGNOSIS — R682 Dry mouth, unspecified: Secondary | ICD-10-CM | POA: Insufficient documentation

## 2022-12-25 DIAGNOSIS — E559 Vitamin D deficiency, unspecified: Secondary | ICD-10-CM | POA: Insufficient documentation

## 2022-12-25 DIAGNOSIS — R209 Unspecified disturbances of skin sensation: Secondary | ICD-10-CM | POA: Insufficient documentation

## 2022-12-25 MED ORDER — HYDROXYZINE PAMOATE 25 MG CAPSULE
25.0000 mg | ORAL_CAPSULE | Freq: Three times a day (TID) | ORAL | 0 refills | Status: DC | PRN
Start: 2022-12-25 — End: 2023-01-01

## 2022-12-25 NOTE — Progress Notes (Deleted)
INTERNAL MEDICINE, BUILDING A  [address]  [place] [address]  Operated by [facility]  Transitional Care Management Note    Name: Sonya Parks MRN:  [id number]   Date: 12/25/2022 Age: 67 y.o.     Chief Complaint: Hospital Follow Up [facility] follow up pt states that she feels better than she did does states that she is having anxiety and that she did take a 0.5 of ativan and it calmed her down and took one this morning at nine states the anxiety happens more in the eveneng and stronger / ) and Hospital Discharge Transition       SUBJECTIVE:  Sonya Parks is a 67 y.o. female presenting today for follow-up after being discharged. The main problem requiring admission was vomiting and diarrhea. And admitted for low  NA    Pt  said  about   6 x at home  x  6  lower    felt hot  on fire   light headed   tingling and burning  in  random places  in  her body    Hr was good  but BP was  165 systolic  Pt went to the  ER  and  BP low  and  iv  fluids  and  pt  had  vomited  x 4 while in  ER   Na  126 and she was DC    pt  was given  BP  med and DC    Cymbalta   x 2  =  60 mg  Thursday  and Friday       PT did vomit again   and diarrhea    last time on  12/8      12/9  Pt was suppose to be on 1500 ml  fluid   restriction     Pt has had  about   64 oz  today  already       Pt says she had to take an ativan  0. 5 yesterday  as the anxiety  was overwhelming   yesterday    Pt went to work today and got  tingling in feet and arms   she had to take another ativan   I am concerned about  ssri and snri   so we will not go back on those   Pt is ok to use the ativan       2.5 yrs ago  she had acute confusion  and low na           Pt before this  has not been having dumping  before this despite hx of SIBO etc        Pt has salt tabs rx  but  has not taken it today        OBJECTIVE:   BP 112/80 (Site: Left Arm)   Pulse (!) 106   Temp 36.8 C (98.3 F)   Ht 1.676 m (5\' 6" )   SpO2 98%   BMI 21.14 kg/m           Transition of Care Contact Information  Discharge date: Discharge Date: 12/20/2022  Transition Facility Type--Hospital (Inpatient or Observation)  Facility Name--[facility] Interactive Contact(s):  Completed Contact: 12/21/2022  1:22 PM  First Attempt Call: 12/21/2022  1:22 PM  Contact Method(s)-- Patient/Caregiver Telephone  Clinical Staff Name/Role who contacted--becky     Data Reviewed  Medication Reconciliation completed    Assessment &  Plan  Hospital discharge follow-up       {Other transition actions (Optional) -:[id number]}    No follow-ups on file.    Samuella Cota, PA-C

## 2022-12-25 NOTE — Nursing Note (Signed)
Hospital follow up pt states that she feels better than she did does states that she is having anxiety and that she did take a 0.5 of ativan and it calmed her down and took one this morning at nine states the anxiety happens more in the eveneng and stronger.

## 2022-12-26 LAB — BASIC METABOLIC PANEL
ANION GAP: 8 mmol/L (ref 4–13)
BUN/CREA RATIO: 24 — ABNORMAL HIGH (ref 6–22)
BUN: 19 mg/dL (ref 7–25)
CALCIUM: 9.4 mg/dL (ref 8.6–10.3)
CHLORIDE: 99 mmol/L (ref 98–107)
CO2 TOTAL: 30 mmol/L (ref 21–31)
CREATININE: 0.78 mg/dL (ref 0.60–1.30)
ESTIMATED GFR: 84 mL/min/{1.73_m2} (ref >59)
GLUCOSE: 92 mg/dL (ref 74–109)
OSMOLALITY, CALCULATED: 276 mosm/kg (ref 270–290)
POTASSIUM: 4.3 mmol/L (ref 3.5–5.1)
SODIUM: 137 mmol/L (ref 136–145)

## 2022-12-26 LAB — VITAMIN B12: VITAMIN B 12: 1215 pg/mL — ABNORMAL HIGH (ref 180–914)

## 2022-12-26 LAB — VITAMIN D 25 TOTAL: VITAMIN D 25, TOTAL: 29.64 ng/mL — ABNORMAL LOW (ref 30.00–100.00)

## 2022-12-26 LAB — MAGNESIUM: MAGNESIUM: 2.4 mg/dL (ref 1.9–2.7)

## 2022-12-27 ENCOUNTER — Emergency Department
Admission: EM | Admit: 2022-12-27 | Discharge: 2022-12-27 | Disposition: A | Discharge status: Home / Self Care | Payer: No Typology Code available for payment source | Attending: NURSE PRACTITIONER | Admitting: NURSE PRACTITIONER

## 2022-12-27 ENCOUNTER — Encounter (HOSPITAL_BASED_OUTPATIENT_CLINIC_OR_DEPARTMENT_OTHER): Payer: Self-pay

## 2022-12-27 ENCOUNTER — Emergency Department (HOSPITAL_BASED_OUTPATIENT_CLINIC_OR_DEPARTMENT_OTHER): Payer: No Typology Code available for payment source

## 2022-12-27 ENCOUNTER — Other Ambulatory Visit: Payer: Self-pay

## 2022-12-27 DIAGNOSIS — R0602 Shortness of breath: Secondary | ICD-10-CM

## 2022-12-27 DIAGNOSIS — F419 Anxiety disorder, unspecified: Secondary | ICD-10-CM | POA: Insufficient documentation

## 2022-12-27 DIAGNOSIS — E871 Hypo-osmolality and hyponatremia: Secondary | ICD-10-CM | POA: Insufficient documentation

## 2022-12-27 DIAGNOSIS — R42 Dizziness and giddiness: Secondary | ICD-10-CM | POA: Insufficient documentation

## 2022-12-27 DIAGNOSIS — R000 Tachycardia, unspecified: Secondary | ICD-10-CM | POA: Insufficient documentation

## 2022-12-27 DIAGNOSIS — R9431 Abnormal electrocardiogram [ECG] [EKG]: Secondary | ICD-10-CM | POA: Insufficient documentation

## 2022-12-27 DIAGNOSIS — R197 Diarrhea, unspecified: Secondary | ICD-10-CM | POA: Insufficient documentation

## 2022-12-27 LAB — CBC WITH DIFF
BASOPHIL #: 0.02 10*3/uL (ref 0.00–0.30)
BASOPHIL %: 0 % (ref 0–3)
EOSINOPHIL #: 0.06 10*3/uL (ref 0.00–0.80)
EOSINOPHIL %: 1 % (ref 1–7)
HCT: 42.4 % (ref 37.0–47.0)
HGB: 14.7 g/dL (ref 12.5–16.0)
LYMPHOCYTE #: 0.97 10*3/uL — ABNORMAL LOW (ref 1.10–5.00)
LYMPHOCYTE %: 12 % — ABNORMAL LOW (ref 25–45)
MCH: 32.2 pg — ABNORMAL HIGH (ref 27.0–32.0)
MCHC: 34.6 g/dL (ref 32.0–36.0)
MCV: 93 fL (ref 78.0–99.0)
MONOCYTE #: 0.65 10*3/uL (ref 0.00–1.30)
MONOCYTE %: 8 % (ref 0–12)
MPV: 6.9 fL — ABNORMAL LOW (ref 7.4–10.4)
NEUTROPHIL #: 6.49 10*3/uL (ref 1.80–8.40)
NEUTROPHIL %: 79 % — ABNORMAL HIGH (ref 40–76)
PLATELETS: 258 10*3/uL (ref 140–440)
RBC: 4.56 10*6/uL (ref 4.20–5.40)
RDW: 15.5 % — ABNORMAL HIGH (ref 11.6–14.8)
WBC: 8.2 10*3/uL (ref 4.0–10.5)

## 2022-12-27 LAB — MAGNESIUM: MAGNESIUM: 2.4 mg/dL (ref 1.8–2.4)

## 2022-12-27 LAB — ECG 12 LEAD
Atrial Rate: 121 {beats}/min
Calculated P Axis: 82 degrees
Calculated R Axis: 4 degrees
Calculated T Axis: 81 degrees
PR Interval: 158 ms
QRS Duration: 80 ms
QT Interval: 320 ms
QTC Calculation: 454 ms
Ventricular rate: 121 {beats}/min

## 2022-12-27 LAB — URINALYSIS, MACRO/MICRO
BILIRUBIN: NEGATIVE mg/dL
BLOOD: NEGATIVE mg/dL
GLUCOSE: NEGATIVE mg/dL
KETONES: NEGATIVE mg/dL
LEUKOCYTES: NEGATIVE WBCs/uL
NITRITE: NEGATIVE
PH: 7 (ref 4.6–8.0)
PROTEIN: NEGATIVE mg/dL
SPECIFIC GRAVITY: 1.01 (ref 1.003–1.035)
UROBILINOGEN: 0.2 mg/dL (ref 0.2–1.0)

## 2022-12-27 LAB — COMPREHENSIVE METABOLIC PANEL, NON-FASTING
ALBUMIN/GLOBULIN RATIO: 1.3 (ref 0.8–1.4)
ALBUMIN: 3.9 g/dL (ref 3.4–5.0)
ALKALINE PHOSPHATASE: 73 U/L (ref 46–116)
ALT (SGPT): 62 U/L (ref <=78)
ANION GAP: 7 mmol/L (ref 4–13)
AST (SGOT): 25 U/L (ref 15–37)
BILIRUBIN TOTAL: 0.5 mg/dL (ref 0.2–1.0)
BUN/CREA RATIO: 22
BUN: 18 mg/dL (ref 7–18)
CALCIUM, CORRECTED: 9.1 mg/dL
CALCIUM: 9 mg/dL (ref 8.5–10.1)
CHLORIDE: 105 mmol/L (ref 98–107)
CO2 TOTAL: 29 mmol/L (ref 21–32)
CREATININE: 0.83 mg/dL (ref 0.55–1.02)
ESTIMATED GFR: 78 mL/min/{1.73_m2} (ref >59)
GLOBULIN: 2.9
GLUCOSE: 106 mg/dL (ref 74–106)
OSMOLALITY, CALCULATED: 284 mosm/kg (ref 270–290)
POTASSIUM: 4.7 mmol/L (ref 3.5–5.1)
PROTEIN TOTAL: 6.8 g/dL (ref 6.4–8.2)
SODIUM: 141 mmol/L (ref 136–145)

## 2022-12-27 LAB — HEP-2 SUBSTRATE ANTINUCLEAR ANTIBODIES (ANA), SERUM: ANA INTERPRETATION: NEGATIVE

## 2022-12-27 NOTE — Discharge Instructions (Addendum)
Please stay well hydrated    Please take Imodium AD over-the-counter      Please do not hesitate to return to the ER for any new worsening or concerning symptoms

## 2022-12-27 NOTE — ED Nurses Note (Signed)
Patient discharged home all instructions and test results gone over with patient with verbalized understanding. Patient ambulated off unit independently.

## 2022-12-27 NOTE — ED Nurses Note (Signed)
Patient reports hx of low sodium. Patient states she feels as if her sodium is low again. Patient states she is having flushing, heat flashes, tightness in the jaw and forehead. Patient states she has had 5 bowel movements today and that is unusual for her. Patient states she was recently diagnosed with anxiety and her anxiety attacks can be atypical mimicking all these symptoms.

## 2022-12-27 NOTE — ED Provider Notes (Signed)
Emergency Department  [facility]   12/27/2022     Sonya Parks  Mar 25, 1955  67 y.o.  female  [place] [address]   [phone] (home)  PCP: Samuella Cota, PA-C   [id number]    Date of service:12/27/2022 17:02    Chief Complaint:   Chief Complaint   Patient presents with    Dizziness     Patient states that she has been having problems with her sodium and today she has had several BM and feels like her sodium is low.  States she is tingling all over, light headed, and flush feeling and anxiety.       Arrival: The patient arrived by Car and is alone    HPI:  This is a 67 year old female who comes into the emergency room having problems with her sodium.  She has hyponatremia often and has to have her sodium checked regularly, she is also on salt tabs.  She states that today she has had several bowel movements, she feels dehydrated and feels like her sodium may be low, she is lightheaded and feeling flushed and she definitely has anxiety but she did take her anxiety medication prior to coming into the emergency room.  She appears acutely while at bedside no signs and symptoms of respiratory or cardiovascular distress.  However her heart rate at this time is 134.  Patient is nontoxic appearing.    Past Medical History:   Past Medical History:   Diagnosis Date    Chronic pain     Degenerative arthritis     Esophageal reflux     Fatigue 10/09/2021    Fibromyalgia     GAD (generalized anxiety disorder)     Gait disturbance     H/O endoscopy     x3 last one june 2021 dr Karna Christmas    Hyponatremia     IBS (irritable bowel syndrome)     Intestinal methanogen overgrowth     Mixed hyperlipidemia     Osteoarthritis     Other specified disorders of temporomandibular joint     Pancreatic insufficiency     Polymyalgia rheumatica (CMS HCC)     S/P gastrointestinal surgery     sphincter of oddi 2019    Vitamin D deficiency     Weakness        Past Surgical History:   Past Surgical History:   Procedure  Laterality Date    Adenoidectomy      Breast surgery      Colonoscopy      Dental surgery      Hx appendectomy      Hx cataract removal Bilateral     Hx cholecystectomy      Hx hip replacement      Hx tonsillectomy      Hx tubal ligation Bilateral     Sinus surgery         Social History:   Social History     Tobacco Use    Smoking status: Former     Types: Cigarettes    Smokeless tobacco: Never   Vaping Use    Vaping status: Never Used   Substance Use Topics    Alcohol use: Not Currently    Drug use: Never        Family History:  Family History   Problem Relation Age of Onset    Coronary Artery Disease Mother     Hypertension (High Blood Pressure) Mother     Arthritis-osteo Mother  Lung disease Father     Back Problems Father         spinal stenosis           Medications Prior to Admission       Prescriptions    atorvastatin (LIPITOR) 10 mg Oral Tablet    Take 1 Tablet (10 mg total) by mouth Once a day    buPROPion (WELLBUTRIN XL) 300 mg extended release 24 hr tablet    TAKE 1 TABLET BY MOUTH EVERY MORNING    busPIRone (BUSPAR) 15 mg Oral Tablet    Take 1 Tablet (15 mg total) by mouth Twice daily    Patient taking differently:  Take 0.3333 Tablets (5 mg total) by mouth Twice daily    celecoxib (CELEBREX) 200 mg Oral Capsule    TAKE 1 CAPSULE BY MOUTH TWICE DAILY    estradioL (ESTRACE) 0.01 % (0.1 mg/gram) Vaginal Cream    Apply a pea-sized amount to urethral opening every night for 1 month.  Then reduce to Monday-Wednesday-Friday dosing as directed    estradioL (VIVELLE-DOT) 0.0375 mg/24 hr Transdermal Patch Semiweekly    APPLY 1 PATCH TOPICALLY TO CLEAN, DRY, AND HAIR-FREE AREA OF THE LOWER STOMACH OR UPPER BUTTOCK AREA TWICE A WEEK (TUESDAY & FRIDAY)    hydrOXYzine pamoate (VISTARIL) 25 mg Oral Capsule    Take 1 Capsule (25 mg total) by mouth Three times a day as needed for Itching    lifitegrast (XIIDRA) 5 % Ophthalmic Dropperette    Use 1 drop in both eyes twice daily    lipase-protease-amylase (CREON)  24,000 units of lipase Oral Capsule, Delayed Release(E.C.)    Take 1 capsule (24,000 units of lipase total) by mouth in the morning and 1 capsule at noon and 1 capsule in the evening. Take with meals.    lisinopriL (PRINIVIL) 5 mg Oral Tablet    Take 1 Tablet (5 mg total) by mouth Once a day for 30 days    LORazepam (ATIVAN) 0.5 mg Oral Tablet    1 Tablet (0.5 mg total)    naltrexone 4.5 mg Oral Capsule    Take 1 Capsule (4.5 mg total) by mouth Once a day    progesterone micronized (PROMETRIUM) 100 mg Oral Capsule    TAKE 1 CAPSULE BY MOUTH ONCE DAILY AT BEDTIME ON DAYS 1-25 OF CALENDAR MONTH    rifAXIMin (XIFAXAN) 550 mg Oral Tablet    Take 1 tablet (550 mg total) by mouth 3 (three) times a day.    vonoprazan (VOQUEZNA) 20 mg Oral Tablet    Take 1 Tablet (20 mg total) by mouth Once a day    Patient not taking:  Reported on 12/18/2022            Allergies:   Allergies   Allergen Reactions    Bee Venom Protein (Honey Bee) Swelling     hives    Metoprolol  Other Adverse Reaction (Add comment)     Severe fatigue    Penicillins Rash    Berberine      N/v    Garlic     Oregano Oil      N/v     Poison Ivy Extract Hives/ Urticaria       Above history reviewed with patient.  Allergies, medication list, reviewed.        Physical exam:  Body mass index is 20.66 kg/m.  ED Triage Vitals [12/27/22 1526]   BP (Non-Invasive) (!) 141/95   Heart Rate (!) 134  Respiratory Rate 18   Temperature 37.1 C (98.7 F)   SpO2 100 %   Weight 58.1 kg (128 lb)   Height 1.676 m (5\' 6" )     Constitutional: patient is oriented to person, place, and time and well-developed, well-nourished, and in no distress.   HENT:   Head: Normocephalic and atraumatic.   Right Ear: External ear normal.   Left Ear: External ear normal.   Nose: Nose normal.   Mouth/Throat: Oropharynx is clear and moist.   Eyes: Pupils are equal, round, and reactive to light. Conjunctivae and EOM are normal.   Neck: Normal range of motion. Neck supple.   Cardiovascular: Normal  rate, regular rhythm, normal heart sounds and intact distal pulses.   Pulmonary/Chest: Effort normal and breath sounds normal.   Abdominal: Soft. Bowel sounds are normal.   Musculoskeletal: Normal range of motion.   Neurological:Patient is alert and oriented to person, place, and time.  Gait normal. GCS score is 15.   Skin: Skin is warm and dry.   Psychiatric: Mood, memory, affect and judgment normal.      The following orders were placed after examining the patient :  Orders Placed This Encounter    XR AP MOBILE CHEST    CBC/DIFF    COMPREHENSIVE METABOLIC PANEL, NON-FASTING    MAGNESIUM    URINALYSIS WITH REFLEX MICROSCOPIC AND CULTURE IF POSITIVE    CBC WITH DIFF    URINALYSIS, MACRO/MICRO    ECG 12 LEAD      XR AP MOBILE CHEST   Final Result   NEGATIVE CHEST            Radiologist location ID: [id number]            No results found for this or any previous visit (from the past 12 hour(s)).        ED Course:   ED Course as of 12/30/22 1623   Sat Dec 30, 2022   1621 CBC/DIFF(!)  No significant abnormalities that would require admission to the hospital,   1622 COMPREHENSIVE METABOLIC PANEL, NON-FASTING  Sodium 141, potassium 4.7   1622 MAGNESIUM  Magnesium 2.4   1622 URINALYSIS WITH REFLEX MICROSCOPIC AND CULTURE IF POSITIVE  Urinalysis unremarkable   1622 XR AP MOBILE CHEST   1622 XR AP MOBILE CHEST  No acute pathology   1622 ECG 12 LEAD  Sinus tach            Emergency Department Procedure:  Procedures                         Medical Decision Making  Nursing notes reviewed.    Medical chart and diagnostic results reviewed.    Attending available for questioning consult.    Emergency department course:   Pleasant 67 year old female who comes into the emergency room complaining of 1 and her sodium checked, patient is well-known to the emergency department, she has hyponatremia and comes in often to have it checked, she states she was just anxious about it at home she has no complaints at this time.  Patient's  laboratory work EKG chest x-ray rather unremarkable, her sodium was 141.  Patient states that she is feeling well enough to go home.  She will follow up with the primary care.  She verbalizes understanding that if her symptoms persist she can return to the ER for further evaluation.  Otherwise at this time patient is safe and stable for discharge home with  strict return instructions       I have discussed and counseled the patient and available family on the patient's condition, test results, treatment, and likely expected clinical course.  Patient was counseled on supportive care at home.  All questions and concerns were addressed at the bedside.  Patient verbalized understanding and is agreeable with the plan.  Patient is always welcome to return to the ER should new or worsening symptoms occur.  At this time patient is safe and stable for discharge home with strict return instructions.    Amount and/or Complexity of Data Reviewed  Labs: ordered. Decision-making details documented in ED Course.  Radiology: ordered. Decision-making details documented in ED Course.  ECG/medicine tests: ordered. Decision-making details documented in ED Course.         Findings and diagnosis discussed with patient.  Clinical Impression:   Clinical Impression   Diarrhea, unspecified type (Primary)       Disposition: Discharged        No follow-ups on file.   Discharge Medication List as of 12/27/2022  6:14 PM         Samuella Cota, PA-C  [address]  [address]  [place] [address]  [phone]    Schedule an appointment as soon as possible for a visit         Future Appointments  Future Appointments   Date Time Provider Department Center   05/16/2023  3:00 PM Samuella Cota, PA-C [facility] PRN        BP 115/80   Pulse 90   Temp 37.1 C (98.7 F)   Resp 19   Ht 1.676 m (5\' 6" )   Wt 58.1 kg (128 lb)   SpO2 95%   BMI 20.66 kg/m        Bonney Leitz, FNP12/18/2024        This note was partially created using voice  recognition software and is inherently subject to errors including those of syntax and "sound alike " substitutions which may escape proof reading.  In such instances, original meaning may be extrapolated by contextual derivation.

## 2022-12-28 ENCOUNTER — Telehealth (HOSPITAL_COMMUNITY): Payer: Self-pay

## 2022-12-28 ENCOUNTER — Telehealth (INDEPENDENT_AMBULATORY_CARE_PROVIDER_SITE_OTHER): Payer: Self-pay | Admitting: Internal Medicine

## 2022-12-28 NOTE — Telephone Encounter (Signed)
Post Ed Follow-Up    Post ED Follow-Up:   Document completed and/or attempted interactive contact(s) after transition to home after emergency department stay.:   Transition Facility and relevant Date:   Discharge Date: 12/27/22  Discharge from [facility] Emergency Department?: Yes  Discharge Facility: [facility]  Contacted by: Jobie Quaker RN  Contact method: MyChart Patient Portal  MyChart message sent?: Yes  Medications prescribed: No  Interventions: No needs identified  MyChart message sent re: ED discharge requesting call back for questions/concerns. Advised of Nurse Navigator number and services provided.     Jobie Quaker RN  [employer]

## 2022-12-28 NOTE — Telephone Encounter (Signed)
Per kim  yes  pt notifed

## 2023-01-01 ENCOUNTER — Other Ambulatory Visit (INDEPENDENT_AMBULATORY_CARE_PROVIDER_SITE_OTHER): Payer: Self-pay | Admitting: Internal Medicine

## 2023-01-01 ENCOUNTER — Other Ambulatory Visit: Payer: Self-pay

## 2023-01-01 MED ORDER — HYDROXYZINE PAMOATE 50 MG CAPSULE
50.0000 mg | ORAL_CAPSULE | Freq: Three times a day (TID) | ORAL | 1 refills | Status: DC | PRN
Start: 2023-01-01 — End: 2023-02-22

## 2023-01-01 MED ORDER — LISINOPRIL 5 MG TABLET
5.0000 mg | ORAL_TABLET | Freq: Every day | ORAL | 2 refills | Status: DC
Start: 2023-01-01 — End: 2023-01-05

## 2023-01-01 MED ORDER — HYDROXYZINE PAMOATE 25 MG CAPSULE
25.0000 mg | ORAL_CAPSULE | Freq: Three times a day (TID) | ORAL | 0 refills | Status: DC | PRN
Start: 2023-01-01 — End: 2023-01-01
  Filled 2023-01-01: qty 30, 10d supply, fill #0

## 2023-01-05 ENCOUNTER — Other Ambulatory Visit: Payer: Self-pay

## 2023-01-05 ENCOUNTER — Other Ambulatory Visit (INDEPENDENT_AMBULATORY_CARE_PROVIDER_SITE_OTHER): Payer: Self-pay | Admitting: Internal Medicine

## 2023-01-07 ENCOUNTER — Encounter (INDEPENDENT_AMBULATORY_CARE_PROVIDER_SITE_OTHER): Payer: Self-pay | Admitting: Internal Medicine

## 2023-01-07 NOTE — Assessment & Plan Note (Signed)
Continue vit d supplement.

## 2023-01-07 NOTE — Progress Notes (Signed)
INTERNAL MEDICINE, BUILDING A  [address]  [place] [address]  Operated by [facility]  Transitional Care Management Note    Name: Sonya Parks MRN:  [id number]   Date: 12/25/2022 Age: 67 y.o.     Chief Complaint: Hospital Follow Up [facility] follow up pt states that she feels better than she did does states that she is having anxiety and that she did take a 0.5 of ativan and it calmed her down and took one this morning at nine states the anxiety happens more in the eveneng and stronger / ) and Hospital Discharge Transition       SUBJECTIVE:  Sonya Parks is a 67 y.o. female presenting today for follow-up after being discharged. The main problem requiring admission was vomiting and diarrhea. And admitted for low  NA    Pt  said  about   6 x at home  x  6  lower    felt hot  on fire   light headed   tingling and burning  in  random places  in  her body    Hr was good  but BP was  165 systolic  Pt went to the  ER  and  BP low  and  iv  fluids  and  pt  had  vomited  x 4 while in  ER   Na  126 and she was DC    pt  was given  BP  med and DC    Cymbalta   x 2  =  60 mg  Thursday  and Friday       PT did vomit again  and diarrhea    last time on  12/8      12/9  Pt was suppose to be on 1500 ml  fluid   restriction     Pt has had  about   64 oz  today  already       Pt says she had to take an ativan  0. 5 yesterday  as the anxiety  was overwhelming   yesterday    Pt went to work today and got  tingling in feet and arms   she had to take another ativan   I am concerned about  ssri and snri   so we will not go back on those   Pt is ok to use the ativan       2.5 yrs ago  she had acute confusion  and low na           Pt before this  has not been having dumping  before this despite hx of SIBO etc    Her bowels have been doing  well     Pt has salt tabs rx  but  has not taken it today  Pt asks when to take them  I do not see any mention of these    Discussed  fu labs  as she has not been on  restriction      Still very concerned  on  how low  her  na  went this time      FU Depression anxiety  pt  says she  was  doing well with her meds   she  did not feel it had anything to do with  changes in labs        OBJECTIVE:   BP 112/80 (Site: Left Arm)   Pulse (!) 106  Temp 36.8 C (98.3 F)   Ht 1.676 m (5\' 6" )   SpO2 98%   BMI 21.14 kg/m      Exam-AMB  Const  General: cooperative, healthy appearing and no acute distress  Orientation/Consciousness: patient oriented x3  HENMT  Ears: hearing grossly normal bilaterally  Eyes  General: appearance normal, both eyes and all related structures  Conjunctivae: conjunctivae normal  Sclera: sclerae normal  EOM: EOM intact bilaterally  Neck  Neck: normal visual inspection and no lymphadenopathy  Thyroid: thyroid normal  Carotids: no bruits  Resp  Effort & Inspection: normal respiratory effort  Auscultation: clear to auscultation bilaterally, no crackles, no rales, no rhonchi and no wheezes  Cardio  Jugular venous pressure: no JVD  Rate: regular rate  Rhythm: regular rhythm  Heart Sounds: S1 normal, S2 normal, no click, no murmurs and no rubs  Bruits: no carotid bruits  GI  Inspection: Yes normal to inspection  Palpation: soft, no hepatosplenomegaly, no guarding, no masses and nontender  Auscultation: normal bowel sounds  Neuro  General: patient oriented x3  Extrem  General: normal to inspection, normal exam except as noted, clubbing, cyanosis or edema noted, normal gait and other  Psych  Mental Status: mental status grossly normal  Mood: congruent mood  Affect: normal affect  Insight: insight good  Judgment: judgment good       Transition of Care Contact Information  Discharge date: Discharge Date: Not Found  Transition Facility Type--Hospital (Inpatient or Observation)  Facility Name--[facility] Interactive Contact(s):  Completed Contact: 12/21/2022  1:22 PM  First Attempt Call: 12/21/2022  1:22 PM  Contact Method(s)-- Patient/Caregiver  Telephone  Clinical Staff Name/Role who contacted--becky     Data Reviewed  Medication Reconciliation completed    Assessment & Plan  Hospital discharge follow-up  Hospital records reviewed  and all  labs reviewed and discussed    Hyponatremia  Pt had severe low na level  which is concerning    Fu labs today    Skin sensation disturbance  Fu vit  levels    Vitamin D deficiency  Continue vit  d supplement    Dry mouth  Will do ana to ro CTS    Loss of appetite  Will monitor  appetite       Other transition actions (Optional) -: Discharge documentation was reviewed, Durable medical equipment is not needed., and [employer] additional services are needed or desired    Meds reviewed as well as labs.  Chart reviewed and updated.   Continue current treatment.  Keep follow-up appointment.   Vaccine hx reviewed.    Hosp records  reviewed   all labs reviewed and discussed    Pt  to have fu labs   as she has not been on a fluid restriction as her DC  instructions  said     Appetite  down  she says she will work at this   No further SSRI or  SNRI    Ativan  prn     On the day of the encounter, a total of  50 minutes was spent on this patient encounter including review of historical information, examination, documentation and post-visit activities.     Samuella Cota, PA-C

## 2023-01-08 ENCOUNTER — Other Ambulatory Visit: Payer: Self-pay

## 2023-01-08 MED ORDER — LISINOPRIL 5 MG TABLET
5.0000 mg | ORAL_TABLET | Freq: Every day | ORAL | 2 refills | Status: DC
Start: 2023-01-08 — End: 2023-04-09
  Filled 2023-01-08: qty 90, 90d supply, fill #0

## 2023-01-18 ENCOUNTER — Other Ambulatory Visit: Payer: Self-pay

## 2023-01-19 ENCOUNTER — Other Ambulatory Visit: Payer: Self-pay

## 2023-01-19 MED ORDER — CREON 24,000-76,000-120,000 UNIT CAPSULE,DELAYED RELEASE
DELAYED_RELEASE_CAPSULE | ORAL | 3 refills | Status: DC
Start: 2023-01-19 — End: 2023-09-19
  Filled 2023-01-19: qty 100, 33d supply, fill #0
  Filled 2023-03-09: qty 100, 33d supply, fill #1
  Filled 2023-04-11: qty 100, 33d supply, fill #2
  Filled 2023-05-17: qty 100, 33d supply, fill #3
  Filled 2023-09-13 – 2023-09-19 (×2): qty 100, 33d supply, fill #4

## 2023-01-22 ENCOUNTER — Other Ambulatory Visit: Payer: Self-pay

## 2023-02-08 ENCOUNTER — Other Ambulatory Visit: Payer: Self-pay

## 2023-02-08 ENCOUNTER — Encounter (INDEPENDENT_AMBULATORY_CARE_PROVIDER_SITE_OTHER): Payer: Self-pay | Admitting: Internal Medicine

## 2023-02-08 ENCOUNTER — Ambulatory Visit: Payer: No Typology Code available for payment source | Attending: Internal Medicine | Admitting: Internal Medicine

## 2023-02-08 VITALS — BP 112/68 | HR 105

## 2023-02-08 DIAGNOSIS — E871 Hypo-osmolality and hyponatremia: Secondary | ICD-10-CM | POA: Insufficient documentation

## 2023-02-08 DIAGNOSIS — F411 Generalized anxiety disorder: Secondary | ICD-10-CM | POA: Insufficient documentation

## 2023-02-08 DIAGNOSIS — R748 Abnormal levels of other serum enzymes: Secondary | ICD-10-CM | POA: Insufficient documentation

## 2023-02-08 DIAGNOSIS — E782 Mixed hyperlipidemia: Secondary | ICD-10-CM | POA: Insufficient documentation

## 2023-02-08 DIAGNOSIS — K638219 Small intestinal bacterial overgrowth, unspecified: Secondary | ICD-10-CM | POA: Insufficient documentation

## 2023-02-08 DIAGNOSIS — R682 Dry mouth, unspecified: Secondary | ICD-10-CM | POA: Insufficient documentation

## 2023-02-08 NOTE — Progress Notes (Signed)
INTERNAL MEDICINE, [facility]  [address]  [place] [address]  Operated by [facility]      Name: Sonya Parks                       Date of Birth: 08/26/1955   MRN:  [id number]                         Date of visit: 02/08/2023     PCP: Samuella Cota, PA-C     Subjective  Sonya Parks is a 68 y.o. year old female who presents for Follow Up 3 Months (Follow up   vestril meds hold for 6 hours  still having issues  voice quieries  talks about medical  it triggers it.    Has flushing.  ? Sodium problems  )   to clinic.  No specialty comments available.   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) 12/07/2022    OA (osteoarthritis) 12/05/2022    Fatigue 10/09/2021    Small intestinal bacterial overgrowth (SIBO) 10/09/2021    B12 deficiency 10/09/2021    Mixed hyperlipidemia 09/21/2021    Esophageal reflux 09/21/2021    Vitamin D deficiency 09/21/2021    Degenerative arthritis 09/21/2021      Current Outpatient Medications   Medication Sig    atorvastatin (LIPITOR) 10 mg Oral Tablet Take 1 Tablet (10 mg total) by mouth Once a day    buPROPion (WELLBUTRIN XL) 300 mg extended release 24 hr tablet TAKE 1 TABLET BY MOUTH EVERY MORNING    busPIRone (BUSPAR) 15 mg Oral Tablet Take 1 Tablet (15 mg total) by mouth Twice daily (Patient taking differently: Take 0.3333 Tablets (5 mg total) by mouth Twice daily)    celecoxib (CELEBREX) 200 mg Oral Capsule TAKE 1 CAPSULE BY MOUTH TWICE DAILY    estradioL (ESTRACE) 0.01 % (0.1 mg/gram) Vaginal Cream Apply a pea-sized amount to urethral opening every night for 1 month.  Then reduce to Monday-Wednesday-Friday dosing as directed    estradioL (VIVELLE-DOT) 0.0375 mg/24 hr Transdermal Patch Semiweekly APPLY 1 PATCH TOPICALLY TO CLEAN, DRY, AND HAIR-FREE AREA OF THE LOWER STOMACH OR UPPER BUTTOCK AREA TWICE A WEEK (TUESDAY &  FRIDAY)    hydrOXYzine pamoate (VISTARIL) 50 mg Oral Capsule Take 1 Capsule (50 mg total) by mouth Three times a day as needed for Itching    lifitegrast (XIIDRA) 5 % Ophthalmic Dropperette Use 1 drop in both eyes twice daily    lipase-protease-amylase (CREON) 24,000 units of lipase Oral Capsule, Delayed Release(E.C.) Take 1 capsule (24,000 units of lipase total) by mouth every morning, 1 capsule at noon, and 1 capsule every evening. Take with meals.    lisinopriL (PRINIVIL) 5 mg Oral Tablet Take 1 Tablet (5 mg total) by mouth Once a day    LORazepam (ATIVAN) 0.5 mg Oral Tablet 1 Tablet (0.5 mg total)    naltrexone 4.5 mg Oral Capsule Take 1 Capsule (4.5 mg total) by mouth Once a day    progesterone micronized (PROMETRIUM) 100 mg Oral Capsule TAKE 1 CAPSULE BY MOUTH ONCE DAILY AT BEDTIME ON DAYS 1-25 OF CALENDAR MONTH    VOQUEZNA 10 mg Oral Tablet Take 1 Tablet (10 mg total) by mouth Once a day        Fu Visit  Needs fu  Labs      Fu GERD  pt has not responded to  PPI  newly on Voquenza    Fu GAD    pt has problems with panic still but some better     FU  Covid  +   Oct   2024         Co tingling in feet  seen by Dr  Sedalia Muta       Mild  CTS  and  no need for fu    Fu Depression anxiety  and  focus  Focus still a problem at times    pt has been on Wellbutrin and Buspar  ativan prn         Fu OA   seen by Dr Tasia Catchings     Seen by  [facility]  in  2021 Dr Marylou Flesher       Fu Urology  Dr Nancy Fetter           Recent  urine   negative    Fu Hip  pain to see  ortho at  [facility]  in  Jan   2025     Dr  Vernell Morgans  [facility]  retiring   but he did her hip in the past and pt will see  his replacement  physician     Fu SIBO    Pt is on rifaxamin and feels it is working       Fu IMMUNOLOGY  Pt seen by  Dr Sherilyn Banker  pt has been getting   vaccines   IGA  low  and  discussed  immunoglobulin  `            REVIEW OF SYSTEMS:   Review of Systems  General: No fever.  No chills.  No weight changes.  HEENT: No vision changes.  Cardiac: No chest pain. No  palpitations.  No dizziness.  No light-headedness.  No near syncope.  Resp: No dyspnea at rest, no dyspnea on exertion; no cough or hemoptysis; no orthopnea or PND.  GI: No N/V. No melena.  No bright red blood per rectum.  Ext: No edema.  No claudication.  Neuro: No focal weakness.  No numbness.  All other ROS negative.      Objective:   BP 112/68 (Site: Left Arm, Patient Position: Sitting)   Pulse (!) 105   SpO2 98%              PHYSICAL EXAM  Physical Exam  Gen: NAD. Alert.   HEENT: PERRL; conjunctivae clear. No JVD or carotid bruit.  Cardiac: RRR with normal S1, S2.   Lungs: Clear to auscultation bilaterally. No rales. No wheezing. No rhonchi.  Abdomen: Soft, non-tender.non-distended  nl bowel sounds    Extremities: No edema. No cyanosis. No clubbing.  Neurologic:  Grossly intact    Lab Results   Component Value Date    CHOLESTEROL 149 12/23/2022    HDLCHOL 67 12/23/2022    LDLCHOL 70 12/23/2022    TRIG 61 12/23/2022       COMPLETE BLOOD COUNT   Lab Results   Component Value Date    WBC 8.2 12/27/2022    HGB 14.7 12/27/2022    HCT 42.4 12/27/2022    PLTCNT 258 12/27/2022       DIFFERENTIAL  Lab Results   Component Value Date    PMNS 79 (H) 12/27/2022    LYMPHOCYTES 12 (L) 12/27/2022    MONOCYTES 8 12/27/2022  EOSINOPHIL 1 12/27/2022    BASOPHILS 0 12/27/2022    BASOPHILS 0.02 12/27/2022    PMNABS 6.49 12/27/2022    LYMPHSABS 0.97 (L) 12/27/2022    EOSABS 0.06 12/27/2022    MONOSABS 0.65 12/27/2022        COMPREHENSIVE METABOLIC PANEL  Lab Results   Component Value Date    SODIUM 141 12/27/2022    POTASSIUM 4.7 12/27/2022    CHLORIDE 105 12/27/2022    CO2 29 12/27/2022    ANIONGAP 7 12/27/2022    BUN 18 12/27/2022    CREATININE 0.83 12/27/2022    GLUCOSENF 106 12/27/2022    GLUCOSE Negative 12/27/2022    CALCIUM 9.0 12/27/2022    ALBUMIN 3.9 12/27/2022    TOTALPROTEIN 6.8 12/27/2022    ALKPHOS 73 12/27/2022    AST 25 12/27/2022    ALT 62 12/27/2022    GFR 78 12/27/2022                THYROID STIMULATING  HORMONE  Lab Results   Component Value Date    TSH 2.352 12/23/2022             Assessment & Plan  Mixed hyperlipidemia    Hyponatremia    Dry mouth    GAD (generalized anxiety disorder)    Small intestinal bacterial overgrowth (SIBO)    Elevated CPK         Meds reviewed as well as labs.  Chart reviewed and updated.   Continue current treatment.  Keep follow-up appointment.   Vaccine hx reviewed.   Continue   rifaxamin     Labs in follow up    Will get consultant note     Overall pt is improving  and  feeling better  Continue  present tx

## 2023-02-09 ENCOUNTER — Other Ambulatory Visit: Payer: Self-pay

## 2023-02-09 LAB — CBC WITH DIFF
BASOPHIL #: 0.1 10*3/uL (ref 0.00–0.10)
BASOPHIL %: 1 % (ref 0–1)
EOSINOPHIL #: 0.1 10*3/uL (ref 0.00–0.50)
EOSINOPHIL %: 1 % (ref 1–7)
HCT: 40.7 % (ref 31.2–41.9)
HGB: 14.1 g/dL (ref 10.9–14.3)
LYMPHOCYTE #: 0.9 10*3/uL — ABNORMAL LOW (ref 1.10–3.10)
LYMPHOCYTE %: 11 % — ABNORMAL LOW (ref 16–46)
MCH: 31.4 pg (ref 24.7–32.8)
MCHC: 34.7 g/dL (ref 32.3–35.6)
MCV: 90.5 fL (ref 75.5–95.3)
MONOCYTE #: 0.5 10*3/uL (ref 0.20–0.90)
MONOCYTE %: 6 % (ref 4–11)
MPV: 7.6 fL — ABNORMAL LOW (ref 7.9–10.8)
NEUTROPHIL #: 6.3 10*3/uL (ref 1.90–8.20)
NEUTROPHIL %: 80 % — ABNORMAL HIGH (ref 43–77)
PLATELETS: 267 10*3/uL (ref 140–440)
RBC: 4.49 10*6/uL (ref 3.63–4.92)
RDW: 13.9 % (ref 12.3–17.7)
WBC: 7.9 10*3/uL (ref 3.8–11.8)

## 2023-02-09 LAB — CREATINE KINASE (CK), TOTAL, SERUM OR PLASMA: CREATINE KINASE: 176 U/L (ref 30–223)

## 2023-02-09 LAB — LIPID PANEL
CHOL/HDL RATIO: 2.2
CHOLESTEROL: 181 mg/dL (ref <200)
HDL CHOL: 84 mg/dL (ref >=40)
LDL CALC: 84 mg/dL (ref 0–100)
TRIGLYCERIDES: 63 mg/dL (ref <=150)
VLDL CALC: 13 mg/dL (ref 0–50)

## 2023-02-09 LAB — COMPREHENSIVE METABOLIC PNL, FASTING
ALBUMIN/GLOBULIN RATIO: 1.8 — ABNORMAL HIGH (ref 0.8–1.4)
ALBUMIN: 4.4 g/dL (ref 3.5–5.7)
ALKALINE PHOSPHATASE: 47 U/L (ref 34–104)
ALT (SGPT): 36 U/L (ref 7–52)
ANION GAP: 6 mmol/L (ref 4–13)
AST (SGOT): 30 U/L (ref 13–39)
BILIRUBIN TOTAL: 0.7 mg/dL (ref 0.3–1.0)
BUN/CREA RATIO: 14 (ref 6–22)
BUN: 12 mg/dL (ref 7–25)
CALCIUM, CORRECTED: 8.9 mg/dL (ref 8.9–10.8)
CALCIUM: 9.2 mg/dL (ref 8.6–10.3)
CHLORIDE: 99 mmol/L (ref 98–107)
CO2 TOTAL: 28 mmol/L (ref 21–31)
CREATININE: 0.84 mg/dL (ref 0.60–1.30)
ESTIMATED GFR: 77 mL/min/{1.73_m2} (ref >59)
GLOBULIN: 2.5 (ref 2.0–3.5)
GLUCOSE: 94 mg/dL (ref 74–109)
OSMOLALITY, CALCULATED: 266 mosm/kg — ABNORMAL LOW (ref 270–290)
POTASSIUM: 4.1 mmol/L (ref 3.5–5.1)
PROTEIN TOTAL: 6.9 g/dL (ref 6.4–8.9)
SODIUM: 133 mmol/L — ABNORMAL LOW (ref 136–145)

## 2023-02-09 LAB — THYROID STIMULATING HORMONE (SENSITIVE TSH): TSH: 1.096 u[IU]/mL (ref 0.450–5.330)

## 2023-02-09 LAB — MAGNESIUM: MAGNESIUM: 2.4 mg/dL (ref 1.9–2.7)

## 2023-02-12 ENCOUNTER — Other Ambulatory Visit (INDEPENDENT_AMBULATORY_CARE_PROVIDER_SITE_OTHER): Payer: Self-pay | Admitting: Internal Medicine

## 2023-02-12 ENCOUNTER — Other Ambulatory Visit: Payer: Self-pay

## 2023-02-12 DIAGNOSIS — E871 Hypo-osmolality and hyponatremia: Secondary | ICD-10-CM

## 2023-02-16 ENCOUNTER — Other Ambulatory Visit: Payer: Self-pay

## 2023-02-22 ENCOUNTER — Other Ambulatory Visit (INDEPENDENT_AMBULATORY_CARE_PROVIDER_SITE_OTHER): Payer: Self-pay | Admitting: Internal Medicine

## 2023-02-22 ENCOUNTER — Ambulatory Visit: Payer: No Typology Code available for payment source | Attending: Internal Medicine

## 2023-02-22 ENCOUNTER — Other Ambulatory Visit: Payer: Self-pay

## 2023-02-22 ENCOUNTER — Ambulatory Visit (INDEPENDENT_AMBULATORY_CARE_PROVIDER_SITE_OTHER): Payer: Self-pay | Admitting: Internal Medicine

## 2023-02-22 DIAGNOSIS — E871 Hypo-osmolality and hyponatremia: Secondary | ICD-10-CM | POA: Insufficient documentation

## 2023-02-22 LAB — SODIUM: SODIUM: 133 mmol/L — ABNORMAL LOW (ref 136–145)

## 2023-02-22 MED ORDER — HYDROXYZINE PAMOATE 50 MG CAPSULE
50.0000 mg | ORAL_CAPSULE | Freq: Three times a day (TID) | ORAL | 2 refills | Status: DC
Start: 2023-02-22 — End: 2023-04-09
  Filled 2023-02-22: qty 270, 90d supply, fill #0

## 2023-03-08 ENCOUNTER — Other Ambulatory Visit: Payer: Self-pay

## 2023-03-08 ENCOUNTER — Encounter (INDEPENDENT_AMBULATORY_CARE_PROVIDER_SITE_OTHER): Payer: Self-pay | Admitting: Internal Medicine

## 2023-03-08 ENCOUNTER — Ambulatory Visit: Payer: No Typology Code available for payment source | Attending: Internal Medicine | Admitting: Internal Medicine

## 2023-03-08 VITALS — BP 130/78 | HR 99 | Temp 98.3°F | Ht 66.0 in | Wt 127.0 lb

## 2023-03-08 DIAGNOSIS — F411 Generalized anxiety disorder: Secondary | ICD-10-CM | POA: Insufficient documentation

## 2023-03-08 DIAGNOSIS — K219 Gastro-esophageal reflux disease without esophagitis: Secondary | ICD-10-CM | POA: Insufficient documentation

## 2023-03-08 DIAGNOSIS — I959 Hypotension, unspecified: Secondary | ICD-10-CM | POA: Insufficient documentation

## 2023-03-08 DIAGNOSIS — K638219 Small intestinal bacterial overgrowth, unspecified: Secondary | ICD-10-CM | POA: Insufficient documentation

## 2023-03-08 DIAGNOSIS — F330 Major depressive disorder, recurrent, mild: Secondary | ICD-10-CM | POA: Insufficient documentation

## 2023-03-08 DIAGNOSIS — R5383 Other fatigue: Secondary | ICD-10-CM | POA: Insufficient documentation

## 2023-03-08 MED ORDER — VOQUEZNA 20 MG TABLET
1.0000 | ORAL_TABLET | Freq: Every day | ORAL | 2 refills | Status: DC
Start: 2023-03-08 — End: 2023-03-13
  Filled 2023-03-08 – 2023-03-12 (×3): qty 90, 90d supply, fill #0

## 2023-03-08 NOTE — Progress Notes (Unsigned)
 INTERNAL MEDICINE, [facility] A  [address]  [place] [address]  Operated by [facility]      Name: Sonya Parks                       Date of Birth: Jul 24, 1955   MRN:  [id number]                         Date of visit: 03/08/2023     PCP: Samuella Cota, PA-C     Subjective  Sonya Parks is a 68 y.o. year old female who presents for Follow Up (4 week follow up per kim )   to clinic.  No specialty comments available.   Patient Active Problem List    Diagnosis Date Noted   . GAD (generalized anxiety disorder) 12/07/2022   . 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 OA (osteoarthritis) 12/05/2022   . Fatigue 10/09/2021   . Small intestinal bacterial overgrowth (SIBO) 10/09/2021   . B12 deficiency 10/09/2021   . Mixed hyperlipidemia 09/21/2021   . Esophageal reflux 09/21/2021   . Vitamin D deficiency 09/21/2021   . Degenerative arthritis 09/21/2021      Current Outpatient Medications   Medication Sig   . atorvastatin (LIPITOR) 10 mg Oral Tablet Take 1 Tablet (10 mg total) by mouth Once a day   . buPROPion (WELLBUTRIN XL) 300 mg extended release 24 hr tablet TAKE 1 TABLET BY MOUTH EVERY MORNING   . busPIRone (BUSPAR) 15 mg Oral Tablet Take 1 Tablet (15 mg total) by mouth Twice daily (Patient taking differently: Take 0.3333 Tablets (5 mg total) by mouth Twice daily)   . celecoxib (CELEBREX) 200 mg Oral Capsule TAKE 1 CAPSULE BY MOUTH TWICE DAILY   . estradioL (ESTRACE) 0.01 % (0.1 mg/gram) Vaginal Cream Apply a pea-sized amount to urethral opening every night for 1 month.  Then reduce to Monday-Wednesday-Friday dosing as directed   . estradioL (VIVELLE-DOT) 0.0375 mg/24 hr Transdermal Patch Semiweekly APPLY 1 PATCH TOPICALLY TO CLEAN, DRY, AND HAIR-FREE AREA OF THE LOWER STOMACH OR UPPER BUTTOCK AREA TWICE A WEEK (TUESDAY & FRIDAY)   . hydrOXYzine pamoate (VISTARIL) 50 mg Oral Capsule Take 1 Capsule (50 mg total) by mouth Three times a day   .  lifitegrast (XIIDRA) 5 % Ophthalmic Dropperette Use 1 drop in both eyes twice daily   . lipase-protease-amylase (CREON) 24,000 units of lipase Oral Capsule, Delayed Release(E.C.) Take 1 capsule (24,000 units of lipase total) by mouth every morning, 1 capsule at noon, and 1 capsule every evening. Take with meals.   Marland Kitchen lisinopriL (PRINIVIL) 5 mg Oral Tablet Take 1 Tablet (5 mg total) by mouth Once a day   . LORazepam (ATIVAN) 0.5 mg Oral Tablet 1 Tablet (0.5 mg total)   . naltrexone 4.5 mg Oral Capsule Take 1 Capsule (4.5 mg total) by mouth Once a day   . progesterone micronized (PROMETRIUM) 100 mg Oral Capsule TAKE 1 CAPSULE BY MOUTH ONCE DAILY AT BEDTIME ON DAYS 1-25 OF CALENDAR MONTH   . VOQUEZNA 10 mg Oral Tablet Take 1 Tablet (10 mg total) by mouth Once a day        Fu Visit     Fu low  BP  has had  120-  130 BP  and  occ  90 systolic   and felt  weaker  all day  when BP  was low   Each day she has been feeling better and stronger       [facility]  she needs fu visit for refill on HRT and a compound med  She will bring them the next time   Pt follows at [facility] but it is hard to get there       Fu GERD  pt has not responded to  PPI  newly on Voquenza will increase  to  20 mg      Fu GAD    pt has problems with panic still but some better  using vistaril  bid  than to one per day and  none in last  week   Each week has  been doing better       FU  Covid  +   Oct   2024         Co tingling in feet  seen by Dr  Sedalia Muta       Mild  CTS  and  no need for fu    Fu Depression anxiety  and  focus  Focus still a problem at times    pt has been on Wellbutrin and Buspar  Pt has been using vistaril tid than to prn  and now has not had to have but  2 in the last week  It seems that her anxiety flair up is better overall           Fu OA   seen by Dr Tasia Catchings This     Seen by  [facility]  in  2021 Dr Marylou Flesher       Fu Urology  Dr Nancy Fetter           Recent  urine   negative    Fu Hip  pain to see  ortho at  [facility]  in  Jan   2025      Dr  Vernell Morgans  [facility]  retiring   but he did her hip in the past and pt will see  his replacement  physician     Fu SIBO    Pt is on rifaxamin and feels it is working       Fu constipation  half to whole cup of Miralax          Fu IMMUNOLOGY  Pt seen by  Dr Sherilyn Banker  pt has been getting   vaccines   IGA  low  and  discussed  immunoglobulin  `              REVIEW OF SYSTEMS:   Review of Systems  General: No fever.  No chills.  No weight changes.  HEENT: No vision changes.  Cardiac: No chest pain. No palpitations.  No dizziness.  No light-headedness.  No near syncope.  Resp: No dyspnea at rest, no dyspnea on exertion; no cough or hemoptysis; no orthopnea or PND.  GI: No N/V. No melena.  No bright red blood per rectum.  Ext: No edema.  No claudication.  Neuro: No focal weakness.  No numbness.  All other ROS negative.      Objective: BP 130/78 (Site: Left Arm, Patient Position: Sitting, Cuff Size: Adult)   Pulse 99   Temp 36.8 C (98.3 F)   Ht 1.676 m (5\' 6" )   Wt 57.6 kg (127 lb)   SpO2 100%   BMI 20.50 kg/m                PHYSICAL  EXAM  Physical Exam  Gen: NAD. Alert.   HEENT: PERRL; conjunctivae clear. No JVD or carotid bruit.  Cardiac: RRR with normal S1, S2.   Lungs: Clear to auscultation bilaterally. No rales. No wheezing. No rhonchi.  Abdomen: Soft, non-tender.non-distended  nl bowel sounds      Lab Results   Component Value Date    CHOLESTEROL 181 02/08/2023    HDLCHOL 84 02/08/2023    LDLCHOL 84 02/08/2023    TRIG 63 02/08/2023       COMPLETE BLOOD COUNT   Lab Results   Component Value Date    WBC 7.9 02/08/2023    HGB 14.1 02/08/2023    HCT 40.7 02/08/2023    PLTCNT 267 02/08/2023       DIFFERENTIAL  Lab Results   Component Value Date    PMNS 80 (H) 02/08/2023    LYMPHOCYTES 11 (L) 02/08/2023    MONOCYTES 6 02/08/2023    EOSINOPHIL 1 02/08/2023    BASOPHILS 1 02/08/2023    BASOPHILS 0.10 02/08/2023    PMNABS 6.30 02/08/2023    LYMPHSABS 0.90 (L) 02/08/2023    EOSABS 0.10 02/08/2023    MONOSABS 0.50 02/08/2023         COMPREHENSIVE METABOLIC PANEL  Lab Results   Component Value Date    SODIUM 133 (L) 02/22/2023    POTASSIUM 4.1 02/08/2023    CHLORIDE 99 02/08/2023    CO2 28 02/08/2023    ANIONGAP 6 02/08/2023    BUN 12 02/08/2023    CREATININE 0.84 02/08/2023    GLUCOSENF 94 02/08/2023    GLUCOSE Negative 12/27/2022    CALCIUM 9.2 02/08/2023    ALBUMIN 4.4 02/08/2023    TOTALPROTEIN 6.9 02/08/2023    ALKPHOS 47 02/08/2023    AST 30 02/08/2023    ALT 36 02/08/2023    GFR 77 02/08/2023                THYROID STIMULATING HORMONE  Lab Results   Component Value Date    TSH 1.096 02/08/2023          No orders of the defined types were placed in this encounter.     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  Chart reviewed and updated.   Continue current treatment.  Keep follow-up appointment.   Vaccine hx reviewed.   Continue vistaril prn    Fu in 4 weeks    Increase voquenza  to   20 mg daily    Overall patients condition much improved   Will go over HRT and compound med when she brings them in next month

## 2023-03-09 ENCOUNTER — Other Ambulatory Visit: Payer: Self-pay

## 2023-03-11 ENCOUNTER — Encounter (INDEPENDENT_AMBULATORY_CARE_PROVIDER_SITE_OTHER): Payer: Self-pay | Admitting: Internal Medicine

## 2023-03-12 ENCOUNTER — Other Ambulatory Visit: Payer: Self-pay

## 2023-03-13 ENCOUNTER — Other Ambulatory Visit (INDEPENDENT_AMBULATORY_CARE_PROVIDER_SITE_OTHER): Payer: Self-pay | Admitting: Internal Medicine

## 2023-03-13 ENCOUNTER — Other Ambulatory Visit: Payer: Self-pay

## 2023-03-13 MED ORDER — VOQUEZNA 20 MG TABLET
1.0000 | ORAL_TABLET | Freq: Every day | ORAL | 2 refills | Status: DC
Start: 2023-03-13 — End: 2023-04-09
  Filled 2023-03-13 – 2023-03-27 (×3): qty 90, 90d supply, fill #0

## 2023-03-21 ENCOUNTER — Other Ambulatory Visit: Payer: Self-pay

## 2023-03-21 MED ORDER — XIFAXAN 550 MG TABLET
550.0000 mg | ORAL_TABLET | Freq: Three times a day (TID) | ORAL | 3 refills | Status: AC
Start: 2023-03-21 — End: ?
  Filled 2023-03-21 – 2023-04-16 (×6): qty 90, 30d supply, fill #0
  Filled 2023-04-23: qty 84, 28d supply, fill #0
  Filled 2023-05-17: qty 45, 15d supply, fill #0
  Filled 2023-06-02: qty 45, 15d supply, fill #1
  Filled 2023-08-06: qty 45, 15d supply, fill #2
  Filled 2023-09-13: qty 45, 15d supply, fill #3
  Filled 2023-10-02: qty 45, 15d supply, fill #4
  Filled 2023-10-16: qty 45, 15d supply, fill #5
  Filled 2023-11-03: qty 45, 15d supply, fill #6
  Filled 2023-11-21: qty 45, 15d supply, fill #7

## 2023-03-23 ENCOUNTER — Other Ambulatory Visit: Payer: Self-pay

## 2023-03-27 ENCOUNTER — Other Ambulatory Visit: Payer: Self-pay

## 2023-04-09 ENCOUNTER — Encounter (INDEPENDENT_AMBULATORY_CARE_PROVIDER_SITE_OTHER): Payer: Self-pay | Admitting: Internal Medicine

## 2023-04-09 ENCOUNTER — Other Ambulatory Visit: Payer: Self-pay

## 2023-04-09 ENCOUNTER — Ambulatory Visit: Payer: Self-pay | Attending: Internal Medicine | Admitting: Internal Medicine

## 2023-04-09 VITALS — BP 138/82 | HR 89 | Ht 66.0 in | Wt 130.0 lb

## 2023-04-09 DIAGNOSIS — M1990 Unspecified osteoarthritis, unspecified site: Secondary | ICD-10-CM | POA: Insufficient documentation

## 2023-04-09 DIAGNOSIS — K63829 Intestinal methanogen overgrowth, unspecified: Secondary | ICD-10-CM | POA: Insufficient documentation

## 2023-04-09 DIAGNOSIS — F411 Generalized anxiety disorder: Secondary | ICD-10-CM | POA: Insufficient documentation

## 2023-04-09 DIAGNOSIS — F410 Panic disorder [episodic paroxysmal anxiety] without agoraphobia: Secondary | ICD-10-CM | POA: Insufficient documentation

## 2023-04-09 DIAGNOSIS — F330 Major depressive disorder, recurrent, mild: Secondary | ICD-10-CM | POA: Insufficient documentation

## 2023-04-09 DIAGNOSIS — K638219 Small intestinal bacterial overgrowth, unspecified: Secondary | ICD-10-CM | POA: Insufficient documentation

## 2023-04-09 DIAGNOSIS — K219 Gastro-esophageal reflux disease without esophagitis: Secondary | ICD-10-CM | POA: Insufficient documentation

## 2023-04-09 MED ORDER — METRONIDAZOLE 500 MG TABLET
500.0000 mg | ORAL_TABLET | Freq: Three times a day (TID) | ORAL | 0 refills | Status: AC
Start: 2023-04-09 — End: 2023-05-10
  Filled 2023-04-09: qty 90, 30d supply, fill #0

## 2023-04-09 NOTE — Progress Notes (Signed)
 INTERNAL MEDICINE, [facility]  [address]  [place] [address]  Operated by [facility]  History and Physical    Name: Sonya Parks MRN:  [id number]   Date: 04/09/2023 DOB:  1955-01-21 (68 y.o.)         Name: Sonya Parks                       Date of Birth: Jul 03, 1955   MRN:  [id number]                         Date of visit: 04/09/2023     PCP: Tia Flowers, PA-C     Subjective  Sonya Parks is a 68 y.o. year old female who presents for Follow Up (Follow up pt states that the voqenza makes her itch so she stopped it and the itching stopped she tried different ways but still had the itching. Would like something else  she did bring in the other medication for you to look at )   to clinic.  No specialty comments available.   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) 12/07/2022    OA (osteoarthritis) 12/05/2022    Fatigue 10/09/2021    Small intestinal bacterial overgrowth (SIBO) 10/09/2021    B12 deficiency 10/09/2021    Mixed hyperlipidemia 09/21/2021    Esophageal reflux 09/21/2021    Vitamin D  deficiency 09/21/2021    Degenerative arthritis 09/21/2021      Current Outpatient Medications   Medication Sig    atorvastatin  (LIPITOR) 10 mg Oral Tablet Take 1 Tablet (10 mg total) by mouth Once a day    buPROPion  (WELLBUTRIN  XL) 300 mg extended release 24 hr tablet TAKE 1 TABLET BY MOUTH EVERY MORNING    busPIRone  (BUSPAR ) 15 mg Oral Tablet Take 1 Tablet (15 mg total) by mouth Twice daily (Patient taking differently: Take 0.3333 Tablets (5 mg total) by mouth Twice daily)    celecoxib  (CELEBREX ) 200 mg Oral Capsule TAKE 1 CAPSULE BY MOUTH TWICE DAILY    estradioL  (ESTRACE ) 0.01 % (0.1 mg/gram) Vaginal Cream Apply a pea-sized amount to urethral opening every night for 1 month.  Then reduce to  Monday-Wednesday-Friday dosing as directed    estradioL  (VIVELLE -DOT) 0.0375 mg/24 hr Transdermal Patch Semiweekly APPLY 1 PATCH TOPICALLY TO CLEAN, DRY, AND HAIR-FREE AREA OF THE LOWER STOMACH OR UPPER BUTTOCK AREA TWICE A WEEK (TUESDAY & FRIDAY)    lifitegrast  (XIIDRA ) 5 % Ophthalmic Dropperette Use 1 drop in both eyes twice daily    lipase -protease -amylase  (CREON ) 24,000 units of lipase  Oral Capsule, Delayed Release(E.C.) Take 1 capsule (24,000 units of lipase  total) by mouth every morning, 1 capsule at noon, and 1 capsule every evening. Take with meals.    LORazepam (ATIVAN) 0.5 mg Oral Tablet 1 Tablet (0.5 mg total)    metroNIDAZOLE  (FLAGYL ) 500 mg Oral Tablet Take 1 Tablet (500 mg total) by mouth Three times a day for 7 days    naltrexone  4.5 mg Oral Capsule Take 1 Capsule (4.5 mg total) by mouth Daily    pantoprazole  (PROTONIX ) 20 mg Oral Tablet, Delayed Release (E.C.)     progesterone  micronized (PROMETRIUM ) 100 mg Oral Capsule TAKE 1 CAPSULE BY MOUTH ONCE DAILY AT  BEDTIME ON DAYS 1-25 OF CALENDAR MONTH    rifAXIMin  (XIFAXAN ) 550 mg Oral Tablet Take 1 Tablet (550 mg total) by mouth Three times a day            Fu  Visit     Fu GERD  pt tried voquenza but ? Itching not on  10 mg  but on the  20 mg    Will go back to Protonix    40 bid  x 1 week than to one daily     Fu SIBO    Seen by Dr Hollingsworthtons   at [facility] rifagut out of [place]   Needs refill on flagyl        Fu jt pain  some better      Fu  rhinitis  some better  but has not had her  Atrovent nasal spray    Fu panic attacks  pt says she has not had one in  about   6 weeks     No further  vistaril   or ativan in  months      Fu ringing in ears  now resolved   pt was on neomycin  and now off   This drug can be ototoxic                  REVIEW OF SYSTEMS:   Review of Systems  Review of Systems have been reviewed    Objective: BP 138/82   Pulse 89   Ht 1.676 m (5\' 6" )   Wt 59 kg (130 lb)   SpO2 99%   BMI 20.98 kg/m                PHYSICAL EXAM  Physical  Exam  Gen: NAD. Alert.   Exam-AMB  Const  General: cooperative, healthy appearing and no acute distress  Orientation/Consciousness: patient oriented x3  HENMT  Ears: hearing grossly normal bilaterally  Eyes  General: appearance normal, both eyes and all related structures  Conjunctivae: conjunctivae normal  Sclera: sclerae normal  EOM: EOM intact bilaterally  Neck  Neck: normal visual inspection and no lymphadenopathy  Thyroid : thyroid  normal  Carotids: no bruits  Resp  Effort & Inspection: normal respiratory effort  Auscultation: clear to auscultation bilaterally, no crackles, no rales, no rhonchi and no wheezes  Cardio  Jugular venous pressure: no JVD  Rate: regular rate  Rhythm: regular rhythm  Heart Sounds: S1 normal, S2 normal, no click, no murmurs and no rubs  Bruits: no carotid bruits  GI  Inspection: Yes normal to inspection  Palpation: soft, no hepatosplenomegaly, no guarding, no masses and nontender  Auscultation: normal bowel sounds  Neuro  General: patient oriented x3  Extrem  General: normal to inspection, normal exam except as noted, clubbing, cyanosis or edema noted, normal gait and other  Psych  Mental Status: mental status grossly normal  Mood: congruent mood  Affect: normal affect  much improved    Insight: insight good  Judgment: judgment good     Orders Placed This Encounter    metroNIDAZOLE  (FLAGYL ) 500 mg Oral Tablet      Assessment & Plan  Small intestinal bacterial overgrowth (SIBO)  Continue on  xifaxan  and flagyl    Intestinal methanogen overgrowth  Flagyl  and xifaxan       Follows with [facility]    Esophageal reflux  ? 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

## 2023-04-09 NOTE — Assessment & Plan Note (Signed)
 Continue on  xifaxan  and flagyl 

## 2023-04-09 NOTE — Assessment & Plan Note (Signed)
?   Allergic to  voquezna      may retry  10 mg in future     Now to start  40 mg Protonix   daily but may need bid

## 2023-04-09 NOTE — Assessment & Plan Note (Signed)
 Stable on Wellbutrin   300 and Buspar   5mg  bid

## 2023-04-09 NOTE — Nursing Note (Signed)
 Follow up pt states that the voqenza makes her itch so she stopped it and the itching stopped she tried different ways but still had the itching. Would like something else  she did bring in the other medication for you to look at

## 2023-04-09 NOTE — Assessment & Plan Note (Signed)
 Stable  no need for atarax  or xanax in >  65mo

## 2023-04-09 NOTE — Assessment & Plan Note (Signed)
 Flagyl  and xifaxan       Follows with [facility]

## 2023-04-09 NOTE — Assessment & Plan Note (Signed)
 Much improved

## 2023-04-10 ENCOUNTER — Other Ambulatory Visit: Payer: Self-pay

## 2023-04-11 ENCOUNTER — Other Ambulatory Visit: Payer: Self-pay

## 2023-04-12 ENCOUNTER — Other Ambulatory Visit: Payer: Self-pay

## 2023-04-13 ENCOUNTER — Other Ambulatory Visit: Payer: Self-pay

## 2023-04-16 ENCOUNTER — Other Ambulatory Visit: Payer: Self-pay

## 2023-04-17 ENCOUNTER — Other Ambulatory Visit: Payer: Self-pay

## 2023-04-23 ENCOUNTER — Other Ambulatory Visit: Payer: Self-pay

## 2023-04-24 ENCOUNTER — Other Ambulatory Visit: Payer: Self-pay

## 2023-05-16 ENCOUNTER — Other Ambulatory Visit: Payer: Self-pay

## 2023-05-16 ENCOUNTER — Encounter (INDEPENDENT_AMBULATORY_CARE_PROVIDER_SITE_OTHER): Payer: Self-pay | Admitting: Internal Medicine

## 2023-05-16 ENCOUNTER — Ambulatory Visit: Payer: Self-pay | Attending: Internal Medicine | Admitting: Internal Medicine

## 2023-05-16 VITALS — BP 120/68 | HR 103 | Temp 97.4°F | Ht 66.0 in | Wt 130.0 lb

## 2023-05-16 DIAGNOSIS — H9190 Unspecified hearing loss, unspecified ear: Secondary | ICD-10-CM | POA: Insufficient documentation

## 2023-05-16 DIAGNOSIS — Z0000 Encounter for general adult medical examination without abnormal findings: Secondary | ICD-10-CM | POA: Insufficient documentation

## 2023-05-16 DIAGNOSIS — Z124 Encounter for screening for malignant neoplasm of cervix: Secondary | ICD-10-CM | POA: Insufficient documentation

## 2023-05-16 MED ORDER — BOOSTRIX TDAP 2.5 LF UNIT-8 MCG-5 LF/0.5 ML INTRAMUSCULAR SUSPENSION
0.5000 mL | Freq: Once | INTRAMUSCULAR | 0 refills | Status: AC
Start: 2023-05-16 — End: 2023-05-16
  Filled 2023-05-16: qty 0.5, 1d supply, fill #0

## 2023-05-16 MED ORDER — PANTOPRAZOLE 40 MG TABLET,DELAYED RELEASE
40.0000 mg | DELAYED_RELEASE_TABLET | Freq: Every day | ORAL | 2 refills | Status: DC
  Filled 2023-05-16: qty 90, 90d supply, fill #0
  Filled 2023-08-21: qty 90, 90d supply, fill #1
  Filled 2023-11-18: qty 90, 90d supply, fill #2

## 2023-05-16 MED ORDER — RSVPREF3 ANTIGEN-AS01E ADJUVANT(PF) 120 MCG/0.5 ML IM SUSPENSION, KIT
0.5000 mL | INHALATION_SUSPENSION | Freq: Once | INTRAMUSCULAR | 0 refills | Status: AC
Start: 2023-05-16 — End: 2023-05-16
  Filled 2023-05-16: qty 1, 1d supply, fill #0

## 2023-05-16 NOTE — Nursing Note (Signed)
 Annual wellness exam

## 2023-05-16 NOTE — Progress Notes (Signed)
 INTERNAL MEDICINE, [facility] A  [address]  [place] [address]   Operated by [facility]     Name: Sonya Parks                       Date of Birth: June 17, 1955   MRN:  [id number]                         Date of visit: 05/16/2023     PCP: Tia Flowers, PA-C     Subjective  Sonya Parks is a 68 y.o. year old female who presents for Annual Wellness Exam (Annual wellness exam )   to clinic.  No specialty comments available.   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) 12/07/2022    OA (osteoarthritis) 12/05/2022    Fatigue 10/09/2021    Small intestinal bacterial overgrowth (SIBO) 10/09/2021    B12 deficiency 10/09/2021    Mixed hyperlipidemia 09/21/2021    Esophageal reflux 09/21/2021    Vitamin D  deficiency 09/21/2021    Degenerative arthritis 09/21/2021      Current Outpatient Medications   Medication Sig    atorvastatin  (LIPITOR) 10 mg Oral Tablet Take 1 Tablet (10 mg total) by mouth Once a day    buPROPion  (WELLBUTRIN  XL) 300 mg extended release 24 hr tablet TAKE 1 TABLET BY MOUTH EVERY MORNING    busPIRone  (BUSPAR ) 15 mg Oral Tablet Take 1 Tablet (15 mg total) by mouth Twice daily (Patient taking differently: Take 0.3333 Tablets (5 mg total) by mouth Twice daily)    celecoxib  (CELEBREX ) 200 mg Oral Capsule TAKE 1 CAPSULE BY MOUTH TWICE DAILY    estradioL  (ESTRACE ) 0.01 % (0.1 mg/gram) Vaginal Cream Apply a pea-sized amount to urethral opening every night for 1 month.  Then reduce to Monday-Wednesday-Friday dosing as directed    estradioL  (VIVELLE -DOT) 0.0375 mg/24 hr Transdermal Patch Semiweekly APPLY 1 PATCH TOPICALLY TO CLEAN, DRY, AND HAIR-FREE AREA OF THE LOWER STOMACH OR UPPER BUTTOCK AREA TWICE A WEEK (TUESDAY & FRIDAY)    lifitegrast  (XIIDRA ) 5 % Ophthalmic Dropperette Use 1 drop in  both eyes twice daily    lipase -protease -amylase  (CREON ) 24,000 units of lipase  Oral Capsule, Delayed Release(E.C.) Take 1 capsule (24,000 units of lipase  total) by mouth every morning, 1 capsule at noon, and 1 capsule every evening. Take with meals.    LORazepam (ATIVAN) 0.5 mg Oral Tablet 1 Tablet (0.5 mg total)    MIEBO, PF, 100 % Ophthalmic Drops     naltrexone  4.5 mg Oral Capsule Take 1 Capsule (4.5 mg total) by mouth Daily    pantoprazole  (PROTONIX ) 20 mg Oral Tablet, Delayed Release (E.C.)     progesterone  micronized (PROMETRIUM ) 100 mg Oral Capsule TAKE 1 CAPSULE BY MOUTH ONCE DAILY AT BEDTIME ON DAYS 1-25 OF CALENDAR MONTH    rifAXIMin  (XIFAXAN ) 550 mg Oral Tablet Take 1 Tablet (550 mg total) by mouth Three times a day        HPI  This 68 yo is here for annual exam   REVIEW OF SYSTEMS:   Review of Systems  Review of Systems have been reviewed    Objective: BP (!) 140/90 (Site: Left Arm, Patient Position: Sitting)   Pulse (!) 103  Temp 36.3 C (97.4 F)   Ht 1.676 m (5\' 6" )   Wt 59 kg (130 lb)   SpO2 98%   BMI 20.98 kg/m                PHYSICAL EXAM  Physical Exam  Gen: NAD. Alert.   Exam-AMB  Const  General: cooperative, healthy appearing, no acute distress and alert  Orientation/Consciousness: patient oriented x3  HENMT  Ears: hearing grossly normal bilaterally, TM normal on the right and TM normal on the left  General nose exam: nares normal and nasal mucous membranes and turbinates normal  Face and sinus: sinuses nontender  Eyes  Visual Fields: normal visual fields by confrontation  Alignment and Position: alignment normal  Conjunctivae: conjunctivae normal  Sclera: sclerae normal  Pupils: PERRL  EOM: EOM intact bilaterally  Neck  Neck: normal visual inspection and no lymphadenopathy  Thyroid : thyroid  normal  Chest  Chest: normal inspection of the chest  Breast/Axilla Inspection: normal inspection of the breasts  Breast/Axilla Palpation: normal palpation of the breasts  Resp  Effort & Inspection:  normal respiratory effort and no respiratory distress  Auscultation: clear to auscultation bilaterally, no crackles, no rales, no rhonchi, no wheezes and no rubs  Cardio  Jugular venous pressure: no JVD  Rate: regular rate  Rhythm: regular rhythm  Heart Sounds: S1 normal, S2 normal, no click, no murmurs and no rubs  Bruits: no carotid bruits  Pulses: normal peripheral pulses  GI  Inspection: Yes normal to inspection  Palpation: soft, no hepatosplenomegaly and nontender  Percussion: normal to percussion  Auscultation: normal bowel sounds  Rectal Exam - female: normal sphincter tone, No abnormal sphincter tone and heme negative stool  GU  External Female Exam: normal external appearance and normal appearance of the urethra  Urethra: normal appearance of the urethra  Speculum Exam - Vagina: normal appearance of the vagina and normal vaginal discharge  Speculum Exam - Cervix: normal appearance of the cervix and nontender  Bimanual Exam- Vagina & Uterus: normal bimanual exam, uterine mobility normal, No tender and non-tender  Bimanual Exam- Adnexa, other: normal adnexae, no masses, No rectocele, No enterocele and No cystocele  Pelvic Support: no cystocele noted, no enterocele noted and no retocele noted  Musc  Cervical Spine: cervical ROM normal, no cervical muscular tenderness, no pain with cervical ROM and no cervical spinal tenderness  Thoracic/Lumbar Spine: thoracic and lumbar spine normal to inspection, thoraco-lumbar ROM normal, no kyphosis, no scoliosis, no thoracic spinal tenderness and no lumbar spinal tenderness  Skin  Lesions: no lesions  Rashes: no rashes  Neuro  General: patient oriented x3 and gait normal  Cranial Nerves: PERRL  Extrem  General: normal to inspection, no clubbing, no cyanosis and no edema  Psych  Mental Status: mental status grossly normal  Speech and Movement: speech and movement normal  Mood: congruent mood  Affect: normal affect  Attitude: cooperative  Insight: insight good  Judgment:  judgment good     Assessment/Plan  Assessment/Plan   1. Encounter for annual health examination          Importance of medication adherence discussed.    Meds reviewed as well as labs.  Chart reviewed and updated.   Continue current treatment.  Keep follow-up appointment.   Vaccine hx reviewed.    Pap sent   Labs ordered

## 2023-05-17 ENCOUNTER — Other Ambulatory Visit (INDEPENDENT_AMBULATORY_CARE_PROVIDER_SITE_OTHER): Payer: Self-pay | Admitting: Internal Medicine

## 2023-05-17 ENCOUNTER — Ambulatory Visit (INDEPENDENT_AMBULATORY_CARE_PROVIDER_SITE_OTHER): Payer: Self-pay | Admitting: Internal Medicine

## 2023-05-17 ENCOUNTER — Other Ambulatory Visit: Payer: Self-pay

## 2023-05-17 LAB — COMPREHENSIVE METABOLIC PNL, FASTING
ALBUMIN/GLOBULIN RATIO: 1.9 — ABNORMAL HIGH (ref 0.8–1.4)
ALBUMIN: 4.7 g/dL (ref 3.5–5.7)
ALKALINE PHOSPHATASE: 40 U/L (ref 34–104)
ALT (SGPT): 32 U/L (ref 7–52)
ANION GAP: 1 mmol/L — ABNORMAL LOW (ref 4–13)
AST (SGOT): 25 U/L (ref 13–39)
BILIRUBIN TOTAL: 0.6 mg/dL (ref 0.3–1.0)
BUN/CREA RATIO: 22 (ref 6–22)
BUN: 19 mg/dL (ref 7–25)
CALCIUM, CORRECTED: 8.9 mg/dL (ref 8.9–10.8)
CALCIUM: 9.5 mg/dL (ref 8.6–10.3)
CHLORIDE: 108 mmol/L — ABNORMAL HIGH (ref 98–107)
CO2 TOTAL: 30 mmol/L (ref 21–31)
CREATININE: 0.85 mg/dL (ref 0.60–1.30)
ESTIMATED GFR: 76 mL/min/{1.73_m2} (ref >59)
GLOBULIN: 2.5 (ref 2.0–3.5)
GLUCOSE: 97 mg/dL (ref 74–109)
OSMOLALITY, CALCULATED: 280 mosm/kg (ref 270–290)
POTASSIUM: 4.5 mmol/L (ref 3.5–5.1)
PROTEIN TOTAL: 7.2 g/dL (ref 6.4–8.9)
SODIUM: 139 mmol/L (ref 136–145)

## 2023-05-17 LAB — CBC WITH DIFF
BASOPHIL #: 0.1 10*3/uL (ref 0.00–0.10)
BASOPHIL %: 1 % (ref 0–1)
EOSINOPHIL #: 0.1 10*3/uL (ref 0.00–0.50)
EOSINOPHIL %: 1 % (ref 1–7)
HCT: 41.5 % (ref 31.2–41.9)
HGB: 14.2 g/dL (ref 10.9–14.3)
LYMPHOCYTE #: 1 10*3/uL — ABNORMAL LOW (ref 1.10–3.10)
LYMPHOCYTE %: 15 % — ABNORMAL LOW (ref 16–46)
MCH: 30.3 pg (ref 24.7–32.8)
MCHC: 34.2 g/dL (ref 32.3–35.6)
MCV: 88.5 fL (ref 75.5–95.3)
MONOCYTE #: 0.5 10*3/uL (ref 0.20–0.90)
MONOCYTE %: 8 % (ref 4–11)
MPV: 8 fL (ref 7.9–10.8)
NEUTROPHIL #: 5.1 10*3/uL (ref 1.90–8.20)
NEUTROPHIL %: 75 % (ref 43–77)
PLATELETS: 260 10*3/uL (ref 140–440)
RBC: 4.69 10*6/uL (ref 3.63–4.92)
RDW: 13.4 % (ref 12.3–17.7)
WBC: 6.8 10*3/uL (ref 3.8–11.8)

## 2023-05-17 LAB — LIPID PANEL
CHOL/HDL RATIO: 2.1
CHOLESTEROL: 188 mg/dL (ref <200)
HDL CHOL: 89 mg/dL (ref >=40)
LDL CALC: 83 mg/dL (ref 0–100)
TRIGLYCERIDES: 80 mg/dL (ref <=150)
VLDL CALC: 16 mg/dL (ref 0–50)

## 2023-05-17 LAB — THYROID STIMULATING HORMONE (SENSITIVE TSH): TSH: 1.117 u[IU]/mL (ref 0.450–5.330)

## 2023-05-17 MED ORDER — CELECOXIB 200 MG CAPSULE
200.0000 mg | ORAL_CAPSULE | Freq: Two times a day (BID) | ORAL | 2 refills | Status: AC
Start: 2023-05-17 — End: ?
  Filled 2023-05-17: qty 180, 90d supply, fill #0
  Filled 2023-08-21: qty 180, 90d supply, fill #1
  Filled 2023-11-18: qty 180, 90d supply, fill #2

## 2023-05-18 ENCOUNTER — Other Ambulatory Visit: Payer: Self-pay

## 2023-05-19 ENCOUNTER — Encounter (INDEPENDENT_AMBULATORY_CARE_PROVIDER_SITE_OTHER): Payer: Self-pay | Admitting: Internal Medicine

## 2023-05-21 ENCOUNTER — Other Ambulatory Visit: Payer: Self-pay

## 2023-05-22 ENCOUNTER — Other Ambulatory Visit: Payer: Self-pay

## 2023-05-22 LAB — THIN PREP PAP REFLEX TO HPV MRNA IF ASCUS (QUEST)

## 2023-05-23 ENCOUNTER — Other Ambulatory Visit: Payer: Self-pay

## 2023-05-25 ENCOUNTER — Telehealth (INDEPENDENT_AMBULATORY_CARE_PROVIDER_SITE_OTHER): Payer: Self-pay | Admitting: Internal Medicine

## 2023-06-02 ENCOUNTER — Other Ambulatory Visit: Payer: Self-pay

## 2023-06-02 ENCOUNTER — Other Ambulatory Visit (INDEPENDENT_AMBULATORY_CARE_PROVIDER_SITE_OTHER): Payer: Self-pay | Admitting: Internal Medicine

## 2023-06-03 ENCOUNTER — Other Ambulatory Visit: Payer: Self-pay

## 2023-06-05 ENCOUNTER — Other Ambulatory Visit: Payer: Self-pay

## 2023-06-05 MED ORDER — ESTRADIOL 0.0375 MG/24 HR SEMIWEEKLY TRANSDERMAL PATCH
MEDICATED_PATCH | TRANSDERMAL | 4 refills | Status: DC
  Filled 2023-06-05: qty 24, 84d supply, fill #0
  Filled 2023-09-13: qty 16, 56d supply, fill #1

## 2023-06-05 MED ORDER — BUPROPION HCL XL 300 MG 24 HR TABLET, EXTENDED RELEASE
ORAL_TABLET | ORAL | 2 refills | Status: AC
  Filled 2023-06-05: qty 90, 90d supply, fill #0
  Filled 2023-09-13: qty 90, 90d supply, fill #1
  Filled 2023-12-10: qty 90, 90d supply, fill #2

## 2023-06-06 ENCOUNTER — Other Ambulatory Visit: Payer: Self-pay

## 2023-07-09 ENCOUNTER — Other Ambulatory Visit: Payer: Self-pay

## 2023-07-09 MED ORDER — CLINDAMYCIN HCL 150 MG CAPSULE
150.0000 mg | ORAL_CAPSULE | Freq: Four times a day (QID) | ORAL | 1 refills | Status: DC
Start: 2023-07-09 — End: 2023-09-12
  Filled 2023-07-09: qty 40, 10d supply, fill #0

## 2023-07-10 ENCOUNTER — Other Ambulatory Visit: Payer: Self-pay

## 2023-07-11 ENCOUNTER — Telehealth (INDEPENDENT_AMBULATORY_CARE_PROVIDER_SITE_OTHER): Payer: Self-pay | Admitting: Internal Medicine

## 2023-07-31 ENCOUNTER — Telehealth (INDEPENDENT_AMBULATORY_CARE_PROVIDER_SITE_OTHER): Payer: Self-pay | Admitting: Internal Medicine

## 2023-07-31 ENCOUNTER — Other Ambulatory Visit: Payer: Self-pay

## 2023-07-31 MED ORDER — PROGESTERONE MICRONIZED 100 MG CAPSULE
ORAL_CAPSULE | ORAL | 1 refills | Status: DC
  Filled 2023-07-31: qty 90, 90d supply, fill #0
  Filled 2023-11-03: qty 90, 90d supply, fill #1

## 2023-07-31 MED ORDER — NALTREXONE 4.5 MG CAPSULE
1.0000 | ORAL_CAPSULE | Freq: Every day | ORAL | 1 refills | Status: DC
Start: 2023-07-31 — End: 2023-08-09
  Filled 2023-08-01 – 2023-08-06 (×2): qty 90, 90d supply, fill #0

## 2023-07-31 NOTE — Addendum Note (Signed)
 Addended by: LAUNIE IHA on: 07/31/2023 05:24 PM     Modules accepted: Orders

## 2023-08-01 ENCOUNTER — Other Ambulatory Visit: Payer: Self-pay

## 2023-08-01 NOTE — Telephone Encounter (Signed)
 Patient notified

## 2023-08-03 ENCOUNTER — Other Ambulatory Visit: Payer: Self-pay

## 2023-08-06 ENCOUNTER — Other Ambulatory Visit: Payer: Self-pay

## 2023-08-07 ENCOUNTER — Other Ambulatory Visit: Payer: Self-pay

## 2023-08-09 ENCOUNTER — Other Ambulatory Visit (INDEPENDENT_AMBULATORY_CARE_PROVIDER_SITE_OTHER): Payer: Self-pay | Admitting: Internal Medicine

## 2023-08-09 MED ORDER — NALTREXONE 4.5 MG CAPSULE
1.0000 | ORAL_CAPSULE | Freq: Every day | ORAL | 1 refills | Status: DC
Start: 2023-08-09 — End: 2023-10-12

## 2023-08-13 ENCOUNTER — Other Ambulatory Visit (INDEPENDENT_AMBULATORY_CARE_PROVIDER_SITE_OTHER): Payer: Self-pay

## 2023-08-14 ENCOUNTER — Ambulatory Visit: Payer: Self-pay | Attending: Internal Medicine | Admitting: Internal Medicine

## 2023-08-14 ENCOUNTER — Other Ambulatory Visit: Payer: Self-pay

## 2023-08-14 ENCOUNTER — Encounter (INDEPENDENT_AMBULATORY_CARE_PROVIDER_SITE_OTHER): Payer: Self-pay | Admitting: Internal Medicine

## 2023-08-14 DIAGNOSIS — M791 Myalgia, unspecified site: Secondary | ICD-10-CM

## 2023-08-14 DIAGNOSIS — R209 Unspecified disturbances of skin sensation: Secondary | ICD-10-CM

## 2023-08-14 NOTE — Nursing Note (Signed)
 Pt states that she has been having the issue with her feet and legs had done  Testing and was good states she has been trying to narrow down what it is  She thinks that its the atorvastatin  can it be changed or could it be coming from her hip she goes back end of month for xray of hip

## 2023-08-14 NOTE — Progress Notes (Signed)
 [facility]  [address]  [place] [address]  Operated by [facility]  Telephone Visit    Name:  Sonya Parks MRN: [id number]   Date:  08/14/2023 DOB: Jan 15, 1955 (67 y.o.)          The patient/family initiated a request for telephone service.  Verbal consent for this service was obtained from the patient/family.  Reason for audio only:Patient preference    Last office visit in this department: 05/16/2023      Reason for call:Follow Up (Pt states that she has been having the issue with her feet and legs had done/Testing and was good states she has been trying to narrow down what it is/She thinks that its the atorvastatin  can it be changed or could it be coming from her hip she goes back end of month for xray of hip)       Acute telemed  This 68 yo co leg and foot pain   Numbness tingling  pain pins and needles  and she was concerned with  statin   2 mo worse  this  Summer   Pt has been out of the heat      S/p hip xray   3 mo ago and to fu with ortho she has a follow-up hip x-ray coming which may be causing all of her problems.    NCS done and were good          Allergies[1]     atorvastatin  (LIPITOR) 10 mg Oral Tablet, Take 1 Tablet (10 mg total) by mouth Once a day  buPROPion  (WELLBUTRIN  XL) 300 mg extended release 24 hr tablet, TAKE 1 TABLET BY MOUTH EVERY MORNING  busPIRone  (BUSPAR ) 15 mg Oral Tablet, Take 1 Tablet (15 mg total) by mouth Twice daily  celecoxib  (CELEBREX ) 200 mg Oral Capsule, Take 1 Capsule (200 mg total) by mouth Twice daily  clindamycin  (CLEOCIN ) 150 mg Oral Capsule, Take 1 Capsule (150 mg total) by mouth Every 6 hours (Patient taking differently: Take 1 Capsule (150 mg total) by mouth Once, as needed for Other)  estradioL  (ESTRACE ) 0.01 % (0.1 mg/gram) Vaginal Cream, Apply a pea-sized amount to urethral opening every night for 1 month.  Then reduce to Monday-Wednesday-Friday dosing as directed  estradioL  (VIVELLE -DOT) 0.0375 mg/24 hr Transdermal  Patch Semiweekly, APPLY 1 PATCH TOPICALLY TO CLEAN, DRY, AND HAIR-FREE AREA OF THE LOWER STOMACH OR UPPER BUTTOCK AREA TWICE A WEEK (TUESDAY & FRIDAY)  lifitegrast  (XIIDRA ) 5 % Ophthalmic Dropperette, Use 1 drop in both eyes twice daily  lipase -protease -amylase  (CREON ) 24,000 units of lipase  Oral Capsule, Delayed Release(E.C.), Take 1 capsule (24,000 units of lipase  total) by mouth every morning, 1 capsule at noon, and 1 capsule every evening. Take with meals.  LORazepam (ATIVAN) 0.5 mg Oral Tablet, 1 Tablet (0.5 mg total)  MIEBO, PF, 100 % Ophthalmic Drops,   naltrexone  4.5 mg Oral Capsule, Take 1 Capsule (4.5 mg total) by mouth Daily  pantoprazole  (PROTONIX ) 40 mg Oral Tablet, Delayed Release (E.C.), Take 1 Tablet (40 mg total) by mouth Daily  progesterone  micronized (PROMETRIUM ) 100 mg Oral Capsule, TAKE 1 CAPSULE BY MOUTH ONCE DAILY AT BEDTIME ON DAYS 1-25 OF CALENDAR MONTH  rifAXIMin  (XIFAXAN ) 550 mg Oral Tablet, Take 1 Tablet (550 mg total) by mouth Three times a day    No facility-administered medications prior to visit.       ROS     OBJECTIVE:  There were no vitals filed for this visit.  Exam  Const  General: cooperative, no acute distress and alert  Resp  Effort & Inspection: able to speak in complete sentences  Psych  Mental Status: mental status grossly normal  Mood: congruent mood  Affect: normal affect  Attitude: cooperative  Insight: insight good  Judgment: judgment good      ICD-10-CM    1. Myalgia  M79.10       2. Skin sensation disturbance  R20.9           PLAN: Treatment per orders . Call or return to clinic prn if these symptoms worsen or fail to improve as anticipated.  Meds reviewed as well as labs.  Ok to hold Lipitor 2 week hydrate as tolerates due to low-sodium patient is limited if this makes a difference we will consider either Crestor 5 mg or Pravachol  Patient currently has a follow-up hip x-ray and follow up with Orthopedics as this very well could be coming from the hip  Light  stretches discussed and meds were reviewed as well as labs     LOS Determination: Medical Decision Making- Direct audio communication with patient was 5 minutes    Sonya DELENA Asa, PA-C         [1]   Allergies  Allergen Reactions    Bee Venom Protein (Honey Bee) Swelling     hives    Metoprolol  Other Adverse Reaction (Add comment)     Severe fatigue    Penicillins Rash    Berberine      N/v    Garlic     Oregano Oil      N/v     Poison Ivy Extract Hives/ Urticaria

## 2023-08-21 ENCOUNTER — Other Ambulatory Visit: Payer: Self-pay

## 2023-08-22 ENCOUNTER — Other Ambulatory Visit: Payer: Self-pay

## 2023-09-06 NOTE — Progress Notes (Signed)
 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    She reports that her condition has worsened since her last visit 4 months ago, with persistent pain in her left hip that intensifies as the day progresses. The pain radiates down her legs, affecting her side, thigh, shin bone, feet, buttocks, and groin. This discomfort has limited her ability to perform daily activities, necessitating frequent rest periods. She also struggles with carrying and holding weight. She is considering scheduling an x-ray to assess any changes in her condition.     She saw Dr. Parks in 04/2023, who diagnosed her with arthritis and trochanteric bursitis. However, the arthritis was not severe enough to warrant a hip replacement at that time. She was given exercises for both conditions. She underwent a nerve conduction test due to suspected nerve damage in her feet, but the results were normal. She discontinued atorvastatin  (Lipitor) due to its potential to cause muscle cramps, which she thought might be contributing to her foot pain.    She reports severe left groin pain that radiates to her knee medially.  Pain is worse with activity leave with rest.  Decreased range of motion of the left hip.  Now she is having a hard time with daily activities especially after work.  She reports a decreased quality life.    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 Her primary care physician referred her to a hematologist who recommended infusion treatment prior to her hip replacement surgery. She found the infusions beneficial and would like to continue them monthly. She is currently taking rifaximin  twice daily for her SIBO and feels that her condition has improved. She is contemplating retirement next summer and is concerned about the recovery process from potential  hip replacement surgery. She is also considering staying at a rehabilitation facility for a few days post-surgery. She has been performing yoga exercises at home and is interested in continuing this regimen. She is requesting a morning surgery slot due to her tendency towards low blood sugar levels. She is also seeking advice on whether she should request an infusion treatment from her primary care physician or allergist.    Prior to her right hip replacement she needed an infusion of immunoglobulins which she had arranged by her hematologist.  Her hematologist was in   not related to [facility].  Unfortunately the provider she saw has since passed away.    PAST SURGICAL HISTORY:  Right total hip arthroplasty on 11/01/2020.    SOCIAL HISTORY  Exercise: Athletic and involved in physical activities.        Patient Active Problem List    Diagnosis Date Noted   . SOB (shortness of breath) 10/18/2015   . Abdominal pain 10/21/2010     Medical History[1]   Current Medications[2]  Surgical History[3]   Prescriptions Prior to Admission[4]  Allergies[5]   Social History     Tobacco Use   . Smoking status: Former     Current packs/day: 0.00     Average packs/day: 0.5 packs/day for 28.3 years (14.1 ttl pk-yrs)     Types: Cigarettes     Start date: 07/10/1975     Quit date: 10/21/2003     Years since quitting: 19.8   . Smokeless tobacco: Never   Substance Use Topics   . Alcohol  use: No  Family History[6]    Review of Systems  Pertinent items are noted in HPI.    PHYSICAL EXAM  Vitals:    09/06/23 1543   BP: 132/74   Pulse: 102   SpO2: 99%     There is no height or weight on file to calculate BMI.    Constitutional:  68 y.o. female, cooperative, appears well-developed and well-nourished, in no acute distress. Appears stated age  Skin:  On inspection, warm and dry without rashes or lesions  Vascular: Extremities warm and well perfused, good radial pulses bilateral and equal  Neurological:  Sensory and Motor are grossly intact  throughout.  Normal sensory and motor function lateral femoral cutaneous, femoral and sciatic nerves.    Visual Inspection: No acute deformity   Palpation:    Sacroiliac tenderness:  negative   Greater trochanter tenderness: positive - mild   Anterior thigh tenderness: negative  ROM: Extension - 0   Flexion - 90   IR - 10 (painful)   ER - 20 (painful)   Abd - 20 (painful)   Add - 0  ROM did elicit groin pain.  Good ROM with other extremities.   Stability: Normal without instability  Motor:        5/5 ABDuctors       5/5 adductors       5/5 flexors       5/5 extensors       5/5 internal rotators       5/5 external rotators       5/5 quadriceps       5/5 hamstrings  Gait:  negative Trendelenburg  Leg Length Discrep:  0 cm    Xray:  Left Hip in the office today and discussed with the patient, demonstrates bone on bone with subchondral sclerosis, overlying osteophytes, all consistent with osteoarthritis. No acute process. (personal interpretation)    Impression:  1. Primary osteoarthritis of left hip  XR Hip Uni W Or W/O Pelvis 2-3 Vw Left        Assessment & Plan  1. Left hip pain 2/2 OA:  The left hip pain has been progressively worsening, radiating down the leg, thigh, shin bone, feet, buttocks, and groin. Previous x-rays from 05/09/2023 showed borderline severe arthritis with giant bone spurs. Today's x-ray indicates a slight worsening of the condition, with no collapse of the femoral head but reduced joint space. The pain is primarily from the hip joint, although other sources cannot be ruled out. A hip injection was considered but deemed unlikely to provide long-term relief. While a hip replacement may not eliminate all pain, it could significantly improve quality of life. The patient was advised to schedule the surgery at her earliest convenience. The first available date with Dr. Parks is 12/04/2023. She was also advised to contact her primary care physician's office to arrange for immunoglobulin infusions  prior to the surgery. A consent form for a total anterior left hip replacement was signed. Arrangements for post-surgery care were discussed, including the possibility of staying one night in the hospital and the challenges of getting into a rehab facility due to insurance constraints. The patient was encouraged to walk and perform yoga moves at home for recovery. Delon, the surgery scheduler, will call to confirm the details.    2. Immunoglobulin infusion. Will need to coordinate with her Hematology office.  Patient will contact their office.     3. Small intestinal bacterial overgrowth (SIBO):  Currently taking rifaximin  twice a day for  SIBO and reports feeling much improved. Plans to retest her levels before the hip replacement surgery to ensure they are stable. She was advised to contact her primary care physician or allergist to facilitate the referral for immunoglobulin infusions, as her previous hematologist has passed away. Preapproval for these infusions should be sought to ensure they are covered by insurance.     All questions were answered.    Portions of this note were dictated using Pharmacist, hospital.  It has been reviewed for accuracy, but may contain grammatical and clerical errors.    Electronically signed by: Angelita Dayton Cera, PA-C 09/06/2023 3:59 PM               [1]  Past Medical History:  Diagnosis Date   . Anxiety    . Arthritis    . Chronic constipation    . Disorder of sphincter of Oddi    . Food intolerance    . GERD (gastroesophageal reflux disease)    . Irritable bowel syndrome    . Pancreatitis (CMD) [facility]   . Small intestinal bacterial overgrowth    [2]    Current Outpatient Medications:   .  acetaminophen  (TYLENOL ) 650 mg ER tablet, Take 1,300 mg by mouth every 8 (eight) hours as needed (pain)., Disp: , Rfl:   .  aspirin 81 mg EC tablet, Take 81 mg by mouth daily., Disp: 84 tablet, Rfl: 0  .  atorvastatin  (LIPITOR) 10 mg tablet, Take 10 mg by mouth  at bedtime., Disp: , Rfl:   .  buPROPion  (WELLBUTRIN  XL) 300 mg 24 hr tablet, Take 300 mg by mouth daily with lunch., Disp: , Rfl:   .  busPIRone  (BUSPAR ) 15 mg tablet, Take 5 mg by mouth 2 (two) times a day., Disp: , Rfl:   .  calcium carbonate (TUMS EX) 300 mg (750 mg) chewable tablet, as needed., Disp: , Rfl:   .  celecoxib  (CeleBREX ) 200 mg capsule, Take 200 mg by mouth 2 (two) times a day., Disp: , Rfl:   .  conjugated estrogens (PREMARIN) 0.625 mg/gram vaginal cream, Insert 1 g into the vagina daily., Disp: , Rfl:   .  Creon  24,000-76,000 -120,000 unit capsule, Take 1 capsule (24,000 units of lipase  total) by mouth in the morning and 1 capsule at noon and 1 capsule in the evening. Take with meals., Disp: 120 capsule, Rfl: 3  .  diclofenac sodium (VOLTAREN) 1 % gel, Apply 2 g topically 2 (two) times a day as needed., Disp: , Rfl:   .  estradioL  (VIVELLE -DOT) 0.0375 mg/24 hr, Place  on the skin 2 (two) times weekly., Disp: , Rfl:   .  fluticasone propionate (FLONASE) 50 mcg/spray nasal spray, Administer 1 spray into each nostril daily as needed for rhinitis or allergies., Disp: , Rfl:   .  hydrOXYzine  pamoate (VISTARIL ) 25 mg capsule, TAKE 1 CAPSULE BY MOUTH THREE TIMES DAILY AS NEEDED FOR ITCHING, Disp: , Rfl:   .  lidocaine  (LIDODERM ) 5 % patch, 2 patches daily as needed (pain)., Disp: , Rfl:   .  LORazepam (ATIVAN) 0.5 mg tablet, Take 0.5 mg by mouth 2 (two) times a day as needed for anxiety., Disp: , Rfl:   .  magnesium hydroxide (Milk of Magnesia) 400 mg/5 mL suspension, Take 60 mL by mouth nightly., Disp: , Rfl:   .  Miebo, PF, 100 % drop, , Disp: , Rfl:   .  naltrexone  (Naltrex) 4.5 mg cap, Take  by mouth nightly., Disp: , Rfl:   .  pantoprazole  (PROTONIX ) 20 mg EC tablet, , Disp: , Rfl:   .  polyethylene glycol (GLYCOLAX) 17 gram packet, Take 17 g by mouth Once Daily., Disp: , Rfl:   .  progesterone  (PROMETRIUM ) 100 mg cap capsule, Take one capsule at bedtime days 1-25 of calendar month, Disp: , Rfl:   .   Xiidra  5 % dpet ophthalmic solution, Administer 1 drop into each eyes Once Daily., Disp: , Rfl:   [3]  Past Surgical History:  Procedure Laterality Date   . ABDOMINAL SURGERY   2008, 2013    Procedure: ABDOMINAL SURGERY; Gall Bladder. Odie of liver   . APPENDECTOMY      Procedure: APPENDECTOMY   . BREAST AUGMENTATION      Procedure: BREAST IMPLANT PLACEMENT   . BREAST IMPLANT REMOVAL      Procedure: BREAST IMPLANT REMOVAL   . CHOLECYSTECTOMY      Procedure: CHOLECYSTECTOMY   . COLONOSCOPY      Procedure: COLONOSCOPY   . DENTAL SURGERY       Procedure: DENTAL SURGERY; DENTAL IMPLANTS   . ENDOSCOPY      Procedure: ENDOSCOPY   . ERCP     . OVARIAN CYST SURGERY      Procedure: OVARIAN CYST SURGERY   . TONSILLECTOMY AND ADENOIDECTOMY      Procedure: TONSILLECTOMY AND ADENOIDECTOMY   . TOTAL HIP ARTHROPLASTY Right 11/01/2020    Procedure: TOTAL HIP REPLACEMENT-ANTERIOR APPROACH [place] (if cancellation in October) or [place];  Surgeon: Alm Lupita [place], MD;  Location: [phone] MAIN OR;  Service: Orthopedics;  Laterality: Right;   [4]  (Not in a hospital admission)  [5]  Allergies  Allergen Reactions   . Mustard Other (See Comments)     Swollen throat    . Venom-Honey Bee Swelling     Swelling of foot    . Oregano Oil      N/v   . Penicillin Rash     Broke out with rash on legs   . Poison Ivy Extract Rash     blister   . Poison Oak Extract Rash     blisters   [6]  Family History  Problem Relation Name Age of Onset   . Cholecystitis Mother Donia Chancy    . Osteoarthritis Mother Donia Chancy    . Arthritis Mother Donia Chancy         Broken bones, hip and knee replacement   . Heart disease Mother Donia Chancy         Stent   . Hip fracture Mother Donia Chancy         Hard fall   . Hypertension Mother Donia Chancy    . Osteoporosis Mother Donia Chancy    . Stroke Mother Donia Chancy    . Osteoarthritis Maternal Grandmother Ronnald    . Diabetes Maternal Grandmother Ronnald         Elderly onset   . Thyroid  disease Daughter  Broderick Berry    . Irritable bowel syndrome Daughter Broderick Berry    . Thyroid  disease Maternal Aunt     . Thyroid  disease Paternal Aunt     . Scoliosis Paternal Aunt     . Arthritis Father Elsie         Spondolosothesis... back accident?   . Gout Father Elsie         Elbow   . Pulmonary disease Father Elsie  Once after accident.   . Scoliosis Maternal Aunt Kellen  daughter    . Thyroid  disease Maternal Aunt Kellen  daughter    . Thyroid  disease Maternal Aunt [place] daughter    . Celiac disease Neg Hx     . Colon cancer Neg Hx     . Colon polyps Neg Hx     . Rheum arthritis Neg Hx     . Lupus Neg Hx     . Anesthesia problems Neg Hx

## 2023-09-12 ENCOUNTER — Encounter (INDEPENDENT_AMBULATORY_CARE_PROVIDER_SITE_OTHER): Payer: Self-pay | Admitting: Internal Medicine

## 2023-09-12 ENCOUNTER — Other Ambulatory Visit: Payer: Self-pay

## 2023-09-12 ENCOUNTER — Ambulatory Visit: Payer: Self-pay | Attending: Internal Medicine | Admitting: Internal Medicine

## 2023-09-12 VITALS — BP 140/90 | HR 102 | Temp 98.3°F | Ht 66.0 in | Wt 135.0 lb

## 2023-09-12 DIAGNOSIS — K638219 Small intestinal bacterial overgrowth, unspecified: Secondary | ICD-10-CM | POA: Insufficient documentation

## 2023-09-12 DIAGNOSIS — R778 Other specified abnormalities of plasma proteins: Secondary | ICD-10-CM | POA: Insufficient documentation

## 2023-09-12 DIAGNOSIS — R768 Other specified abnormal immunological findings in serum: Secondary | ICD-10-CM | POA: Insufficient documentation

## 2023-09-12 DIAGNOSIS — Z23 Encounter for immunization: Secondary | ICD-10-CM | POA: Insufficient documentation

## 2023-09-12 DIAGNOSIS — R252 Cramp and spasm: Secondary | ICD-10-CM | POA: Insufficient documentation

## 2023-09-12 DIAGNOSIS — F909 Attention-deficit hyperactivity disorder, unspecified type: Secondary | ICD-10-CM | POA: Insufficient documentation

## 2023-09-12 DIAGNOSIS — R771 Abnormality of globulin: Secondary | ICD-10-CM | POA: Insufficient documentation

## 2023-09-12 MED ORDER — ROSUVASTATIN 5 MG TABLET
5.0000 mg | ORAL_TABLET | Freq: Every evening | ORAL | 2 refills | Status: DC
  Filled 2023-09-12: qty 30, 30d supply, fill #0
  Filled 2023-10-12: qty 30, 30d supply, fill #1
  Filled 2023-11-18: qty 30, 30d supply, fill #2

## 2023-09-12 MED ORDER — ATOMOXETINE 40 MG CAPSULE
40.0000 mg | ORAL_CAPSULE | Freq: Every day | ORAL | 2 refills | Status: DC
Start: 2023-09-12 — End: 2023-10-11
  Filled 2023-09-12: qty 30, 30d supply, fill #0

## 2023-09-12 NOTE — Nursing Note (Signed)
 Pt wants to discuss the Lipitor and her hip replacement she is going to have also states she is having memory issue andn staying on task and she would like to discuss with you about doing vit d and vit k

## 2023-09-12 NOTE — Progress Notes (Signed)
 INTERNAL MEDICINE, BUILDING A  [address]  [place] [address]  Operated by [facility]      Name: Sonya Parks                       Date of Birth: 28-May-1955   MRN:  [id number]                         Date of visit: 09/12/2023     PCP: Suzen DELENA Asa, PA-C     Subjective  Sonya Parks is a 68 y.o. year old female who presents for Follow Up (Pt wants to discuss the Lipitor and her hip replacement she is going to have also states she is having memory issue andn staying on task and she would like to discuss with you about doing vit d and vit K and her hands hurt and burn)   to clinic.  No specialty comments available.   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    OA (osteoarthritis) 12/05/2022    Fatigue 10/09/2021    Small intestinal bacterial overgrowth (SIBO) 10/09/2021    B12 deficiency 10/09/2021    Mixed hyperlipidemia 09/21/2021    Esophageal reflux 09/21/2021    Vitamin D  deficiency 09/21/2021    Degenerative arthritis 09/21/2021      Current Outpatient Medications   Medication Sig    acetaminophen  (TYLENOL  ARTHRITIS ORAL) Take by mouth    atomoxetine  (STRATTERA ) 40 mg Oral Capsule Take 1 Capsule (40 mg total) by mouth Daily    atorvastatin  (LIPITOR) 10 mg Oral Tablet Take 1 Tablet (10 mg total) by mouth Once a day (Patient not taking: Reported on 09/12/2023)    buPROPion  (WELLBUTRIN  XL) 300 mg extended release 24 hr tablet TAKE 1 TABLET BY MOUTH EVERY MORNING    busPIRone  (BUSPAR ) 15 mg Oral Tablet Take 1 Tablet (15 mg total) by mouth Twice daily    celecoxib  (CELEBREX ) 200 mg Oral Capsule Take 1 Capsule (200 mg total) by mouth Twice daily    cholecalciferol, Vitamin D3, (VITAMIN D -3) 125 mcg  (5,000 unit) Oral Tablet Take 1 Tablet (5,000 Units total) by mouth Daily    estradioL  (ESTRACE ) 0.01 % (0.1 mg/gram) Vaginal Cream Apply a pea-sized amount to urethral opening every night for 1 month.  Then reduce to Monday-Wednesday-Friday dosing as directed    estradioL  (VIVELLE -DOT) 0.0375 mg/24 hr Transdermal Patch Semiweekly APPLY 1 PATCH TOPICALLY TO CLEAN, DRY, AND HAIR-FREE AREA OF THE LOWER STOMACH OR UPPER BUTTOCK AREA TWICE A WEEK (TUESDAY & FRIDAY)    fluticasone propionate (FLONASE) 50 mcg/actuation Nasal Spray, Suspension Administer 1 Spray into each nostril Daily    lifitegrast  (XIIDRA ) 5 % Ophthalmic Dropperette Use 1 drop in both eyes twice daily    lipase -protease -amylase  (CREON ) 24,000 units of lipase  Oral Capsule, Delayed Release(E.C.) Take 1 capsule (24,000 units of lipase  total) by mouth every morning, 1 capsule at noon, and 1 capsule every evening. Take with meals.    LORazepam (ATIVAN) 0.5 mg Oral Tablet 1 Tablet (0.5 mg total)    magnesium hydroxide (MILK OF MAGNESIA ORAL) Take  by mouth    MIEBO, PF, 100 % Ophthalmic Drops     naltrexone  4.5 mg Oral Capsule Take 1 Capsule (4.5 mg total) by mouth Daily    pantoprazole  (PROTONIX ) 40 mg Oral Tablet, Delayed Release (E.C.) Take 1 Tablet (40 mg total) by mouth Daily    progesterone  micronized (PROMETRIUM ) 100 mg Oral Capsule TAKE 1 CAPSULE BY MOUTH ONCE DAILY AT BEDTIME ON DAYS 1-25 OF CALENDAR MONTH    rifAXIMin  (XIFAXAN ) 550 mg Oral Tablet Take 1 Tablet (550 mg total) by mouth Three times a day    rosuvastatin  (CRESTOR ) 5 mg Oral Tablet Take 1 Tablet (5 mg total) by mouth Every evening          FU Visit      Pt here to discuss  chronic and acute problems         Co tinnitus  no better or worse      Fu HLD  pt was on Lipitor  10 mg and ? Cramps  better off  med   will try Crestor   5mg   mwf   2 weeks  than  to daily      FU DJD HIP     Pt is now scheduled  Nov  25 th Dr Winton  [facility]    Discussed IVIG  and will get Dr  Lyman old  records       Co ADHD symptoms   most of her life but worse now     Discussed   focus concentration and not comprehending   as age comes on it has been worse     Pt has trouble with interupting and brain fog    She had blamed  SIBO before but feels she is lacking energy and drive      Fu hypoglobulinemia IgG and IgA        Pt was seen by hematology in  2022 and before hip surgery she had one dose of IV IgG 400 mg / kg    Pt will have labs redrawn and we can consider this as she is having surgery again in Nov    Plan in past was to do  q 28 days      Fu SIBO   no new changes  or problems           REVIEW OF SYSTEMS:   Review of Systems  General: No fever.  No chills.  No weight changes.  HEENT: No vision changes.  Cardiac: No chest pain. No palpitations.  No dizziness.  No light-headedness.  No near syncope.  Resp: No dyspnea at rest, no dyspnea on exertion; no cough or hemoptysis; no orthopnea or PND.  GI: No N/V. No melena.  No bright red blood per rectum.  Ext: No edema.  No claudication.  Neuro: No focal weakness.  No numbness.  All other ROS negative.      Objective: BP (!) 140/90   Pulse (!) 102   Temp 36.8 C (98.3 F)   Ht 1.676 m (5' 6)   Wt 61.2 kg (135 lb)   SpO2 97%   BMI 21.79 kg/m                PHYSICAL EXAM  Physical Exam  Gen: NAD. Alert.   HEENT: PERRL; conjunctivae clear. No JVD or carotid bruit.  Cardiac: RRR with normal S1, S2.   Lungs: Clear to auscultation bilaterally. No rales. No wheezing. No rhonchi.  Abdomen: Soft,  non-tender.non-distended  nl bowel sounds    Extremities: No edema. No cyanosis. No clubbing.  Neurologic:  Grossly intact    Lab Results   Component Value Date    CHOLESTEROL 188 05/16/2023    HDLCHOL 89 05/16/2023    LDLCHOL 83 05/16/2023    TRIG 80 05/16/2023       COMPLETE BLOOD COUNT   Lab Results   Component Value Date    WBC 5.9 09/12/2023    HGB 14.4 (H) 09/12/2023    HCT 40.9 09/12/2023    PLTCNT 245 09/12/2023       DIFFERENTIAL  Lab Results   Component Value  Date    PMNS 72 09/12/2023    LYMPHOCYTES 16 09/12/2023    MONOCYTES 11 09/12/2023    EOSINOPHIL 1 09/12/2023    BASOPHILS 1 09/12/2023    BASOPHILS 0.00 09/12/2023    PMNABS 4.20 09/12/2023    LYMPHSABS 0.90 (L) 09/12/2023    EOSABS 0.10 09/12/2023    MONOSABS 0.60 09/12/2023        COMPREHENSIVE METABOLIC PANEL  Lab Results   Component Value Date    SODIUM 140 09/12/2023    POTASSIUM 4.5 09/12/2023    CHLORIDE 102 09/12/2023    CO2 31 09/12/2023    ANIONGAP 7 09/12/2023    BUN 17 09/12/2023    CREATININE 0.98 09/12/2023    GLUCOSENF 74 09/12/2023    GLUCOSE Negative 12/27/2022    CALCIUM 9.6 09/12/2023    ALB 4.4 09/12/2023    ALBUMIN 4.6 09/12/2023    ALBUMIN 4.4 09/12/2023    TOTALPROTEIN 7.0 09/12/2023    TOTALPROTEIN 6.7 09/12/2023    ALKPHOS 42 09/12/2023    AST 34 09/12/2023    ALT 31 09/12/2023    GFR 64 09/12/2023        THYROID  STIMULATING HORMONE  Lab Results   Component Value Date    TSH 1.117 05/16/2023      Assessment & Plan  Low immunoglobulin level  Fu labs   Cramps, extremity  Mg and K ordered     Stretching discussed and hydration of muscles   Abnormal SPEP  Labs ordered   Small intestinal bacterial overgrowth (SIBO)  Continue  current tx    Hypoglobulinemia  Last infusion  2022  IgG 400mg /kg     Adult ADHD  Trial of straterra  40 mg          Importance of medication adherence discussed.  Advised patient to fill medications regularly.  Discussed statin and its benefits     Need old [facility]  labs and notes    To see Dr Nydia  in the next 2 weeks     Pt on  happy healthy  her    or concentration  and   B12  alpha    Discussed   focus concentration and not comprehending   as age comes on it has been worse     Pt has trouble with interupting and brain fog    She had blamed  SIBO before but feels she is lacking energy and drive    Trial of streterra  Meds reviewed as well as labs.  Chart reviewed and updated.   Continue current treatment.  Keep follow-up appointment.   Vaccine hx reviewed.

## 2023-09-13 ENCOUNTER — Other Ambulatory Visit: Payer: Self-pay

## 2023-09-13 LAB — CBC WITH DIFF
BASOPHIL #: 0 x10ˆ3/uL (ref 0.00–0.10)
BASOPHIL %: 1 % (ref 0–1)
EOSINOPHIL #: 0.1 x10ˆ3/uL (ref 0.00–0.50)
EOSINOPHIL %: 1 % (ref 1–7)
HCT: 40.9 % (ref 31.2–41.9)
HGB: 14.4 g/dL — ABNORMAL HIGH (ref 10.9–14.3)
LYMPHOCYTE #: 0.9 x10ˆ3/uL — ABNORMAL LOW (ref 1.10–3.10)
LYMPHOCYTE %: 16 % (ref 16–46)
MCH: 30.9 pg (ref 24.7–32.8)
MCHC: 35.3 g/dL (ref 32.3–35.6)
MCV: 87.5 fL (ref 75.5–95.3)
MONOCYTE #: 0.6 x10ˆ3/uL (ref 0.20–0.90)
MONOCYTE %: 11 % (ref 4–11)
MPV: 8 fL (ref 7.9–10.8)
NEUTROPHIL #: 4.2 x10ˆ3/uL (ref 1.90–8.20)
NEUTROPHIL %: 72 % (ref 43–77)
PLATELETS: 245 x10ˆ3/uL (ref 140–440)
RBC: 4.67 x10ˆ6/uL (ref 3.63–4.92)
RDW: 13.9 % (ref 12.3–17.7)
WBC: 5.9 x10ˆ3/uL (ref 3.8–11.8)

## 2023-09-13 LAB — COMPREHENSIVE METABOLIC PANEL, NON-FASTING
ALBUMIN/GLOBULIN RATIO: 1.9 — ABNORMAL HIGH (ref 0.8–1.4)
ALBUMIN: 4.6 g/dL (ref 3.5–5.7)
ALKALINE PHOSPHATASE: 42 U/L (ref 34–104)
ALT (SGPT): 31 U/L (ref 7–52)
ANION GAP: 7 mmol/L (ref 4–13)
AST (SGOT): 34 U/L (ref 13–39)
BILIRUBIN TOTAL: 0.8 mg/dL (ref 0.3–1.0)
BUN/CREA RATIO: 17 (ref 6–22)
BUN: 17 mg/dL (ref 7–25)
CALCIUM, CORRECTED: 9.1 mg/dL (ref 8.9–10.8)
CALCIUM: 9.6 mg/dL (ref 8.6–10.3)
CHLORIDE: 102 mmol/L (ref 98–107)
CO2 TOTAL: 31 mmol/L (ref 21–31)
CREATININE: 0.98 mg/dL (ref 0.60–1.30)
ESTIMATED GFR: 64 mL/min/1.73mˆ2 (ref >59)
GLOBULIN: 2.4 (ref 2.0–3.5)
GLUCOSE: 74 mg/dL (ref 74–109)
OSMOLALITY, CALCULATED: 280 mosm/kg (ref 270–290)
POTASSIUM: 4.5 mmol/L (ref 3.5–5.1)
PROTEIN TOTAL: 7 g/dL (ref 6.4–8.9)
SODIUM: 140 mmol/L (ref 136–145)

## 2023-09-13 LAB — MAGNESIUM: MAGNESIUM: 2.6 mg/dL (ref 1.9–2.7)

## 2023-09-14 LAB — MONOCLONAL GAMMOPATHY PROFILE WITH IMMUNOTYPING REFLEX
ALBUMIN: 4.4 g/dL
KAPPA FREE LIGHT CHAINS: 1.53 mg/dL (ref 1.25–3.25)
KAPPA/LAMBDA FLC RATIO: 1.38 (ref 0.80–2.10)
LAMBDA FREE LIGHT CHAINS: 1.11 mg/dL (ref 0.60–2.70)
TOTAL PROTEIN: 6.7 g/dL

## 2023-09-14 LAB — ALBUMIN FOR ELECTROPHORESIS: ALBUMIN: 4.4 g/dL (ref 3.4–4.8)

## 2023-09-14 LAB — KAPPA AND LAMBDA FREE LIGHT CHAINS, SERUM
KAPPA FREE LIGHT CHAINS: 1.53 mg/dL (ref 1.25–3.25)
KAPPA/LAMBDA FLC RATIO: 1.38 (ref 0.80–2.10)
LAMBDA FREE LIGHT CHAINS: 1.11 mg/dL (ref 0.60–2.70)

## 2023-09-14 LAB — IMMUNOGLOBULIN A (IGA), SERUM: IMMUNOGLOBULIN A (IGA): 78 mg/dL — ABNORMAL LOW (ref 85–499)

## 2023-09-14 LAB — PROTEIN FOR ELECTROPHORESIS: PROTEIN TOTAL: 6.7 g/dL (ref 5.6–7.6)

## 2023-09-14 LAB — IMMUNOGLOBULIN G (IGG), SERUM: IMMUNOGLOBULIN G (IGG): 588 mg/dL — ABNORMAL LOW (ref 610–1616)

## 2023-09-16 ENCOUNTER — Ambulatory Visit (INDEPENDENT_AMBULATORY_CARE_PROVIDER_SITE_OTHER): Payer: Self-pay | Admitting: Internal Medicine

## 2023-09-16 DIAGNOSIS — R771 Abnormality of globulin: Secondary | ICD-10-CM

## 2023-09-16 DIAGNOSIS — F909 Attention-deficit hyperactivity disorder, unspecified type: Secondary | ICD-10-CM | POA: Insufficient documentation

## 2023-09-16 NOTE — Assessment & Plan Note (Signed)
 Last infusion  2022  IgG 400mg /kg

## 2023-09-16 NOTE — Assessment & Plan Note (Signed)
Continue current tx

## 2023-09-16 NOTE — Assessment & Plan Note (Signed)
 Trial of straterra  40 mg

## 2023-09-17 ENCOUNTER — Other Ambulatory Visit (INDEPENDENT_AMBULATORY_CARE_PROVIDER_SITE_OTHER): Payer: Self-pay | Admitting: Internal Medicine

## 2023-09-17 ENCOUNTER — Other Ambulatory Visit: Payer: Self-pay

## 2023-09-18 ENCOUNTER — Other Ambulatory Visit: Payer: Self-pay

## 2023-09-18 ENCOUNTER — Encounter (HOSPITAL_COMMUNITY): Payer: Self-pay | Admitting: Internal Medicine

## 2023-09-19 ENCOUNTER — Ambulatory Visit

## 2023-09-19 ENCOUNTER — Other Ambulatory Visit: Payer: Self-pay

## 2023-09-19 MED ORDER — CREON 24,000-76,000-120,000 UNIT CAPSULE,DELAYED RELEASE
DELAYED_RELEASE_CAPSULE | ORAL | 3 refills | Status: AC
  Filled 2023-09-19: qty 100, 33d supply, fill #0
  Filled 2023-11-04: qty 100, 33d supply, fill #1
  Filled 2023-12-10: qty 100, 33d supply, fill #2
  Filled 2024-01-07: qty 100, 33d supply, fill #3

## 2023-09-19 NOTE — Telephone Encounter (Signed)
 Last filled:01/19/2023  Medication/ quantity/refills Creon  q;120 r;3  Last OV:04/03/2023  F/U OV: 11/29/2023

## 2023-09-20 ENCOUNTER — Encounter (HOSPITAL_COMMUNITY): Payer: Self-pay | Admitting: Internal Medicine

## 2023-09-25 ENCOUNTER — Other Ambulatory Visit: Payer: Self-pay

## 2023-09-25 ENCOUNTER — Encounter (HOSPITAL_COMMUNITY): Payer: Self-pay | Admitting: Internal Medicine

## 2023-09-25 DIAGNOSIS — D849 Immunodeficiency, unspecified: Secondary | ICD-10-CM | POA: Insufficient documentation

## 2023-09-26 ENCOUNTER — Other Ambulatory Visit: Payer: Self-pay

## 2023-09-26 ENCOUNTER — Ambulatory Visit
Admission: RE | Admit: 2023-09-26 | Discharge: 2023-09-26 | Disposition: A | Discharge status: Home / Self Care | Source: Ambulatory Visit | Attending: HEMATOLOGY-ONCOLOGY | Admitting: HEMATOLOGY-ONCOLOGY

## 2023-09-26 ENCOUNTER — Encounter (HOSPITAL_COMMUNITY): Payer: Self-pay

## 2023-09-26 VITALS — BP 153/86 | HR 92 | Temp 97.8°F | Resp 18 | Ht 66.0 in | Wt 132.0 lb

## 2023-09-26 DIAGNOSIS — R771 Abnormality of globulin: Secondary | ICD-10-CM | POA: Insufficient documentation

## 2023-09-26 DIAGNOSIS — D849 Immunodeficiency, unspecified: Secondary | ICD-10-CM | POA: Insufficient documentation

## 2023-09-26 MED ORDER — ALBUTEROL SULFATE HFA 90 MCG/ACTUATION AEROSOL INHALER - RN
2.0000 | Freq: Once | RESPIRATORY_TRACT | Status: DC | PRN
Start: 2023-09-26 — End: 2023-09-27

## 2023-09-26 MED ORDER — METHYLPREDNISOLONE SOD SUCC 125 MG SOLUTION FOR INJECTION WRAPPER
60.0000 mg | Freq: Once | INTRAVENOUS | Status: AC
Start: 2023-09-26 — End: 2023-09-26
  Administered 2023-09-26: 60 mg via INTRAVENOUS
  Filled 2023-09-26: qty 2

## 2023-09-26 MED ORDER — HYDROCORTISONE SOD SUCCINATE 100 MG/2 ML VIAL WRAPPER
100.0000 mg | Freq: Once | INTRAMUSCULAR | Status: DC | PRN
Start: 2023-09-26 — End: 2023-09-27

## 2023-09-26 MED ORDER — ALBUTEROL SULFATE 2.5 MG/3 ML (0.083 %) SOLUTION FOR NEBULIZATION
2.5000 mg | INHALATION_SOLUTION | Freq: Once | RESPIRATORY_TRACT | Status: DC | PRN
Start: 2023-09-26 — End: 2023-09-27

## 2023-09-26 MED ORDER — IMMUNE GLOB,GAMM(IGG) 10 %-PRO-IGA 0 TO 50 MCG/ML INTRAVENOUS SOLUTION
400.0000 mg/kg | Freq: Once | INTRAVENOUS | Status: AC
Start: 2023-09-26 — End: 2023-09-26
  Administered 2023-09-26: 0 mg via INTRAVENOUS
  Administered 2023-09-26: 25000 mg via INTRAVENOUS
  Filled 2023-09-26: qty 250

## 2023-09-26 MED ORDER — DIPHENHYDRAMINE 50 MG/ML INJECTION SOLUTION
50.0000 mg | Freq: Once | INTRAMUSCULAR | Status: DC | PRN
Start: 2023-09-26 — End: 2023-09-27

## 2023-09-26 MED ORDER — DIPHENHYDRAMINE 50 MG/ML INJECTION SOLUTION
25.0000 mg | Freq: Once | INTRAMUSCULAR | Status: DC | PRN
Start: 2023-09-26 — End: 2023-09-27

## 2023-09-26 MED ORDER — EPINEPHRINE 1 MG/ML (1 ML) INJECTION SOLUTION
0.3000 mg | Freq: Once | INTRAMUSCULAR | Status: DC | PRN
Start: 2023-09-26 — End: 2023-09-27

## 2023-09-26 MED ORDER — FAMOTIDINE (PF) 20 MG/2 ML INTRAVENOUS SOLUTION
20.0000 mg | Freq: Once | INTRAVENOUS | Status: DC | PRN
Start: 2023-09-26 — End: 2023-09-27

## 2023-09-26 MED ORDER — DIPHENHYDRAMINE 50 MG CAPSULE
50.0000 mg | ORAL_CAPSULE | Freq: Once | ORAL | Status: DC
Start: 2023-09-26 — End: 2023-09-27
  Administered 2023-09-26: 0 mg via ORAL

## 2023-09-26 MED ORDER — ACETAMINOPHEN 325 MG TABLET
650.0000 mg | ORAL_TABLET | Freq: Once | ORAL | Status: DC
Start: 2023-09-26 — End: 2023-09-27
  Administered 2023-09-26: 0 mg via ORAL

## 2023-09-26 MED ORDER — MEPERIDINE (PF) 25 MG/ML INJECTION SOLUTION
12.5000 mg | Freq: Once | INTRAMUSCULAR | Status: DC | PRN
Start: 2023-09-26 — End: 2023-09-27

## 2023-09-26 MED ORDER — SODIUM CHLORIDE 0.9 % IV BOLUS
500.0000 mL | INJECTION | Freq: Once | Status: DC | PRN
Start: 2023-09-26 — End: 2023-09-27

## 2023-09-26 NOTE — Nurses Notes (Signed)
 1157 Pt ambulatory to OP Onc unit for tx. VS obtained. Pt states she has had immunoglobulin tx before with no adverse reaction. IV started R lat FA. Excellent blood return noted. Pt states she had labs drawn 09/12/23. Verbal education given on premeds/Privigen . Questions answered. Understanding verbalized. Rexene Lipps, RN  [phone] Pt took own home Benadryl  elixer 25 mg po and Tylenol  650 mg po. Solu-Medrol  60 mg IVP given. Rexene Lipps, RN  [phone] Privigen  10% 25 G IV infusion started. To be titrated per order. Call bell in reach. Rexene Lipps, RN  1320 No adverse reaction noted.Privigen  rate increased to 60 ml/hr. Rexene Lipps, RN  1350 Privigen  rate increased to 90 ml/hr. Pt up to BR without incident. Rexene Lipps, RN  1420 Privigen  rate increased to 120 ml/hr. Rexene Lipps, RN  [phone] Privigen  rate increased to 150 ml/hr. Rexene Lipps, RN  1520 Privigen  rate increased to 180 ml/hr. Pt up to BR. Rexene Lipps, RN  1540 Privigen  infusion completed. Line flushed with NS. VS obtained. Pt tolerated tx without difficulty. Mykell Genao, RN  1555 IV d/c'ed. Cath intact. Site clear. Drsg with Coban wrap applied. Pt to BR. Rexene Lipps, RN  [phone] Pt left OP Onc unit ambulatory. Pt declined wheelchair. No adverse reaction noted. Rexene Lipps, RN

## 2023-09-27 ENCOUNTER — Telehealth (INDEPENDENT_AMBULATORY_CARE_PROVIDER_SITE_OTHER): Payer: Self-pay | Admitting: Internal Medicine

## 2023-10-02 ENCOUNTER — Other Ambulatory Visit: Payer: Self-pay

## 2023-10-03 ENCOUNTER — Other Ambulatory Visit: Payer: Self-pay

## 2023-10-04 ENCOUNTER — Other Ambulatory Visit: Payer: Self-pay

## 2023-10-11 ENCOUNTER — Telehealth (INDEPENDENT_AMBULATORY_CARE_PROVIDER_SITE_OTHER): Payer: Self-pay | Admitting: Internal Medicine

## 2023-10-11 ENCOUNTER — Other Ambulatory Visit: Payer: Self-pay

## 2023-10-11 MED ORDER — ATOMOXETINE 60 MG CAPSULE
60.0000 mg | ORAL_CAPSULE | Freq: Every day | ORAL | 1 refills | Status: AC
  Filled 2023-10-11: qty 90, 90d supply, fill #0
  Filled 2024-01-07: qty 90, 90d supply, fill #1

## 2023-10-12 ENCOUNTER — Other Ambulatory Visit: Payer: Self-pay

## 2023-10-12 ENCOUNTER — Other Ambulatory Visit (INDEPENDENT_AMBULATORY_CARE_PROVIDER_SITE_OTHER): Payer: Self-pay | Admitting: Internal Medicine

## 2023-10-12 ENCOUNTER — Other Ambulatory Visit (INDEPENDENT_AMBULATORY_CARE_PROVIDER_SITE_OTHER): Payer: Self-pay | Admitting: Physician Assistant

## 2023-10-12 MED ORDER — NALTREXONE 4.5 MG CAPSULE
1.0000 | ORAL_CAPSULE | Freq: Every day | ORAL | 1 refills | Status: DC
Start: 2023-10-12 — End: 2023-10-16
  Filled 2023-10-12 – 2023-10-16 (×2): qty 90, 90d supply, fill #0

## 2023-10-15 ENCOUNTER — Other Ambulatory Visit: Payer: Self-pay

## 2023-10-16 ENCOUNTER — Other Ambulatory Visit: Payer: Self-pay

## 2023-10-17 ENCOUNTER — Other Ambulatory Visit: Payer: Self-pay

## 2023-10-18 ENCOUNTER — Other Ambulatory Visit: Payer: Self-pay

## 2023-10-24 ENCOUNTER — Ambulatory Visit
Admission: RE | Admit: 2023-10-24 | Discharge: 2023-10-24 | Disposition: A | Discharge status: Home / Self Care | Source: Ambulatory Visit | Attending: Internal Medicine | Admitting: Internal Medicine

## 2023-10-24 ENCOUNTER — Other Ambulatory Visit: Payer: Self-pay

## 2023-10-24 ENCOUNTER — Other Ambulatory Visit (INDEPENDENT_AMBULATORY_CARE_PROVIDER_SITE_OTHER): Payer: Self-pay | Admitting: Internal Medicine

## 2023-10-24 ENCOUNTER — Encounter (HOSPITAL_COMMUNITY): Payer: Self-pay

## 2023-10-24 VITALS — BP 153/86 | HR 86 | Temp 97.6°F | Resp 18 | Ht 66.0 in | Wt 130.4 lb

## 2023-10-24 DIAGNOSIS — R771 Abnormality of globulin: Secondary | ICD-10-CM | POA: Insufficient documentation

## 2023-10-24 DIAGNOSIS — D849 Immunodeficiency, unspecified: Secondary | ICD-10-CM | POA: Insufficient documentation

## 2023-10-24 MED ORDER — DIPHENHYDRAMINE 50 MG/ML INJECTION SOLUTION
50.0000 mg | Freq: Once | INTRAMUSCULAR | Status: DC | PRN
Start: 2023-10-24 — End: 2023-10-25

## 2023-10-24 MED ORDER — MEPERIDINE (PF) 25 MG/ML INJECTION SOLUTION
12.5000 mg | Freq: Once | INTRAMUSCULAR | Status: DC | PRN
Start: 2023-10-24 — End: 2023-10-25

## 2023-10-24 MED ORDER — ACETAMINOPHEN 325 MG TABLET
650.0000 mg | ORAL_TABLET | Freq: Once | ORAL | Status: DC
Start: 2023-10-24 — End: 2023-10-25
  Administered 2023-10-24: 0 mg via ORAL

## 2023-10-24 MED ORDER — METHYLPREDNISOLONE SOD SUCC 125 MG SOLUTION FOR INJECTION WRAPPER
60.0000 mg | Freq: Once | INTRAVENOUS | Status: AC
Start: 2023-10-24 — End: 2023-10-24
  Administered 2023-10-24: 60 mg via INTRAVENOUS
  Filled 2023-10-24: qty 2

## 2023-10-24 MED ORDER — ALBUTEROL SULFATE HFA 90 MCG/ACTUATION AEROSOL INHALER - RN
2.0000 | Freq: Once | RESPIRATORY_TRACT | Status: DC | PRN
Start: 2023-10-24 — End: 2023-10-25

## 2023-10-24 MED ORDER — EPINEPHRINE 1 MG/ML (1 ML) INJECTION SOLUTION
0.3000 mg | Freq: Once | INTRAMUSCULAR | Status: DC | PRN
Start: 2023-10-24 — End: 2023-10-25

## 2023-10-24 MED ORDER — HYDROCORTISONE SOD SUCCINATE 100 MG/2 ML VIAL WRAPPER
100.0000 mg | Freq: Once | INTRAMUSCULAR | Status: DC | PRN
Start: 2023-10-24 — End: 2023-10-25

## 2023-10-24 MED ORDER — DIPHENHYDRAMINE 50 MG CAPSULE
50.0000 mg | ORAL_CAPSULE | Freq: Once | ORAL | Status: DC
Start: 2023-10-24 — End: 2023-10-25
  Administered 2023-10-24: 0 mg via ORAL

## 2023-10-24 MED ORDER — DIPHENHYDRAMINE 50 MG/ML INJECTION SOLUTION
25.0000 mg | Freq: Once | INTRAMUSCULAR | Status: DC | PRN
Start: 2023-10-24 — End: 2023-10-25

## 2023-10-24 MED ORDER — FAMOTIDINE (PF) 20 MG/2 ML INTRAVENOUS SOLUTION
20.0000 mg | Freq: Once | INTRAVENOUS | Status: DC | PRN
Start: 2023-10-24 — End: 2023-10-25

## 2023-10-24 MED ORDER — SODIUM CHLORIDE 0.9 % IV BOLUS
500.0000 mL | INJECTION | Freq: Once | Status: DC | PRN
Start: 2023-10-24 — End: 2023-10-25

## 2023-10-24 MED ORDER — ALBUTEROL SULFATE 2.5 MG/3 ML (0.083 %) SOLUTION FOR NEBULIZATION
2.5000 mg | INHALATION_SOLUTION | Freq: Once | RESPIRATORY_TRACT | Status: DC | PRN
Start: 2023-10-24 — End: 2023-10-25

## 2023-10-24 MED ORDER — IMMUNE GLOB,GAMM(IGG) 10 %-PRO-IGA 0 TO 50 MCG/ML INTRAVENOUS SOLUTION
400.0000 mg/kg | Freq: Once | INTRAVENOUS | Status: AC
Start: 2023-10-24 — End: 2023-10-24
  Administered 2023-10-24: 25000 mg via INTRAVENOUS
  Administered 2023-10-24: 0 mg via INTRAVENOUS
  Filled 2023-10-24: qty 250

## 2023-10-24 NOTE — Nurses Notes (Signed)
 1208 Pt ambulatory to OP Onc unit for tx. VS obtained. Pt states she has had immunoglobulin tx before. Last tx pt states she had a mild headache x 2 days and was tired. IV started R lat FA. Excellent blood return noted. Rexene Lipps, RN  Patient Assessment/Symptom Management Patient Has No MD Appointment Today   Key: (+) Symptom present           (-)  Symptom not present If Symptom is Positive(+) a Nursing Note is required   Edema -   Uncontrolled Nausea -   Vomiting -   Inability to eat/drink -   Mouth Sores -   Diarrhea -   Constipation (? Last BM) -   Fatigue that interferes with ADL's -   Numbness/Tingling -change -   Other -   Fever/Signs & Symptoms of infection -   Nurse Initials TL RN   1240 Pt took own home Benadryl  elixer 25 mg po and Tylenol  650 mg po. Solu-Medrol  60 mg IVP given. Rexene Lipps, RN  [phone] Privigen  10% 25 G IV infusion started. To be titrated per order. Call bell in reach. Rexene Lipps, RN  [phone] No adverse reaction noted.Privigen  rate increased to 59 ml/hr. Rexene Lipps, RN  [phone] Privigen  rate increased to 88.5 ml/hr.  Rexene Lipps, RN  [phone] Privigen  rate increased to 118 ml/hr. Rexene Lipps, RN  [phone] Privigen  rate increased to 147.5 ml/hr. BP increasing. Will not titrate further. Pt denies c/o. Rexene Lipps, RN  [phone] Privigen  infusion completed. Line flushed with NS. VS obtained. Pt tolerated tx without difficulty. Vineeth Fell, RN  1600 IV d/c'ed. Cath intact. Site clear. Drsg with Coban wrap applied. Pt to BR. Rexene Lipps, RN  [phone] Pt left OP Onc unit ambulatory. No adverse reaction noted. Rexene Lipps, RN

## 2023-11-03 ENCOUNTER — Other Ambulatory Visit (INDEPENDENT_AMBULATORY_CARE_PROVIDER_SITE_OTHER): Payer: Self-pay | Admitting: Internal Medicine

## 2023-11-05 ENCOUNTER — Other Ambulatory Visit: Payer: Self-pay

## 2023-11-05 MED ORDER — BUSPIRONE 15 MG TABLET
15.0000 mg | ORAL_TABLET | Freq: Two times a day (BID) | ORAL | 3 refills | Status: AC
Start: 2023-11-05 — End: ?
  Filled 2023-11-05: qty 180, 90d supply, fill #0

## 2023-11-06 ENCOUNTER — Other Ambulatory Visit: Payer: Self-pay

## 2023-11-07 ENCOUNTER — Other Ambulatory Visit: Payer: Self-pay

## 2023-11-09 ENCOUNTER — Other Ambulatory Visit (INDEPENDENT_AMBULATORY_CARE_PROVIDER_SITE_OTHER): Payer: Self-pay | Admitting: Internal Medicine

## 2023-11-09 ENCOUNTER — Encounter (INDEPENDENT_AMBULATORY_CARE_PROVIDER_SITE_OTHER): Payer: Self-pay | Admitting: Internal Medicine

## 2023-11-09 DIAGNOSIS — R778 Other specified abnormalities of plasma proteins: Secondary | ICD-10-CM

## 2023-11-09 DIAGNOSIS — R7689 Other specified abnormal immunological findings in serum: Secondary | ICD-10-CM

## 2023-11-09 DIAGNOSIS — R771 Abnormality of globulin: Secondary | ICD-10-CM

## 2023-11-12 ENCOUNTER — Ambulatory Visit: Attending: Internal Medicine

## 2023-11-12 ENCOUNTER — Other Ambulatory Visit: Payer: Self-pay

## 2023-11-12 DIAGNOSIS — R7689 Other specified abnormal immunological findings in serum: Secondary | ICD-10-CM | POA: Insufficient documentation

## 2023-11-12 DIAGNOSIS — R778 Other specified abnormalities of plasma proteins: Secondary | ICD-10-CM | POA: Insufficient documentation

## 2023-11-12 DIAGNOSIS — R771 Abnormality of globulin: Secondary | ICD-10-CM | POA: Insufficient documentation

## 2023-11-12 LAB — CBC WITH DIFF
BASOPHIL #: 0.1 x10ˆ3/uL (ref 0.00–0.10)
BASOPHIL %: 1 % (ref 0–1)
EOSINOPHIL #: 0.1 x10ˆ3/uL (ref 0.00–0.50)
EOSINOPHIL %: 1 % (ref 1–7)
HCT: 43.7 % — ABNORMAL HIGH (ref 31.2–41.9)
HGB: 15.3 g/dL — ABNORMAL HIGH (ref 10.9–14.3)
LYMPHOCYTE #: 1 x10ˆ3/uL — ABNORMAL LOW (ref 1.10–3.10)
LYMPHOCYTE %: 19 % (ref 16–46)
MCH: 30.8 pg (ref 24.7–32.8)
MCHC: 35.1 g/dL (ref 32.3–35.6)
MCV: 87.5 fL (ref 75.5–95.3)
MONOCYTE #: 0.5 x10ˆ3/uL (ref 0.20–0.90)
MONOCYTE %: 10 % (ref 4–11)
MPV: 8.7 fL (ref 7.9–10.8)
NEUTROPHIL #: 3.6 x10ˆ3/uL (ref 1.90–8.20)
NEUTROPHIL %: 68 % (ref 43–77)
PLATELETS: 277 x10ˆ3/uL (ref 140–440)
RBC: 4.99 x10ˆ6/uL — ABNORMAL HIGH (ref 3.63–4.92)
RDW: 14.7 % (ref 12.3–17.7)
WBC: 5.3 x10ˆ3/uL (ref 3.8–11.8)

## 2023-11-12 LAB — COMPREHENSIVE METABOLIC PANEL, NON-FASTING
ALBUMIN/GLOBULIN RATIO: 1.5 — ABNORMAL HIGH (ref 0.8–1.4)
ALBUMIN: 4.8 g/dL (ref 3.5–5.7)
ALKALINE PHOSPHATASE: 42 U/L (ref 34–104)
ALT (SGPT): 37 U/L (ref 7–52)
ANION GAP: 7 mmol/L (ref 4–13)
AST (SGOT): 36 U/L (ref 13–39)
BILIRUBIN TOTAL: 0.7 mg/dL (ref 0.3–1.0)
BUN/CREA RATIO: 25 — ABNORMAL HIGH (ref 6–22)
BUN: 25 mg/dL (ref 7–25)
CALCIUM, CORRECTED: 9.3 mg/dL (ref 8.9–10.8)
CALCIUM: 9.9 mg/dL (ref 8.6–10.3)
CHLORIDE: 101 mmol/L (ref 98–107)
CO2 TOTAL: 27 mmol/L (ref 21–31)
CREATININE: 0.99 mg/dL (ref 0.60–1.30)
ESTIMATED GFR: 62 mL/min/1.73mˆ2 (ref >59)
GLOBULIN: 3.3 (ref 2.0–3.5)
GLUCOSE: 88 mg/dL (ref 74–109)
OSMOLALITY, CALCULATED: 274 mosm/kg (ref 270–290)
POTASSIUM: 4.4 mmol/L (ref 3.5–5.1)
PROTEIN TOTAL: 8.1 g/dL (ref 6.4–8.9)
SODIUM: 135 mmol/L — ABNORMAL LOW (ref 136–145)

## 2023-11-13 LAB — IMMUNOGLOBULIN A (IGA), SERUM: IMMUNOGLOBULIN A (IGA): 88 mg/dL (ref 85–499)

## 2023-11-13 LAB — IMMUNOGLOBULIN PROFILE (IGA, IGG, AND IGM), SERUM
IMMUNOGLOBULIN A (IGA): 88 mg/dL (ref 85–499)
IMMUNOGLOBULIN G (IGG): 1008 mg/dL (ref 610–1616)
IMMUNOGLOBULIN M (IGM): 228 mg/dL (ref 35–242)

## 2023-11-18 ENCOUNTER — Other Ambulatory Visit: Payer: Self-pay

## 2023-11-19 ENCOUNTER — Other Ambulatory Visit: Payer: Self-pay

## 2023-11-19 MED ORDER — MIEBO (PF) 100 % EYE DROPS
OPHTHALMIC | 3 refills | Status: AC
  Filled 2023-11-19 – 2023-12-10 (×2): qty 3, 5d supply, fill #0
  Filled 2023-12-16: qty 3, 8d supply, fill #0
  Filled 2023-12-31 – 2024-01-07 (×2): qty 3, 8d supply, fill #1

## 2023-11-19 MED ORDER — XIIDRA 5 % EYE DROPS IN A DROPPERETTE
1.0000 [drp] | Freq: Two times a day (BID) | OPHTHALMIC | 1 refills | Status: AC
Start: 2023-11-19 — End: ?
  Filled 2023-11-19: qty 180, 90d supply, fill #0

## 2023-11-20 ENCOUNTER — Ambulatory Visit: Attending: Internal Medicine | Admitting: Internal Medicine

## 2023-11-20 ENCOUNTER — Other Ambulatory Visit: Payer: Self-pay

## 2023-11-20 ENCOUNTER — Ambulatory Visit (INDEPENDENT_AMBULATORY_CARE_PROVIDER_SITE_OTHER): Payer: Self-pay | Admitting: Internal Medicine

## 2023-11-20 ENCOUNTER — Telehealth (INDEPENDENT_AMBULATORY_CARE_PROVIDER_SITE_OTHER): Payer: Self-pay | Admitting: Internal Medicine

## 2023-11-20 ENCOUNTER — Encounter (INDEPENDENT_AMBULATORY_CARE_PROVIDER_SITE_OTHER): Payer: Self-pay | Admitting: Internal Medicine

## 2023-11-20 VITALS — BP 122/70

## 2023-11-20 DIAGNOSIS — D802 Selective deficiency of immunoglobulin A [IgA]: Secondary | ICD-10-CM

## 2023-11-20 DIAGNOSIS — R7689 Other specified abnormal immunological findings in serum: Secondary | ICD-10-CM

## 2023-11-20 DIAGNOSIS — D803 Selective deficiency of immunoglobulin G [IgG] subclasses: Secondary | ICD-10-CM

## 2023-11-20 DIAGNOSIS — M25552 Pain in left hip: Secondary | ICD-10-CM | POA: Insufficient documentation

## 2023-11-20 NOTE — Telephone Encounter (Signed)
 INTERNAL MEDICINE, [facility] A  [address]  [place] [address]  Operated by [facility]  Telephone Visit    Name:  Sonya Parks MRN: [id number]   Date:  11/20/2023 DOB: 16-Mar-1955 (68 y.o.)          The patient/family initiated a request for telephone service.  Verbal consent for this service was obtained from the patient/family.  Reason for audio only:Patient preference    Last office visit in this department: 09/12/2023      Reason for call:Lab Results       HPI  This 68 yo is for fu labs   Pt has had  low  IgA  and IgG  and now has had infusions and she is feeling  better since tx.  Pt says skin is clear for the first time in a long time.  Pt says overall she feels better  Gut issues some better as well   Fu labs called to pt today  and  we discussed options going forward  pt will have  infusion in am and than already on for Dec but I will look at those and see if there is a change  next time post surgery if  stable will hold of on continuing and monitor labs     Pt is in agreement with plan       See IGG and IGA labs     Allergies[1]     acetaminophen  (TYLENOL  ARTHRITIS ORAL), Take by mouth  atomoxetine  (STRATTERA ) 60 mg Oral Capsule, Take 1 Capsule (60 mg total) by mouth Daily  atorvastatin  (LIPITOR) 10 mg Oral Tablet, Take 1 Tablet (10 mg total) by mouth Once a day (Patient not taking: Reported on 09/12/2023)  buPROPion  (WELLBUTRIN  XL) 300 mg extended release 24 hr tablet, TAKE 1 TABLET BY MOUTH EVERY MORNING  busPIRone  (BUSPAR ) 15 mg Oral Tablet, Take 1 Tablet (15 mg total) by mouth Twice daily  celecoxib  (CELEBREX ) 200 mg Oral Capsule, Take 1 Capsule (200 mg total) by mouth Twice daily  cholecalciferol, Vitamin D3, (VITAMIN D -3) 125 mcg (5,000 unit) Oral Tablet, Take 1 Tablet (5,000 Units total) by mouth Daily  estradioL  (ESTRACE ) 0.01 % (0.1 mg/gram) Vaginal Cream, Apply a pea-sized amount to urethral opening every night for 1 month.  Then reduce to Monday-Wednesday-Friday dosing as  directed  estradioL  (VIVELLE -DOT) 0.0375 mg/24 hr Transdermal Patch Semiweekly, APPLY 1 PATCH TOPICALLY TO CLEAN, DRY, AND HAIR-FREE AREA OF THE LOWER STOMACH OR UPPER BUTTOCK AREA TWICE A WEEK (TUESDAY & FRIDAY)  fluticasone propionate (FLONASE) 50 mcg/actuation Nasal Spray, Suspension, Administer 1 Spray into each nostril Daily  lifitegrast  (XIIDRA ) 5 % Ophthalmic Dropperette, Use 1 drop in both eyes twice daily  lifitegrast  (XIIDRA ) 5 % Ophthalmic Dropperette, Instill 1 drop into both eyes twice a day  lipase -protease -amylase , pork, (CREON ) 24,000 units of lipase  Oral Capsule, Delayed Release(E.C.), Take 1 capsule (24,000 units of lipase  total) by mouth every morning, 1 capsule at noon, and 1 capsule every evening. Take with meals.  LORazepam (ATIVAN) 0.5 mg Oral Tablet, 1 Tablet (0.5 mg total)  magnesium hydroxide (MILK OF MAGNESIA ORAL), Take 30 mL by mouth Every night  MIEBO, PF, 100 % Ophthalmic Drops,   pantoprazole  (PROTONIX ) 40 mg Oral Tablet, Delayed Release (E.C.), Take 1 Tablet (40 mg total) by mouth Daily  perfluorohexyloctane, PF, (MIEBO, PF,) 100 % Ophthalmic Drops, Instill 1 drop into both eyes four times a day  progesterone  micronized (PROMETRIUM ) 100 mg Oral Capsule, TAKE 1 CAPSULE  BY MOUTH ONCE DAILY AT BEDTIME ON DAYS 1-25 OF CALENDAR MONTH  rifAXIMin  (XIFAXAN ) 550 mg Oral Tablet, Take 1 Tablet (550 mg total) by mouth Three times a day (Patient taking differently: Take 1 Tablet (550 mg total) by mouth Three times a day Taking bid)  rosuvastatin  (CRESTOR ) 5 mg Oral Tablet, Take 1 Tablet (5 mg total) by mouth Every evening    No facility-administered medications prior to visit.       ROS     OBJECTIVE:  There were no vitals filed for this visit.   Exam  Const  General: cooperative, no acute distress and alert  Resp  Effort & Inspection: able to speak in complete sentences  Psych  Mental Status: mental status grossly normal  Mood: congruent mood  Affect: normal affect  Attitude:  cooperative  Insight: insight good  Judgment: judgment good      ICD-10-CM    1. Left hip pain  M25.552       2. 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 Call or return to clinic prn if these symptoms worsen or fail to improve as anticipated.  Meds reviewed as well as labs.  Chart reviewed and updated.   Surgery in  2 weeks  hip replacement Nov  25th    H2 for PT     Will do this last treatment and monitor  for changes  Skin is clear  she has been feeling better   ie abdomen     LOS Determination: Medical Decision Making- Direct audio communication with patient was 12 minutes    Sonya DELENA Asa, PA-C        [1]   Allergies  Allergen Reactions    Bee Venom Protein (Honey Bee) Swelling     hives    Metoprolol  Other Adverse Reaction (Add comment)     Severe fatigue    Penicillins Rash    Berberine      N/v    Garlic     Oregano Oil      N/v     Poison Ivy Extract Hives/ Urticaria

## 2023-11-21 ENCOUNTER — Other Ambulatory Visit: Payer: Self-pay

## 2023-11-21 ENCOUNTER — Ambulatory Visit
Admission: RE | Admit: 2023-11-21 | Discharge: 2023-11-21 | Disposition: A | Discharge status: Home / Self Care | Source: Ambulatory Visit | Attending: Internal Medicine | Admitting: Internal Medicine

## 2023-11-21 ENCOUNTER — Encounter (HOSPITAL_COMMUNITY): Payer: Self-pay

## 2023-11-21 ENCOUNTER — Encounter (INDEPENDENT_AMBULATORY_CARE_PROVIDER_SITE_OTHER): Payer: Self-pay | Admitting: Internal Medicine

## 2023-11-21 VITALS — BP 144/98 | HR 89 | Temp 96.9°F | Resp 18 | Wt 130.0 lb

## 2023-11-21 DIAGNOSIS — R771 Abnormality of globulin: Secondary | ICD-10-CM | POA: Insufficient documentation

## 2023-11-21 DIAGNOSIS — D849 Immunodeficiency, unspecified: Secondary | ICD-10-CM | POA: Insufficient documentation

## 2023-11-21 MED ORDER — METHYLPREDNISOLONE SOD SUCC 125 MG SOLUTION FOR INJECTION WRAPPER
60.0000 mg | Freq: Once | INTRAVENOUS | Status: AC
Start: 2023-11-21 — End: 2023-11-21
  Administered 2023-11-21: 60 mg via INTRAVENOUS
  Filled 2023-11-21: qty 2

## 2023-11-21 MED ORDER — ACETAMINOPHEN 325 MG TABLET
650.0000 mg | ORAL_TABLET | Freq: Once | ORAL | Status: DC
Start: 2023-11-21 — End: 2023-11-22
  Administered 2023-11-21: 0 mg via ORAL

## 2023-11-21 MED ORDER — FAMOTIDINE (PF) 20 MG/2 ML INTRAVENOUS SOLUTION
20.0000 mg | Freq: Once | INTRAVENOUS | Status: DC | PRN
Start: 2023-11-21 — End: 2023-11-22

## 2023-11-21 MED ORDER — SODIUM CHLORIDE 0.9 % IV BOLUS
500.0000 mL | INJECTION | Freq: Once | Status: DC | PRN
Start: 2023-11-21 — End: 2023-11-22

## 2023-11-21 MED ORDER — DIPHENHYDRAMINE 50 MG CAPSULE
50.0000 mg | ORAL_CAPSULE | Freq: Once | ORAL | Status: DC
Start: 2023-11-21 — End: 2023-11-22
  Administered 2023-11-21: 0 mg via ORAL

## 2023-11-21 MED ORDER — DIPHENHYDRAMINE 50 MG/ML INJECTION SOLUTION
25.0000 mg | Freq: Once | INTRAMUSCULAR | Status: DC | PRN
Start: 2023-11-21 — End: 2023-11-22

## 2023-11-21 MED ORDER — ALBUTEROL SULFATE HFA 90 MCG/ACTUATION AEROSOL INHALER - RN
2.0000 | Freq: Once | RESPIRATORY_TRACT | Status: DC | PRN
Start: 2023-11-21 — End: 2023-11-22

## 2023-11-21 MED ORDER — EPINEPHRINE 1 MG/ML (1 ML) INJECTION SOLUTION
0.3000 mg | Freq: Once | INTRAMUSCULAR | Status: DC | PRN
Start: 2023-11-21 — End: 2023-11-22

## 2023-11-21 MED ORDER — HYDROCORTISONE SOD SUCCINATE 100 MG/2 ML VIAL WRAPPER
100.0000 mg | Freq: Once | INTRAMUSCULAR | Status: DC | PRN
Start: 2023-11-21 — End: 2023-11-22

## 2023-11-21 MED ORDER — DIPHENHYDRAMINE 50 MG/ML INJECTION SOLUTION
50.0000 mg | Freq: Once | INTRAMUSCULAR | Status: DC | PRN
Start: 2023-11-21 — End: 2023-11-22

## 2023-11-21 MED ORDER — IMMUNE GLOB,GAMM(IGG) 10 %-PRO-IGA 0 TO 50 MCG/ML INTRAVENOUS SOLUTION
400.0000 mg/kg | Freq: Once | INTRAVENOUS | Status: AC
Start: 2023-11-21 — End: 2023-11-21
  Administered 2023-11-21: 25000 mg via INTRAVENOUS
  Administered 2023-11-21: 0 mg via INTRAVENOUS
  Filled 2023-11-21: qty 250

## 2023-11-21 MED ORDER — ALBUTEROL SULFATE 2.5 MG/3 ML (0.083 %) SOLUTION FOR NEBULIZATION
2.5000 mg | INHALATION_SOLUTION | Freq: Once | RESPIRATORY_TRACT | Status: DC | PRN
Start: 2023-11-21 — End: 2023-11-22

## 2023-11-21 MED ORDER — MEPERIDINE (PF) 25 MG/ML INJECTION SOLUTION
12.5000 mg | Freq: Once | INTRAMUSCULAR | Status: DC | PRN
Start: 2023-11-21 — End: 2023-11-22

## 2023-11-21 NOTE — Nurses Notes (Signed)
 [phone]- Arrived to unit ambulatory for IVIG treatment. All assessments complete. BP elevated. Reports she had increased her salt intake due to low sodium level on last week's lab work. Lungs clear. No edema noted. Richerd Oak, RN   [phone]- Peripheral IV started to right wrist with second attempt, tolerated well. Richerd Oak, RN   [phone]- PO Tylenol  and Benadryl  taken from home supply. IV Solumedrol given.   1340- Privigen  25 g infusion started at 29.5mL/hr with D5W running concurrently. BP elevated 153/96 before starting. Richerd Oak, RN   1410- BP 171/96, rate kept at 29.9mL/hr. Richerd Oak, RN   1440- BP 161/87, rate increased to 38mL/hr. Richerd Oak, RN   1510- BP improving 145/78, rate increased to 88.60mL/hr. Richerd Oak, RN   1540- BP remains stable, increased to 140mL/hr. Richerd Oak, RN   1620- BP remains stable, increased to 130mL/hr. Richerd Oak, RN   [phone]- Privigen  complete, line flushing. Tolerated well. VSS. Richerd Oak, RN   [phone]- Peripheral IV removed, pressure dressing applied. No complaints voiced. Denies headache at this time. Left unit ambulatory. Richerd Oak, RN

## 2023-11-21 NOTE — Progress Notes (Signed)
 INTERNAL MEDICINE, [facility]  [address]  [place] [address]  Operated by [facility]  Telephone Visit     Name:  Sonya Parks MRN: [id number]   Date:  11/20/2023 DOB: 10-Nov-1955 (68 y.o.)           The patient/family initiated a request for telephone service.  Verbal consent for this service was obtained from the patient/family.  Reason for audio only:Patient preference     Last office visit in this department: 09/12/2023      Reason for call:Lab Results        HPI  This 68 yo is for fu labs   Pt has had  low  IgA  and IgG  and now has had infusions and she is feeling  better since tx.  Pt says skin is clear for the first time in a long time.  Pt says overall she feels better  Gut issues some better as well   Fu labs called to pt today  and  we discussed options going forward  pt will have  infusion in am and than already on for Dec but I will look at those and see if there is a change  next time post surgery if  stable will hold of on continuing and monitor labs     Pt is in agreement with plan        See IGG and IGA labs      [Allergies]     [Allergies]        Allergen Reactions    Bee Venom Protein (Honey Bee) Swelling       hives    Metoprolol  Other Adverse Reaction (Add comment)       Severe fatigue    Penicillins Rash    Berberine         N/v    Garlic      Oregano Oil         N/v     Poison Ivy Extract Hives/ Urticaria        Medications Previous to this Encounter   acetaminophen  (TYLENOL  ARTHRITIS ORAL), Take by mouth  atomoxetine  (STRATTERA ) 60 mg Oral Capsule, Take 1 Capsule (60 mg total) by mouth Daily  atorvastatin  (LIPITOR) 10 mg Oral Tablet, Take 1 Tablet (10 mg total) by mouth Once a day (Patient not taking: Reported on 09/12/2023)  buPROPion  (WELLBUTRIN  XL) 300 mg extended release 24 hr tablet, TAKE 1 TABLET BY MOUTH EVERY MORNING  busPIRone  (BUSPAR ) 15 mg Oral Tablet, Take 1 Tablet (15 mg total) by mouth Twice daily  celecoxib  (CELEBREX ) 200 mg Oral Capsule, Take 1  Capsule (200 mg total) by mouth Twice daily  cholecalciferol, Vitamin D3, (VITAMIN D -3) 125 mcg (5,000 unit) Oral Tablet, Take 1 Tablet (5,000 Units total) by mouth Daily  estradioL  (ESTRACE ) 0.01 % (0.1 mg/gram) Vaginal Cream, Apply a pea-sized amount to urethral opening every night for 1 month.  Then reduce to Monday-Wednesday-Friday dosing as directed  estradioL  (VIVELLE -DOT) 0.0375 mg/24 hr Transdermal Patch Semiweekly, APPLY 1 PATCH TOPICALLY TO CLEAN, DRY, AND HAIR-FREE AREA OF THE LOWER STOMACH OR UPPER BUTTOCK AREA TWICE A WEEK (TUESDAY & FRIDAY)  fluticasone propionate (FLONASE) 50 mcg/actuation Nasal Spray, Suspension, Administer 1 Spray into each nostril Daily  lifitegrast  (XIIDRA ) 5 % Ophthalmic Dropperette, Use 1 drop in both eyes twice daily  lifitegrast  (XIIDRA ) 5 % Ophthalmic Dropperette, Instill 1 drop into both eyes twice a day  lipase -protease -amylase , pork, (CREON ) 24,000 units of lipase   Oral Capsule, Delayed Release(E.C.), Take 1 capsule (24,000 units of lipase  total) by mouth every morning, 1 capsule at noon, and 1 capsule every evening. Take with meals.  LORazepam (ATIVAN) 0.5 mg Oral Tablet, 1 Tablet (0.5 mg total)  magnesium hydroxide (MILK OF MAGNESIA ORAL), Take 30 mL by mouth Every night  MIEBO , PF, 100 % Ophthalmic Drops,   pantoprazole  (PROTONIX ) 40 mg Oral Tablet, Delayed Release (E.C.), Take 1 Tablet (40 mg total) by mouth Daily  perfluorohexyloctane , PF, (MIEBO , PF,) 100 % Ophthalmic Drops, Instill 1 drop into both eyes four times a day  progesterone  micronized (PROMETRIUM ) 100 mg Oral Capsule, TAKE 1 CAPSULE BY MOUTH ONCE DAILY AT BEDTIME ON DAYS 1-25 OF CALENDAR MONTH  rifAXIMin  (XIFAXAN ) 550 mg Oral Tablet, Take 1 Tablet (550 mg total) by mouth Three times a day (Patient taking differently: Take 1 Tablet (550 mg total) by mouth Three times a day Taking bid)  rosuvastatin  (CRESTOR ) 5 mg Oral Tablet, Take 1 Tablet (5 mg total) by mouth Every evening     No facility-administered  medications prior to visit.           ROS      OBJECTIVE:  There were no vitals filed for this visit.   Exam  Const  General: cooperative, no acute distress and alert  Resp  Effort & Inspection: able to speak in complete sentences  Psych  Mental Status: mental status grossly normal  Mood: congruent mood  Affect: normal affect  Attitude: cooperative  Insight: insight good  Judgment: judgment good         ICD-10-CM     1. Left hip pain  M25.552         2. 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 Call or return to clinic prn if these symptoms worsen or fail to improve as anticipated.  Meds reviewed as well as labs.  Chart reviewed and updated.   Surgery in  2 weeks  hip replacement Nov  25th    H2 for PT     Will do this last treatment and monitor  for changes  Skin is clear  she has been feeling better   ie abdomen      LOS Determination: Medical Decision Making- Direct audio communication with patient was 12 minutes     Suzen DELENA Asa, PA-C

## 2023-11-22 ENCOUNTER — Other Ambulatory Visit: Payer: Self-pay

## 2023-12-04 HISTORY — PX: HX HIP REPLACEMENT: SHX124

## 2023-12-10 ENCOUNTER — Other Ambulatory Visit: Payer: Self-pay

## 2023-12-10 ENCOUNTER — Other Ambulatory Visit (INDEPENDENT_AMBULATORY_CARE_PROVIDER_SITE_OTHER): Payer: Self-pay | Admitting: Internal Medicine

## 2023-12-10 MED ORDER — PROGESTERONE MICRONIZED 100 MG CAPSULE
ORAL_CAPSULE | ORAL | 1 refills | Status: AC
  Filled 2023-12-10: qty 90, fill #0

## 2023-12-11 ENCOUNTER — Other Ambulatory Visit: Payer: Self-pay

## 2023-12-12 ENCOUNTER — Other Ambulatory Visit: Payer: Self-pay

## 2023-12-16 ENCOUNTER — Other Ambulatory Visit: Payer: Self-pay

## 2023-12-17 ENCOUNTER — Encounter (INDEPENDENT_AMBULATORY_CARE_PROVIDER_SITE_OTHER): Payer: Self-pay | Admitting: Internal Medicine

## 2023-12-17 ENCOUNTER — Ambulatory Visit: Payer: Self-pay | Attending: Internal Medicine | Admitting: Internal Medicine

## 2023-12-17 ENCOUNTER — Other Ambulatory Visit: Payer: Self-pay

## 2023-12-17 VITALS — BP 126/80 | HR 98

## 2023-12-17 DIAGNOSIS — D803 Selective deficiency of immunoglobulin G [IgG] subclasses: Secondary | ICD-10-CM

## 2023-12-17 DIAGNOSIS — R778 Other specified abnormalities of plasma proteins: Secondary | ICD-10-CM

## 2023-12-17 DIAGNOSIS — Z96652 Presence of left artificial knee joint: Secondary | ICD-10-CM

## 2023-12-17 DIAGNOSIS — R042 Hemoptysis: Secondary | ICD-10-CM

## 2023-12-17 DIAGNOSIS — Z96642 Presence of left artificial hip joint: Secondary | ICD-10-CM

## 2023-12-17 DIAGNOSIS — D802 Selective deficiency of immunoglobulin A [IgA]: Secondary | ICD-10-CM

## 2023-12-17 DIAGNOSIS — R7689 Other specified abnormal immunological findings in serum: Secondary | ICD-10-CM

## 2023-12-17 MED ORDER — SULFAMETHOXAZOLE 800 MG-TRIMETHOPRIM 160 MG TABLET: 1.0000 | ORAL_TABLET | Freq: Two times a day (BID) | ORAL | 0 refills | Status: DC

## 2023-12-17 MED ORDER — MUPIROCIN 2 % TOPICAL OINTMENT: TOPICAL_OINTMENT | Freq: Three times a day (TID) | CUTANEOUS | 3 refills | Status: AC

## 2023-12-17 MED ORDER — MUPIROCIN 2 % TOPICAL OINTMENT
TOPICAL_OINTMENT | Freq: Three times a day (TID) | CUTANEOUS | 3 refills | Status: DC
  Filled 2023-12-17: qty 22, 10d supply, fill #0

## 2023-12-17 MED ORDER — ROSUVASTATIN 5 MG TABLET
5.0000 mg | ORAL_TABLET | Freq: Every evening | ORAL | 2 refills | Status: AC
  Filled 2023-12-17: qty 30, 30d supply, fill #0
  Filled 2024-01-10: qty 30, 30d supply, fill #1
  Filled 2024-02-11: qty 30, 30d supply, fill #2

## 2023-12-17 MED ORDER — ESTRADIOL 0.01% (0.1 MG/GRAM) VAGINAL CREAM
TOPICAL_CREAM | VAGINAL | 11 refills | Status: AC
  Filled 2023-12-17: qty 42.5, 90d supply, fill #0

## 2023-12-17 MED ORDER — ESTRADIOL 0.0375 MG/24 HR SEMIWEEKLY TRANSDERMAL PATCH
MEDICATED_PATCH | TRANSDERMAL | 4 refills | Status: AC
  Filled 2023-12-17: qty 24, 84d supply, fill #0

## 2023-12-17 NOTE — Progress Notes (Unsigned)
 INTERNAL MEDICINE, BUILDING A  [address]  [place] [address]  Operated by [facility]  Transitional Care Management Note    Name: TIENNA BIENKOWSKI MRN:  [id number]   Date: 12/17/2023 Age: 68 y.o.     Chief Complaint: Hospital Follow Up [facility] follow up) and Hospital Discharge Transition       SUBJECTIVE:  DANELLY HASSINGER is a 68 y.o. female presenting today for follow-up after being discharged. The main problem requiring admission was admitted for left total knee replacement.   Pt had surgery and it went well      Pt doing well with therapy    To see       Co dryness sinus and allergies and very dry in her mouth    Pt  has been coughing up mucus all before surgery  Sept  and no worse    Pt has been on Flonase and  benadryl             OBJECTIVE:   BP 126/80 (Site: Left Arm, Patient Position: Sitting)   Pulse 98   SpO2 97%     Exam-AMB  Const  General: cooperative, healthy appearing and no acute distress  Orientation/Consciousness: patient oriented x3  HENMT  Ears: hearing grossly normal bilaterally  Eyes  General: appearance normal, both eyes and all related structures  Conjunctivae: conjunctivae normal  Sclera: sclerae normal  EOM: EOM intact bilaterally  Neck  Neck: normal visual inspection and no lymphadenopathy  Thyroid : thyroid  normal  Carotids: no bruits  Resp  Effort & Inspection: normal respiratory effort  Auscultation: clear to auscultation bilaterally, no crackles, no rales, no rhonchi and no wheezes  Cardio  Jugular venous pressure: no JVD  Rate: regular rate  Rhythm: regular rhythm  Heart Sounds: S1 normal, S2 normal, no click, no murmurs and no rubs  Bruits: no carotid bruits  GI  Inspection: Yes normal to inspection  Palpation: soft, no hepatosplenomegaly, no guarding, no masses and nontender  Auscultation: normal bowel sounds  Neuro  General: patient oriented x3  Extrem  General: normal to inspection, normal exam except as noted, clubbing, cyanosis or edema noted, normal  gait and other  Psych  Mental Status: mental status grossly normal  Mood: congruent mood  Affect: normal affect  Insight: insight good  Judgment: judgment good        Transition of Care Contact Information  Discharge date: Discharge Date: Not Found11/26/25  Transition Facility Type--Hospital (Inpatient or Observation)  Facility Name--[facility] Interactive Contact(s):  Clinical Staff Name/Role who contacted--becky     Data Reviewed  Medication Reconciliation completed    Assessment & Plan  History of left knee replacement    IgG deficiency (CMS HCC)    IgA deficiency (CMS HCC)    Low immunoglobulin level    Abnormal SPEP       Other transition actions (Optional) -: Discharge documentation was reviewed and Pending tests or treatments were discussed with the patient-family-caregiver     2 week fu with surgeon coming up   Dr Parks   [facility]   Pt is in  therapy [facility]    4 x  last  week  and d  3 x this week   support hose  for prevention    Flexion limited   Pt on robaxin   Q6 hrs  and tylenol    prn  off percocet       Pt took percocet   8 days  pt has been elevating and  ice  and little swelling      Pt walking on walker and doing well      Bactroban  to nares      Bactrim   for  sinus and  there is redness at  bottom of left anterior scar   Pt out of work 12 weeks      Labs in fu    may need  Iv infusion        Suzen DELENA Asa, PA-C

## 2023-12-18 ENCOUNTER — Other Ambulatory Visit: Payer: Self-pay

## 2023-12-18 LAB — CBC
HCT: 31.5 % (ref 31.2–41.9)
HGB: 10.9 g/dL (ref 10.9–14.3)
MCH: 30.9 pg (ref 24.7–32.8)
MCHC: 34.7 g/dL (ref 32.3–35.6)
MCV: 89.2 fL (ref 75.5–95.3)
MPV: 7.3 fL — ABNORMAL LOW (ref 7.9–10.8)
PLATELETS: 480 x10ˆ3/uL — ABNORMAL HIGH (ref 140–440)
RBC: 3.53 x10ˆ6/uL — ABNORMAL LOW (ref 3.63–4.92)
RDW: 14.7 % (ref 12.3–17.7)
WBC: 7.7 x10ˆ3/uL (ref 3.8–11.8)

## 2023-12-18 LAB — COMPREHENSIVE METABOLIC PANEL, NON-FASTING
ALBUMIN/GLOBULIN RATIO: 1.6 — ABNORMAL HIGH (ref 0.8–1.4)
ALBUMIN: 4.1 g/dL (ref 3.5–5.7)
ALKALINE PHOSPHATASE: 65 U/L (ref 34–104)
ALT (SGPT): 51 U/L (ref 7–52)
ANION GAP: 7 mmol/L (ref 4–13)
AST (SGOT): 32 U/L (ref 13–39)
BILIRUBIN TOTAL: 0.4 mg/dL (ref 0.3–1.0)
BUN/CREA RATIO: 40 — ABNORMAL HIGH (ref 6–22)
BUN: 35 mg/dL — ABNORMAL HIGH (ref 7–25)
CALCIUM, CORRECTED: 8.9 mg/dL (ref 8.9–10.8)
CALCIUM: 9 mg/dL (ref 8.6–10.3)
CHLORIDE: 99 mmol/L (ref 98–107)
CO2 TOTAL: 29 mmol/L (ref 21–31)
CREATININE: 0.87 mg/dL (ref 0.60–1.30)
ESTIMATED GFR: 73 mL/min/1.73mˆ2 (ref >59)
GLOBULIN: 2.5 (ref 2.0–3.5)
GLUCOSE: 100 mg/dL (ref 74–109)
OSMOLALITY, CALCULATED: 278 mosm/kg (ref 270–290)
POTASSIUM: 3.8 mmol/L (ref 3.5–5.1)
PROTEIN TOTAL: 6.6 g/dL (ref 6.4–8.9)
SODIUM: 135 mmol/L — ABNORMAL LOW (ref 136–145)

## 2023-12-19 ENCOUNTER — Ambulatory Visit

## 2023-12-19 ENCOUNTER — Other Ambulatory Visit: Payer: Self-pay

## 2023-12-19 ENCOUNTER — Other Ambulatory Visit (INDEPENDENT_AMBULATORY_CARE_PROVIDER_SITE_OTHER): Payer: Self-pay | Admitting: Internal Medicine

## 2023-12-19 DIAGNOSIS — R042 Hemoptysis: Secondary | ICD-10-CM

## 2023-12-19 LAB — IMMUNOGLOBULIN A (IGA), SERUM: IMMUNOGLOBULIN A (IGA): 73 mg/dL — ABNORMAL LOW (ref 85–499)

## 2023-12-19 LAB — IMMUNOGLOBULIN M (IGM), SERUM: IMMUNOGLOBULIN M (IGM): 135 mg/dL (ref 35–242)

## 2023-12-19 LAB — IMMUNOGLOBULIN G (IGG), SERUM: IMMUNOGLOBULIN G (IGG): 661 mg/dL (ref 610–1616)

## 2023-12-20 ENCOUNTER — Other Ambulatory Visit: Payer: Self-pay

## 2023-12-20 ENCOUNTER — Ambulatory Visit (INDEPENDENT_AMBULATORY_CARE_PROVIDER_SITE_OTHER): Payer: Self-pay | Admitting: Internal Medicine

## 2023-12-20 LAB — RESPIRATORY CULTURE AND GRAM STAIN (PERFORMABLE): RESPIRATORY CULTURE: NORMAL

## 2023-12-20 MED ORDER — DOXYCYCLINE HYCLATE 100 MG TABLET: 100.0000 mg | ORAL_TABLET | Freq: Two times a day (BID) | ORAL | 0 refills | Status: AC

## 2023-12-21 DIAGNOSIS — R042 Hemoptysis: Secondary | ICD-10-CM

## 2023-12-21 LAB — CYTOPATHOLOGY, NON GYN

## 2023-12-24 ENCOUNTER — Encounter (HOSPITAL_COMMUNITY): Payer: Self-pay | Admitting: Internal Medicine

## 2023-12-24 ENCOUNTER — Other Ambulatory Visit (HOSPITAL_COMMUNITY): Payer: Self-pay | Admitting: Internal Medicine

## 2023-12-24 ENCOUNTER — Other Ambulatory Visit (INDEPENDENT_AMBULATORY_CARE_PROVIDER_SITE_OTHER): Payer: Self-pay | Admitting: Internal Medicine

## 2023-12-24 ENCOUNTER — Ambulatory Visit (HOSPITAL_COMMUNITY): Payer: Self-pay

## 2023-12-27 ENCOUNTER — Encounter (HOSPITAL_COMMUNITY): Payer: Self-pay | Admitting: Internal Medicine

## 2023-12-28 ENCOUNTER — Ambulatory Visit
Admission: RE | Admit: 2023-12-28 | Discharge: 2023-12-28 | Disposition: A | Discharge status: Home / Self Care | Source: Ambulatory Visit | Attending: Internal Medicine | Admitting: Internal Medicine

## 2023-12-28 ENCOUNTER — Other Ambulatory Visit: Payer: Self-pay

## 2023-12-28 VITALS — BP 137/78 | HR 90 | Temp 98.3°F | Resp 18 | Wt 127.6 lb

## 2023-12-28 DIAGNOSIS — D849 Immunodeficiency, unspecified: Secondary | ICD-10-CM | POA: Insufficient documentation

## 2023-12-28 DIAGNOSIS — R771 Abnormality of globulin: Secondary | ICD-10-CM | POA: Insufficient documentation

## 2023-12-28 MED ORDER — DIPHENHYDRAMINE 50 MG/ML INJECTION SOLUTION: 50.0000 mg | Freq: Once | INTRAMUSCULAR | Status: DC | PRN

## 2023-12-28 MED ORDER — FAMOTIDINE (PF) 20 MG/2 ML INTRAVENOUS SOLUTION: 20.0000 mg | Freq: Once | INTRAVENOUS | Status: DC | PRN

## 2023-12-28 MED ORDER — MEPERIDINE (PF) 25 MG/ML INJECTION SOLUTION: 12.5000 mg | Freq: Once | INTRAMUSCULAR | Status: DC | PRN

## 2023-12-28 MED ORDER — METHYLPREDNISOLONE SOD SUCC 125 MG SOLUTION FOR INJECTION WRAPPER
60.0000 mg | Freq: Once | INTRAVENOUS | Status: AC
  Administered 2023-12-28: 60 mg via INTRAVENOUS
  Filled 2023-12-28: qty 2

## 2023-12-28 MED ORDER — ACETAMINOPHEN 325 MG TABLET
650.0000 mg | ORAL_TABLET | Freq: Once | ORAL | Status: DC
  Administered 2023-12-28: 0 mg via ORAL
  Filled 2023-12-28 (×2): qty 2

## 2023-12-28 MED ORDER — EPINEPHRINE 1 MG/ML (1 ML) INJECTION SOLUTION: 0.3000 mg | Freq: Once | INTRAMUSCULAR | Status: DC | PRN

## 2023-12-28 MED ORDER — ALBUTEROL SULFATE HFA 90 MCG/ACTUATION AEROSOL INHALER - RN: 2.0000 | Freq: Once | RESPIRATORY_TRACT | Status: DC | PRN

## 2023-12-28 MED ORDER — SODIUM CHLORIDE 0.9 % IV BOLUS: 500.0000 mL | INJECTION | Freq: Once | Status: DC | PRN

## 2023-12-28 MED ORDER — ALBUTEROL SULFATE 2.5 MG/3 ML (0.083 %) SOLUTION FOR NEBULIZATION: 2.5000 mg | INHALATION_SOLUTION | Freq: Once | RESPIRATORY_TRACT | Status: DC | PRN

## 2023-12-28 MED ORDER — DIPHENHYDRAMINE 50 MG CAPSULE
50.0000 mg | ORAL_CAPSULE | Freq: Once | ORAL | Status: DC
  Administered 2023-12-28: 0 mg via ORAL
  Filled 2023-12-28: qty 1

## 2023-12-28 MED ORDER — HYDROCORTISONE SOD SUCCINATE 100 MG/2 ML VIAL WRAPPER: 100.0000 mg | Freq: Once | INTRAMUSCULAR | Status: DC | PRN

## 2023-12-28 MED ORDER — DIPHENHYDRAMINE 50 MG/ML INJECTION SOLUTION: 25.0000 mg | Freq: Once | INTRAMUSCULAR | Status: DC | PRN

## 2023-12-28 MED ORDER — IMMUNE GLOB,GAMM(IGG) 10 %-PRO-IGA 0 TO 50 MCG/ML INTRAVENOUS SOLUTION
400.0000 mg/kg | Freq: Once | INTRAVENOUS | Status: AC
  Administered 2023-12-28: 0 mg via INTRAVENOUS
  Administered 2023-12-28: 25000 mg via INTRAVENOUS
  Filled 2023-12-28: qty 250

## 2023-12-28 NOTE — Nurses Notes (Signed)
 1110 Pt to OP Onc unit via wheelchair for tx. Pt states she had a L hip replacement. VS obtained.  IV started L FA. Excellent blood return noted. Rexene Lipps, RN  Patient Assessment/Symptom Management Patient Has No MD Appointment Today   Key: (+) Symptom present           (-)  Symptom not present If Symptom is Positive(+) a Nursing Note is required   Edema -   Uncontrolled Nausea -   Vomiting -   Inability to eat/drink -   Mouth Sores -   Diarrhea -   Constipation (? Last BM) -   Fatigue that interferes with ADL's -   Numbness/Tingling -change -   Other -   Fever/Signs & Symptoms of infection -   Nurse Initials TL RN   1127 Pt took own home Benadryl  elixer 25 mg po and Tylenol  650 mg po. Solu-Medrol  60 mg IVP given. Rexene Lipps, RN  [phone] Privigen  10% 25 G IV infusion started. To be titrated per order. Call bell in reach. Rexene Lipps, RN  [phone] No adverse reaction noted.Privigen  rate increased to 58 ml/hr. Rexene Lipps, RN  1250 Privigen  rate increased to 87 ml/hr.  Rexene Lipps, RN  1320 Privigen  rate increased to 116 ml/hr. Rexene Lipps, RN  1350 Privigen  rate increased to 145 ml/hr. Pt denies c/o. Rexene Lipps, RN  [phone] Privigen  infusion completed. Line flushed with NS. VS obtained. Pt tolerated tx without difficulty. Keshara Kiger, RN  1500 IV d/c'ed. Cath intact. Site clear. Drsg with Coban wrap applied. Pt to BR. Rexene Lipps, RN  1515 Pt left OP Onc unit via wheelchair with volunteer to assist. No adverse reaction noted. Rexene Lipps, RN

## 2023-12-30 ENCOUNTER — Encounter (INDEPENDENT_AMBULATORY_CARE_PROVIDER_SITE_OTHER): Payer: Self-pay | Admitting: Internal Medicine

## 2023-12-31 ENCOUNTER — Other Ambulatory Visit: Attending: Internal Medicine | Admitting: Internal Medicine

## 2023-12-31 ENCOUNTER — Encounter (HOSPITAL_COMMUNITY): Payer: Self-pay | Admitting: Internal Medicine

## 2023-12-31 ENCOUNTER — Other Ambulatory Visit (INDEPENDENT_AMBULATORY_CARE_PROVIDER_SITE_OTHER): Payer: Self-pay

## 2023-12-31 ENCOUNTER — Other Ambulatory Visit: Payer: Self-pay

## 2023-12-31 ENCOUNTER — Encounter (INDEPENDENT_AMBULATORY_CARE_PROVIDER_SITE_OTHER): Payer: Self-pay | Admitting: Internal Medicine

## 2023-12-31 ENCOUNTER — Telehealth (INDEPENDENT_AMBULATORY_CARE_PROVIDER_SITE_OTHER): Payer: Self-pay | Admitting: Internal Medicine

## 2023-12-31 ENCOUNTER — Other Ambulatory Visit (INDEPENDENT_AMBULATORY_CARE_PROVIDER_SITE_OTHER): Payer: Self-pay | Admitting: Internal Medicine

## 2023-12-31 DIAGNOSIS — J069 Acute upper respiratory infection, unspecified: Secondary | ICD-10-CM

## 2023-12-31 DIAGNOSIS — R042 Hemoptysis: Secondary | ICD-10-CM

## 2023-12-31 MED ORDER — LEVOFLOXACIN 750 MG TABLET
750.0000 mg | ORAL_TABLET | Freq: Every day | ORAL | 0 refills | Status: AC
  Filled 2023-12-31: qty 7, 7d supply, fill #0

## 2024-01-01 ENCOUNTER — Telehealth (INDEPENDENT_AMBULATORY_CARE_PROVIDER_SITE_OTHER): Payer: Self-pay | Admitting: Internal Medicine

## 2024-01-01 ENCOUNTER — Other Ambulatory Visit: Payer: Self-pay

## 2024-01-01 DIAGNOSIS — R042 Hemoptysis: Secondary | ICD-10-CM

## 2024-01-01 DIAGNOSIS — J069 Acute upper respiratory infection, unspecified: Secondary | ICD-10-CM

## 2024-01-01 LAB — SPUTUM SCREEN

## 2024-01-02 ENCOUNTER — Other Ambulatory Visit: Payer: Self-pay

## 2024-01-07 ENCOUNTER — Other Ambulatory Visit: Payer: Self-pay

## 2024-01-08 ENCOUNTER — Other Ambulatory Visit: Payer: Self-pay

## 2024-01-11 ENCOUNTER — Other Ambulatory Visit: Payer: Self-pay

## 2024-01-14 ENCOUNTER — Other Ambulatory Visit: Payer: Self-pay

## 2024-01-15 ENCOUNTER — Other Ambulatory Visit (INDEPENDENT_AMBULATORY_CARE_PROVIDER_SITE_OTHER): Payer: Self-pay | Admitting: Internal Medicine

## 2024-01-15 ENCOUNTER — Other Ambulatory Visit: Attending: Internal Medicine | Admitting: Internal Medicine

## 2024-01-15 ENCOUNTER — Encounter (INDEPENDENT_AMBULATORY_CARE_PROVIDER_SITE_OTHER): Payer: Self-pay | Admitting: Internal Medicine

## 2024-01-15 ENCOUNTER — Telehealth (INDEPENDENT_AMBULATORY_CARE_PROVIDER_SITE_OTHER): Payer: Self-pay | Admitting: Internal Medicine

## 2024-01-15 DIAGNOSIS — J069 Acute upper respiratory infection, unspecified: Secondary | ICD-10-CM

## 2024-01-15 NOTE — Nursing Note (Signed)
 This should be a very good sample.  Taken-yacked-this morning 01-15-2024 Tuesday at 8 am.   Taken directly from me to container(not from tissue to  scabby thins).  This is the best it has been int months.  I continue to have this at least twice a day sometimes three.  My night are to dry  improved.  Drink water use nasal moisturizing spray premium saline every 3 to 4 hours 24/7 (including nights).   Night i am also using equate sore throat phenol 1.4% oral anti anesthetic/analgesic spray.   Started antibiotic on Monday 01/07/2024  took last pill on Sunday 1/42026.   Please send to lab to make sure the bacteria and infection is all gone.     Sonya Parks   01-15-2024

## 2024-01-16 ENCOUNTER — Other Ambulatory Visit: Payer: Self-pay

## 2024-01-17 ENCOUNTER — Ambulatory Visit (INDEPENDENT_AMBULATORY_CARE_PROVIDER_SITE_OTHER): Payer: Self-pay | Admitting: Internal Medicine

## 2024-01-17 LAB — RESPIRATORY CULTURE AND GRAM STAIN (PERFORMABLE)

## 2024-01-18 ENCOUNTER — Telehealth (INDEPENDENT_AMBULATORY_CARE_PROVIDER_SITE_OTHER): Payer: Self-pay | Admitting: Internal Medicine

## 2024-01-18 MED ORDER — FLUCONAZOLE 100 MG TABLET: 100.0000 mg | ORAL_TABLET | Freq: Every day | ORAL | 0 refills | Status: AC

## 2024-01-18 MED ORDER — NYSTATIN 100,000 UNIT/ML ORAL SUSPENSION: 5.0000 mL | Freq: Four times a day (QID) | ORAL | 0 refills | Status: AC

## 2024-01-18 NOTE — Telephone Encounter (Signed)
 Pe kim   mininial yeast   diflucan   100 daily x 3 days and nystation 5 cc qid   patient notifed

## 2024-01-22 ENCOUNTER — Encounter (HOSPITAL_COMMUNITY): Payer: Self-pay | Admitting: Internal Medicine

## 2024-01-24 ENCOUNTER — Other Ambulatory Visit: Payer: Self-pay

## 2024-01-24 ENCOUNTER — Encounter (HOSPITAL_COMMUNITY): Payer: Self-pay | Admitting: Internal Medicine

## 2024-01-24 ENCOUNTER — Other Ambulatory Visit (INDEPENDENT_AMBULATORY_CARE_PROVIDER_SITE_OTHER): Payer: Self-pay | Admitting: Internal Medicine

## 2024-01-24 ENCOUNTER — Ambulatory Visit: Attending: Internal Medicine | Admitting: Internal Medicine

## 2024-01-24 ENCOUNTER — Encounter (INDEPENDENT_AMBULATORY_CARE_PROVIDER_SITE_OTHER): Payer: Self-pay | Admitting: Internal Medicine

## 2024-01-24 VITALS — HR 115 | Temp 97.6°F

## 2024-01-24 DIAGNOSIS — J069 Acute upper respiratory infection, unspecified: Secondary | ICD-10-CM | POA: Insufficient documentation

## 2024-01-24 DIAGNOSIS — R042 Hemoptysis: Secondary | ICD-10-CM | POA: Insufficient documentation

## 2024-01-24 DIAGNOSIS — D802 Selective deficiency of immunoglobulin A [IgA]: Secondary | ICD-10-CM | POA: Insufficient documentation

## 2024-01-24 DIAGNOSIS — J3489 Other specified disorders of nose and nasal sinuses: Secondary | ICD-10-CM | POA: Insufficient documentation

## 2024-01-24 DIAGNOSIS — D803 Selective deficiency of immunoglobulin G [IgG] subclasses: Secondary | ICD-10-CM | POA: Insufficient documentation

## 2024-01-24 DIAGNOSIS — J329 Chronic sinusitis, unspecified: Secondary | ICD-10-CM | POA: Insufficient documentation

## 2024-01-24 DIAGNOSIS — A498 Other bacterial infections of unspecified site: Secondary | ICD-10-CM | POA: Insufficient documentation

## 2024-01-24 DIAGNOSIS — K219 Gastro-esophageal reflux disease without esophagitis: Secondary | ICD-10-CM | POA: Insufficient documentation

## 2024-01-24 MED ORDER — AREXVY (PF) 120 MCG/0.5 ML IM SUSPENSION: 0.5000 mL | INHALATION_SUSPENSION | Freq: Once | INTRAMUSCULAR | 0 refills | Status: AC

## 2024-01-24 MED ORDER — MONTELUKAST 10 MG TABLET: 10.0000 mg | ORAL_TABLET | Freq: Every evening | ORAL | 3 refills | Status: DC

## 2024-01-24 MED ORDER — VOQUEZNA 20 MG TABLET: 1.0000 | ORAL_TABLET | Freq: Every day | ORAL | 3 refills | Status: DC

## 2024-01-24 NOTE — Nursing Note (Signed)
 Pt states that she is doing much better but she is still having issue states that her throat feels like it will close of its not sore just dry and getting stuff out her nose did have a nose bleed yesterday but she thinks its from a scab pt has been on three antibiotics she did bring in a sputum in with her

## 2024-01-24 NOTE — Progress Notes (Unsigned)
 INTERNAL MEDICINE, BUILDING A  [address]  [place] [address]  Operated by [facility]  History and Physical    Name: Sonya Parks MRN:  [id number]   Date: 01/24/2024 DOB:  05-13-1955 (69 y.o.)         Name: Sonya Parks                       Date of Birth: 10-21-1955   MRN:  [id number]                         Date of visit: 01/24/2024     PCP: Suzen DELENA Asa, PA-C     Subjective  Sonya Parks is a 69 y.o. year old female who presents for Follow Up (Pt states that she is doing much better but she is still having issue states that her throat feels like it will close of its not sore just dry and getting stuff out her nose did have a nose bleed yesterday but she thinks its from a scab pt has been on three antibiotics she did bring in a sputum in with her )   to clinic.  No specialty comments available.   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    B12 deficiency 10/09/2021    Mixed hyperlipidemia 09/21/2021    Esophageal reflux 09/21/2021    Vitamin D  deficiency 09/21/2021    Degenerative arthritis 09/21/2021      Current Outpatient Medications   Medication Sig    acetaminophen  (TYLENOL  ARTHRITIS ORAL) Take by mouth    aspirin 81 mg Oral Tablet, Chewable Chew 1 Tablet (81 mg total) Twice daily    atomoxetine  (STRATTERA ) 60 mg Oral Capsule Take 1 Capsule (60 mg total) by mouth Daily    buPROPion  (WELLBUTRIN  XL) 300 mg extended  release 24 hr tablet TAKE 1 TABLET BY MOUTH EVERY MORNING    busPIRone  (BUSPAR ) 15 mg Oral Tablet Take 1 Tablet (15 mg total) by mouth Twice daily    celecoxib  (CELEBREX ) 200 mg Oral Capsule Take 1 Capsule (200 mg total) by mouth Twice daily    cholecalciferol, Vitamin D3, (VITAMIN D -3) 125 mcg (5,000 unit) Oral Tablet Take 1 Tablet (5,000 Units total) by mouth Daily    estradioL  (ESTRACE ) 0.01 % (0.1 mg/gram) Vaginal Cream Apply a pea-sized amount to urethral opening every night for 1 month.  Then reduce to Monday-Wednesday-Friday dosing as directed    estradioL  (VIVELLE -DOT) 0.0375 mg/24 hr Transdermal Patch Semiweekly APPLY 1 PATCH TOPICALLY TO CLEAN, DRY, AND HAIR-FREE AREA OF THE LOWER STOMACH OR UPPER BUTTOCK AREA TWICE A WEEK (TUESDAY & FRIDAY)    fluconazole  (DIFLUCAN ) 100 mg Oral Tablet Take 1 Tablet (100 mg total) by mouth Daily (Patient not taking: Reported on 01/24/2024)    fluticasone propionate (FLONASE) 50 mcg/actuation Nasal  Spray, Suspension Administer 1 Spray into each nostril Daily    lifitegrast  (XIIDRA ) 5 % Ophthalmic Dropperette Use 1 drop in both eyes twice daily    lifitegrast  (XIIDRA ) 5 % Ophthalmic Dropperette Instill 1 drop into both eyes twice a day    lipase -protease -amylase , pork, (CREON ) 24,000 units of lipase  Oral Capsule, Delayed Release(E.C.) Take 1 capsule (24,000 units of lipase  total) by mouth every morning, 1 capsule at noon, and 1 capsule every evening. Take with meals.    LORazepam (ATIVAN) 0.5 mg Oral Tablet 1 Tablet (0.5 mg total)    magnesium hydroxide (MILK OF MAGNESIA ORAL) Take 30 mL by mouth Every night    MIEBO , PF, 100 % Ophthalmic Drops     montelukast  (SINGULAIR ) 10 mg Oral Tablet Take 1 Tablet (10 mg total) by mouth Every evening    nystatin  (MYCOSTATIN ) 100,000 unit/mL Oral Suspension Take 5 mL by mouth Four times a day    perfluorohexyloctane , PF, (MIEBO , PF,) 100 % Ophthalmic Drops Instill 1 drop into both eyes four times a day    progesterone   micronized (PROMETRIUM ) 100 mg Oral Capsule TAKE 1 CAPSULE BY MOUTH ONCE DAILY AT BEDTIME ON DAYS 1-25 OF CALENDAR MONTH    rifAXIMin  (XIFAXAN ) 550 mg Oral Tablet Take 1 Tablet (550 mg total) by mouth Three times a day    rosuvastatin  (CRESTOR ) 5 mg Oral Tablet Take 1 Tablet (5 mg total) by mouth Every evening    vonoprazan (VOQUEZNA ) 20 mg Oral Tablet Take 1 Tablet (20 mg total) by mouth Daily Indications: gastroesophageal reflux disease, failed PPI and H2  blocker            Fu Visit   Fu recurrent  congestion and  bloody drainage   Nose bleed yesterday    Recent  culture  rare yeast  and before that   Klebsiella aerogenes (formerly Enterobacter aerogenes)  Pt has been on  3 antibiotics and diflucan     along with nystatin       Co feels like throat  tight at times  ? Due to Dryness and it has woken her up at  night       Fu GERD and recently  having  recurrent  heartburn   despite medications     Was on voquenza in the past and had better success will restart at   20 mg prior authorization is needed               REVIEW OF SYSTEMS:   Review of Systems  Review of Systems have been reviewed    Objective: Pulse (!) 115   Temp 36.4 C (97.6 F)   SpO2 98%                PHYSICAL EXAM  Physical Exam  Gen: NAD. Alert.   Heart RRR   Orders Placed This Encounter    SPUTUM SCREEN    RESPIRATORY CULTURE AND GRAM STAIN (PERFORMABLE)    Referral to OTOLARYNGOLOGY - [place] - LENTNER, WEITZEL    RSVPreF3 antigen-AS01E, PF, (AREXVY , PF,) 120 mcg/0.5 mL IntraMUSCULAR Suspension for Reconstitution      Assessment & Plan  Upper respiratory tract infection, unspecified type    Hemoptysis    Recurrent sinus infections    Infection due to Klebsiella aerogenes    Sinus drainage

## 2024-01-25 ENCOUNTER — Other Ambulatory Visit: Payer: Self-pay

## 2024-01-25 ENCOUNTER — Encounter (HOSPITAL_COMMUNITY): Payer: Self-pay

## 2024-01-25 ENCOUNTER — Ambulatory Visit
Admission: RE | Admit: 2024-01-25 | Discharge: 2024-01-25 | Disposition: A | Discharge status: Home / Self Care | Payer: Self-pay | Source: Ambulatory Visit | Attending: Internal Medicine | Admitting: Internal Medicine

## 2024-01-25 ENCOUNTER — Encounter (HOSPITAL_COMMUNITY): Payer: Self-pay | Admitting: Internal Medicine

## 2024-01-25 VITALS — BP 129/66 | HR 93 | Temp 98.8°F | Resp 18

## 2024-01-25 DIAGNOSIS — D849 Immunodeficiency, unspecified: Secondary | ICD-10-CM | POA: Insufficient documentation

## 2024-01-25 DIAGNOSIS — R771 Abnormality of globulin: Secondary | ICD-10-CM | POA: Insufficient documentation

## 2024-01-25 MED ORDER — FAMOTIDINE (PF) 20 MG/2 ML INTRAVENOUS SOLUTION: 20.0000 mg | Freq: Once | INTRAVENOUS | Status: DC | PRN

## 2024-01-25 MED ORDER — SODIUM CHLORIDE 0.9 % IV BOLUS: 500.0000 mL | INJECTION | Freq: Once | Status: DC | PRN

## 2024-01-25 MED ORDER — METHYLPREDNISOLONE SOD SUCC 125 MG SOLUTION FOR INJECTION WRAPPER
60.0000 mg | Freq: Once | INTRAVENOUS | Status: AC
  Administered 2024-01-25: 60 mg via INTRAVENOUS
  Filled 2024-01-25: qty 2

## 2024-01-25 MED ORDER — VOQUEZNA 20 MG TABLET
1.0000 | ORAL_TABLET | Freq: Every day | ORAL | 3 refills | Status: AC
  Filled 2024-01-25 (×4): qty 30, 30d supply, fill #0

## 2024-01-25 MED ORDER — ALBUTEROL SULFATE HFA 90 MCG/ACTUATION AEROSOL INHALER - RN: 2.0000 | Freq: Once | RESPIRATORY_TRACT | Status: DC | PRN

## 2024-01-25 MED ORDER — DIPHENHYDRAMINE 50 MG/ML INJECTION SOLUTION: 50.0000 mg | Freq: Once | INTRAMUSCULAR | Status: DC | PRN

## 2024-01-25 MED ORDER — MONTELUKAST 10 MG TABLET
10.0000 mg | ORAL_TABLET | Freq: Every evening | ORAL | 3 refills | Status: AC
  Filled 2024-01-25 (×3): qty 30, 30d supply, fill #0

## 2024-01-25 MED ORDER — HYDROCORTISONE SOD SUCCINATE 100 MG/2 ML VIAL WRAPPER: 100.0000 mg | Freq: Once | INTRAMUSCULAR | Status: DC | PRN

## 2024-01-25 MED ORDER — DIPHENHYDRAMINE 50 MG/ML INJECTION SOLUTION: 25.0000 mg | Freq: Once | INTRAMUSCULAR | Status: DC | PRN

## 2024-01-25 MED ORDER — IMMUNE GLOB,GAMM(IGG) 10 %-PRO-IGA 0 TO 50 MCG/ML INTRAVENOUS SOLUTION
400.0000 mg/kg | Freq: Once | INTRAVENOUS | Status: AC
  Administered 2024-01-25: 0 mg via INTRAVENOUS
  Administered 2024-01-25: 25000 mg via INTRAVENOUS
  Filled 2024-01-25: qty 250

## 2024-01-25 MED ORDER — MEPERIDINE (PF) 25 MG/ML INJECTION SOLUTION: 12.5000 mg | Freq: Once | INTRAMUSCULAR | Status: DC | PRN

## 2024-01-25 MED ORDER — DIPHENHYDRAMINE 50 MG CAPSULE
50.0000 mg | ORAL_CAPSULE | Freq: Once | ORAL | Status: DC
  Administered 2024-01-25: 0 mg via ORAL

## 2024-01-25 MED ORDER — ALBUTEROL SULFATE 2.5 MG/3 ML (0.083 %) SOLUTION FOR NEBULIZATION: 2.5000 mg | INHALATION_SOLUTION | Freq: Once | RESPIRATORY_TRACT | Status: DC | PRN

## 2024-01-25 MED ORDER — ACETAMINOPHEN 325 MG TABLET
650.0000 mg | ORAL_TABLET | Freq: Once | ORAL | Status: DC
  Administered 2024-01-25: 0 mg via ORAL

## 2024-01-25 MED ORDER — EPINEPHRINE 1 MG/ML (1 ML) INJECTION SOLUTION: 0.3000 mg | Freq: Once | INTRAMUSCULAR | Status: DC | PRN

## 2024-01-25 NOTE — Nurses Notes (Signed)
 9187- Patient arrived via ambulation. IV started.  9140- Pt stated that she had already took elixer 25 Benadryl  and 650 PO Tylenol . Gave 60 of IV solu-medrol .  0913- 10% Privigen  infusion started. Titrated per order.  0945- increase of Privigen   1015- increase of Privigen   1047- increase of Privigen   1117- increase of Privigen   1205- infusion completed. Patient tolerated infusion with no complaints.   1215- IV accesses removed, catheter intact and pressure dressing applied.  1220- Patient left floor via ambulation.

## 2024-01-27 ENCOUNTER — Encounter (INDEPENDENT_AMBULATORY_CARE_PROVIDER_SITE_OTHER): Payer: Self-pay | Admitting: Internal Medicine

## 2024-01-27 LAB — RESPIRATORY CULTURE AND GRAM STAIN (PERFORMABLE): RESPIRATORY CULTURE: NORMAL

## 2024-01-27 NOTE — Assessment & Plan Note (Signed)
 Stop Protonix  as pt has break through symptoms  and restart  voquezna  20 mg

## 2024-01-28 ENCOUNTER — Other Ambulatory Visit: Payer: Self-pay

## 2024-01-29 ENCOUNTER — Other Ambulatory Visit: Payer: Self-pay

## 2024-02-12 ENCOUNTER — Other Ambulatory Visit: Payer: Self-pay

## 2024-02-18 ENCOUNTER — Ambulatory Visit (INDEPENDENT_AMBULATORY_CARE_PROVIDER_SITE_OTHER): Payer: Self-pay | Admitting: OTOLARYNGOLOGY

## 2024-05-19 ENCOUNTER — Ambulatory Visit (INDEPENDENT_AMBULATORY_CARE_PROVIDER_SITE_OTHER): Payer: Self-pay | Admitting: Internal Medicine

## 2024-05-20 ENCOUNTER — Ambulatory Visit (INDEPENDENT_AMBULATORY_CARE_PROVIDER_SITE_OTHER): Payer: Self-pay | Admitting: Internal Medicine
